# Patient Record
Sex: Female | Born: 1985
Health system: Southern US, Community
[De-identification: ages and names within clinical notes are randomized; demographics above are authoritative.]

## PROBLEM LIST (undated history)

## (undated) DIAGNOSIS — E119 Type 2 diabetes mellitus without complications: Secondary | ICD-10-CM

## (undated) DIAGNOSIS — A6000 Herpesviral infection of urogenital system, unspecified: Secondary | ICD-10-CM

## (undated) DIAGNOSIS — F32A Depression, unspecified: Secondary | ICD-10-CM

## (undated) DIAGNOSIS — R87619 Unspecified abnormal cytological findings in specimens from cervix uteri: Secondary | ICD-10-CM

## (undated) DIAGNOSIS — N12 Tubulo-interstitial nephritis, not specified as acute or chronic: Secondary | ICD-10-CM

## (undated) DIAGNOSIS — R011 Cardiac murmur, unspecified: Secondary | ICD-10-CM

## (undated) DIAGNOSIS — A5609 Other chlamydial infection of lower genitourinary tract: Secondary | ICD-10-CM

## (undated) DIAGNOSIS — T7840XA Allergy, unspecified, initial encounter: Secondary | ICD-10-CM

## (undated) DIAGNOSIS — I10 Essential (primary) hypertension: Secondary | ICD-10-CM

## (undated) DIAGNOSIS — J45909 Unspecified asthma, uncomplicated: Secondary | ICD-10-CM

## (undated) DIAGNOSIS — F329 Major depressive disorder, single episode, unspecified: Secondary | ICD-10-CM

## (undated) DIAGNOSIS — A599 Trichomoniasis, unspecified: Secondary | ICD-10-CM

## (undated) DIAGNOSIS — K219 Gastro-esophageal reflux disease without esophagitis: Secondary | ICD-10-CM

## (undated) DIAGNOSIS — M1990 Unspecified osteoarthritis, unspecified site: Secondary | ICD-10-CM

## (undated) DIAGNOSIS — K589 Irritable bowel syndrome without diarrhea: Secondary | ICD-10-CM

## (undated) HISTORY — DX: Type 2 diabetes mellitus without complications: E11.9

## (undated) HISTORY — DX: Essential (primary) hypertension: I10

## (undated) HISTORY — PX: COLONOSCOPY WITH PROPOFOL: SHX5780

## (undated) HISTORY — DX: Irritable bowel syndrome, unspecified: K58.9

## (undated) HISTORY — PX: ESOPHAGOGASTRODUODENOSCOPY (EGD) WITH PROPOFOL: SHX5813

## (undated) HISTORY — DX: Depression, unspecified: F32.A

## (undated) HISTORY — PX: POLYPECTOMY: SHX5525

## (undated) HISTORY — DX: Trichomoniasis, unspecified: A59.9

## (undated) HISTORY — DX: Gastro-esophageal reflux disease without esophagitis: K21.9

## (undated) HISTORY — PX: NO PAST SURGERIES: SHX2092

## (undated) HISTORY — PX: WISDOM TOOTH EXTRACTION: SHX21

## (undated) HISTORY — DX: Unspecified abnormal cytological findings in specimens from cervix uteri: R87.619

## (undated) HISTORY — DX: Major depressive disorder, single episode, unspecified: F32.9

## (undated) HISTORY — DX: Herpesviral infection of urogenital system, unspecified: A60.00

## (undated) HISTORY — DX: Other chlamydial infection of lower genitourinary tract: A56.09

---

## 2007-04-15 ENCOUNTER — Emergency Department: Payer: Self-pay | Admitting: Emergency Medicine

## 2007-04-22 ENCOUNTER — Ambulatory Visit: Payer: Self-pay | Admitting: Obstetrics & Gynecology

## 2007-10-24 ENCOUNTER — Inpatient Hospital Stay: Payer: Self-pay

## 2015-12-03 DIAGNOSIS — R87619 Unspecified abnormal cytological findings in specimens from cervix uteri: Secondary | ICD-10-CM

## 2015-12-03 HISTORY — DX: Unspecified abnormal cytological findings in specimens from cervix uteri: R87.619

## 2016-01-10 ENCOUNTER — Encounter: Payer: Self-pay | Admitting: Obstetrics and Gynecology

## 2016-02-08 ENCOUNTER — Ambulatory Visit (INDEPENDENT_AMBULATORY_CARE_PROVIDER_SITE_OTHER): Payer: BLUE CROSS/BLUE SHIELD | Admitting: Obstetrics and Gynecology

## 2016-02-08 ENCOUNTER — Encounter: Payer: Self-pay | Admitting: Obstetrics and Gynecology

## 2016-02-08 VITALS — BP 131/85 | HR 83 | Ht 66.0 in | Wt 221.9 lb

## 2016-02-08 DIAGNOSIS — R87610 Atypical squamous cells of undetermined significance on cytologic smear of cervix (ASC-US): Secondary | ICD-10-CM | POA: Diagnosis not present

## 2016-02-08 DIAGNOSIS — Z8619 Personal history of other infectious and parasitic diseases: Secondary | ICD-10-CM | POA: Insufficient documentation

## 2016-02-08 DIAGNOSIS — N939 Abnormal uterine and vaginal bleeding, unspecified: Secondary | ICD-10-CM | POA: Diagnosis not present

## 2016-02-08 DIAGNOSIS — R87810 Cervical high risk human papillomavirus (HPV) DNA test positive: Secondary | ICD-10-CM

## 2016-02-08 DIAGNOSIS — Z720 Tobacco use: Secondary | ICD-10-CM | POA: Diagnosis not present

## 2016-02-08 NOTE — Patient Instructions (Signed)
1. Smoking cessation encouraged 2. Condom use encouraged 3. Return in 3 weeks for follow-up on colposcopic directed biopsies    Colposcopy, Care After Refer to this sheet in the next few weeks. These instructions provide you with information on caring for yourself after your procedure. Your health care provider may also give you more specific instructions. Your treatment has been planned according to current medical practices, but problems sometimes occur. Call your health care provider if you have any problems or questions after your procedure. WHAT TO EXPECT AFTER THE PROCEDURE  After your procedure, it is typical to have the following:  Cramping. This often goes away in a few minutes.  Soreness. This may last for 2 days.  Lightheadedness. Lie down for a few minutes if this occurs. You may also have some bleeding or dark discharge for a few days. You may need to wear a sanitary pad during this time. HOME CARE INSTRUCTIONS  Avoid sex, douching, and using tampons for 3 days or as directed by your health care provider.  Only take over-the-counter or prescription medicines as directed by your health care provider. Do not take aspirin because it can cause bleeding.  Continue to take birth control pills if you are on them.  Not all test results are available during your visit. If your test results are not back during the visit, make an appointment with your health care provider to find out the results. Do not assume everything is normal if you have not heard from your health care provider or the medical facility. It is important for you to follow up on all of your test results.  Follow your health care provider's advice regarding activity, follow-up visits, and follow-up Pap tests. SEEK MEDICAL CARE IF:  You develop a rash.  You have problems with your medicine. SEEK IMMEDIATE MEDICAL CARE IF:  You are bleeding heavily or are passing blood clots.  You have a fever.  You have  abnormal vaginal discharge.  You are having cramps that do not go away after taking your pain medicine.  You feel lightheaded, dizzy, or faint.  You have stomach pain.   This information is not intended to replace advice given to you by your health care provider. Make sure you discuss any questions you have with your health care provider.   Document Released: 06/10/2013 Document Reviewed: 06/10/2013 Elsevier Interactive Patient Education [employer].

## 2016-02-08 NOTE — Progress Notes (Signed)
GYN ENCOUNTER NOTE  Subjective:       Brandi [name] is a 30 y.o. G17P1001 female is here for gynecologic evaluation of the following issues:  1. [facility] referral ASCUS Pap/positive high risk HPV  30 year old white female para 1001, using Nexplanon for contraception and now also on a low-dose birth control pill for attempt at cycle regulation, presents in referral for evaluation of abnormal Pap smear. Patient has been monogamous with the same partner for over one year. Patient does have past history of genital herpes, chlamydia, and Trichomonas. Patient has had 3 outbreaks of genital herpes since being diagnosed. She does not take acyclovir continuously.  Patient is a smoker for the past 5 years. .     Gynecologic History Patient's last menstrual period was 01/23/2016 (approximate). Contraception: OCP (estrogen/progesterone) and Nexplanon Last Pap: ASCUS/positive Pap Last mammogram: N/A  Obstetric History OB History  Gravida Para Term Preterm AB SAB TAB Ectopic Multiple Living  1 1 1       1     # Outcome Date GA Lbr Len/2nd Weight Sex Delivery Anes PTL Lv  1 Term 2009   8 lb 4.8 oz (3.765 kg) M Vag-Spont   Y      Past Medical History  Diagnosis Date  . Abnormal Pap smear of cervix 12/2015    ascus/pos- achd  . Genital HSV   . Chlamydial cervicitis   . Trichomoniasis     Past Surgical History  Procedure Laterality Date  . No past surgeries      No current outpatient prescriptions on file prior to visit.   No current facility-administered medications on file prior to visit.    Allergies  Allergen Reactions  . Amoxicillin Nausea And Vomiting and Rash    Social History   Social History  . Marital Status: Single    Spouse Name: N/A  . Number of Children: N/A  . Years of Education: N/A   Occupational History  . Not on file.   Social History Main Topics  . Smoking status: Current Every Day Smoker -- 1.00 packs/day    Types:  Cigarettes  . Smokeless tobacco: Not on file  . Alcohol Use: Yes     Comment: rare  . Drug Use: No  . Sexual Activity: Yes     Comment: nexplanon- 06/2013   Other Topics Concern  . Not on file   Social History Narrative  . No narrative on file    Family History  Problem Relation Age of Onset  . Diabetes Mother   . Heart disease Father   . Heart disease Maternal Grandfather   . Heart disease Paternal Grandfather   . Cancer Neg Hx     The following portions of the patient's history were reviewed and updated as appropriate: allergies, current medications, past family history, past medical history, past social history, past surgical history and problem list.  Review of Systems: Positive for abnormal uterine bleeding  Objective:   BP 131/85 mmHg  Pulse 83  Ht 5\' 6"  (1.676 m)  Wt 221 lb 14.4 oz (100.653 kg)  BMI 35.83 kg/m2  LMP 01/23/2016 (Approximate) CONSTITUTIONAL: Well-developed, well-nourished female in no acute distress.  HENT:  Normocephalic, atraumatic.  NECK: Not examined  SKIN: Skin is warm and dry. No rash noted. Not diaphoretic. No erythema. No pallor. [place]: Alert and oriented to person, place, and time. PSYCHIATRIC: Normal mood and affect. Normal behavior. Normal judgment and thought content. CARDIOVASCULAR:Not Examined RESPIRATORY: Not Examined BREASTS: Not  Examined ABDOMEN: Soft, non distended; Non tender.  No Organomegaly. PELVIC:  External Genitalia: Normal  BUS: Normal  Vagina: Normal  Cervix: Normal; no gross lesions  Uterus: Not examined  Adnexa: Not examined  RV: Normal external exam  Bladder: Nontender MUSCULOSKELETAL: Not examined  PROCEDURE: Colposcopy of upper vagina, cervix, and biopsies with ECC SCJ was not fully visualized with endocervical speculum Abnormal findings:  Acetowhite epithelium at 7:00 versus squamous metaplasia Biopsies:  ECC  Cervix biopsy 7:00 Description: Pt is placed in the dorsal lithotomy position. A  Graves' speculum is inserted into the vagina. Upper vagina and cervix is swabbed with acetic acid solution. The colposcope was used to identify abnormal lesions. ECC and cervical biopsy at 7:00 or completed. Monsel solution is applied to biopsy site for hemostasis. Minimal bleeding is encountered. Procedure was well-tolerated.    Assessment:   1. ASCUS/positive Pap smear 2. History of genital herpes 3. Abnormal uterine bleeding 4. Tobacco user   Plan:   1. Colposcopy with biopsies as noted 2. Smoking cessation encouraged 3. Condom use encouraged 4. Return in 3 weeks for follow-up on biopsies and further management planning  A total of 15 minutes were spent face-to-face with the patient during this encounter and over half of that time dealt with counseling and coordination of care.  Brayton Mars, MD  Note: This dictation was prepared with Dragon dictation along with smaller phrase technology. Any transcriptional errors that result from this process are unintentional.

## 2016-02-10 LAB — PATHOLOGY

## 2016-02-20 ENCOUNTER — Telehealth: Payer: Self-pay | Admitting: Obstetrics and Gynecology

## 2016-02-20 NOTE — Telephone Encounter (Signed)
Pt states that 1 week ago she had a thick, yellow/white, foul odor d/c. She has not noticed it recently. She did start her cycle today. NO itching or redness. Advised pt we could not look at secretions while she is on her cycle. Advised to keep appt on 6/29. If sx gets worse to move appt up.

## 2016-02-20 NOTE — Telephone Encounter (Signed)
Pt had a colposcopy 2 wks ago, this past wk has been having ta abnormal discharge, yellow and thick and it started 1 wk after colpo

## 2016-03-01 ENCOUNTER — Ambulatory Visit (INDEPENDENT_AMBULATORY_CARE_PROVIDER_SITE_OTHER): Payer: BLUE CROSS/BLUE SHIELD | Admitting: Obstetrics and Gynecology

## 2016-03-01 ENCOUNTER — Encounter: Payer: Self-pay | Admitting: Obstetrics and Gynecology

## 2016-03-01 VITALS — BP 131/83 | HR 92 | Ht 65.0 in | Wt 218.9 lb

## 2016-03-01 DIAGNOSIS — R87810 Cervical high risk human papillomavirus (HPV) DNA test positive: Secondary | ICD-10-CM | POA: Diagnosis not present

## 2016-03-01 DIAGNOSIS — R87610 Atypical squamous cells of undetermined significance on cytologic smear of cervix (ASC-US): Secondary | ICD-10-CM | POA: Diagnosis not present

## 2016-03-01 DIAGNOSIS — Z720 Tobacco use: Secondary | ICD-10-CM | POA: Diagnosis not present

## 2016-03-01 NOTE — Patient Instructions (Signed)
1. Return in 6 months for Pap smear 2. Quit smoking 3. Condom use encouraged   Human Papillomavirus Human papillomavirus (HPV) is the most common sexually transmitted infection (STI) and is highly contagious. HPV infections cause genital warts and cancers to the outlet of the womb (cervix), birth canal (vagina), opening of the birth canal (vulva), and anus. There are over 100 types of HPV. Unless wartlike lesions are present in the throat or there are genital warts that you can see or feel, HPV usually does not cause symptoms. It is possible to be infected for long periods and pass it on to others without knowing it. CAUSES  HPV is spread from person to person through sexual contact. This includes oral, vaginal, or anal sex. RISK FACTORS  Having unprotected sex. HPV can be spread by oral, vaginal, or anal sex.  Having several sex partners.  Having a sex partner who has other sex partners.  Having or having had another sexually transmitted infection. SIGNS AND SYMPTOMS  Most people carrying HPV do not have any symptoms. If symptoms are present, symptoms may include:  Wartlike lesions in the throat (from having oral sex).  Warts in the infected skin or mucous membranes.  Genital warts that may itch, burn, or bleed.  Genital warts that may be painful or bleed during sexual intercourse. DIAGNOSIS  If wartlike lesions are present in the throat or genital warts are present, your health care provider can usually diagnose HPV by physical examination.   Genital warts are easily seen with the naked eye.  Currently, there is no FDA-approved test to detect HPV in males.  In females, a Pap test can show cells that are infected with HPV.  In females, a scope can be used to view the cervix (colposcopy). A colposcopy can be performed if the pelvic exam or Pap test is abnormal. A sample of tissue may be removed (biopsy) during the colposcopy. TREATMENT  There is no treatment for the virus  itself. However, there are treatments for the health problems and symptoms HPV can cause. Your health care provider will follow you closely after you are treated. This is because the HPV can come back and may need treatment again. Treatment of HPV may include:   Medicines, which may be injected or applied in a cream, lotion, or gel form.  Use of a probe to apply extreme cold (cryotherapy).  Application of an intense beam of light (laser treatment).  Use of a probe to apply extreme heat (electrocautery).  Surgery. HOME CARE INSTRUCTIONS   Take medicines only as directed by your health care provider.  Use over-the-counter creams for itching or irritation as directed by your health care provider.  Keep all follow-up visits as directed by your health care provider. This is important.  Do not touch or scratch the warts.  Do not treat genital warts with medicines used for treating hand warts.  Do not have sex while you are being treated.  Do not douche or use tampons during treatment of HPV.  Tell your sex partner about your infection because he or she may also need treatment.  If you become pregnant, tell your health care provider that you have had HPV. Your health care provider will monitor you closely during pregnancy to be sure your baby is safe.  After treatment, use condoms during sex to prevent future infections.  Have only one sex partner.  Have a sex partner who does not have other sex partners. PREVENTION   Talk to  your health care provider about getting the HPV vaccines. These vaccines prevent some HPV infections and cancers. It is recommended that the vaccine be given to males and females between the ages of 32 and 60 years old. It will not work if you already have HPV, and it is not recommended for pregnant women.  A Pap test is done to screen for cervical cancer in women.  The first Pap test should be done at age 35 years.  Between ages 57 and 39 years, Pap tests  are repeated every 2 years.  Beginning at age 43, you are advised to have a Pap test every 3 years as long as your past 3 Pap tests have been normal.  Some women have medical problems that increase the chance of getting cervical cancer. Talk to your health care provider about these problems. It is especially important to talk to your health care provider if a new problem develops soon after your last Pap test. In these cases, your health care provider may recommend more frequent screening and Pap tests.  The above recommendations are the same for women who have or have not gotten the vaccine for HPV.  If you had a hysterectomy for a problem that was not a cancer or a condition that could lead to cancer, then you no longer need Pap tests. However, even if you no longer need a Pap test, a regular exam is a good idea to make sure no other problems are starting.   If you are between the ages of 79 and 27 years and you have had normal Pap tests going back 10 years, you no longer need Pap tests. However, even if you no longer need a Pap test, a regular exam is a good idea to make sure no other problems are starting.  If you have had past treatment for cervical cancer or a condition that could lead to cancer, you need Pap tests and screening for cancer for at least 20 years after your treatment.  If Pap tests have been discontinued, risk factors (such as a new sexual partner)need to be reassessed to determine if screening should be resumed.  Some women may need screenings more often if they are at high risk for cervical cancer. SEEK MEDICAL CARE IF:   The treated skin becomes red, swollen, or painful.  You have a fever.  You feel generally ill.  You feel lumps or pimple-like projections in and around your genital area.  You develop bleeding of the vagina or the treatment area.  You have painful sexual intercourse. MAKE SURE YOU:   Understand these instructions.  Will watch your  condition.  Will get help if you are not doing well or get worse.   This information is not intended to replace advice given to you by your health care provider. Make sure you discuss any questions you have with your health care provider.   Document Released: 11/10/2003 Document Revised: 09/10/2014 Document Reviewed: 11/25/2013 Elsevier Interactive Patient Education [employer].

## 2016-03-01 NOTE — Progress Notes (Signed)
Chief complaint: 1. Follow-up on colposcopic directed biopsies 2. History of ASCUS Pap/positive high risk HPV 3. Tobacco user  Patient presents for colposcopic directed biopsies.  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 ASCUS Pap/positive high risk HPV 2. Colposcopic biopsies demonstrate koilocytosis only 3. Tobacco user  PLAN: 1. Findings reviewed and explained 2. Encouraged tobacco cessation 3. Encourage condom use 4. Return in 6 months for repeat Pap smear/HPV testing  A total of 15 minutes were spent face-to-face with the patient during this encounter and over half of that time dealt with counseling and coordination of care.   Brayton Mars, MD  Note: This dictation was prepared with Dragon dictation along with smaller phrase technology. Any transcriptional errors that result from this process are unintentional.

## 2016-08-23 ENCOUNTER — Encounter: Payer: Self-pay | Admitting: Obstetrics and Gynecology

## 2016-08-23 ENCOUNTER — Ambulatory Visit (INDEPENDENT_AMBULATORY_CARE_PROVIDER_SITE_OTHER): Payer: Self-pay | Admitting: Obstetrics and Gynecology

## 2016-08-23 VITALS — BP 119/79 | HR 94 | Ht 65.0 in | Wt 233.1 lb

## 2016-08-23 DIAGNOSIS — R87610 Atypical squamous cells of undetermined significance on cytologic smear of cervix (ASC-US): Secondary | ICD-10-CM

## 2016-08-23 DIAGNOSIS — R87810 Cervical high risk human papillomavirus (HPV) DNA test positive: Secondary | ICD-10-CM

## 2016-08-23 NOTE — Patient Instructions (Signed)
1. Pap smear is performed 2. Consider using NuvaRing for contraception. Return to health department for further management

## 2016-08-23 NOTE — Progress Notes (Signed)
Chief complaint: 1. ASCUS Pap smear 2. 6 month interval Pap   Patient is a smoker. History of HSV, chlamydia, Trichomonas Contraception: Nexplanon and OCPs concurrent. Patient reports recent stopping of OCPs because of inability to remember to take them. I recommended she consider using the NuvaRing. She will be going back to the health department for further management. Patient has been experiencing low back pain and central cramping with most recent menses. I have recommended that she monitor symptoms over the next 3-4 cycles and take Advil as needed. If symptoms persist, she may return for follow-up.   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 History of high risk HPV; last Pap smear ASCUS; colposcopy biopsies notable for HPV effect only 2. Tobacco user  PLAN: 1. Pap smear 2. Smoking cessation encouraged 3. Consider NuvaRing for contraception. Patient to return to health department for Nexplanon removal and further management of contraception  Brayton Mars, MD  Note: This dictation was prepared with Dragon dictation along with smaller phrase technology. Any transcriptional errors that result from this process are unintentional.

## 2016-08-30 LAB — PAP IG AND HPV HIGH-RISK
HPV, high-risk: NEGATIVE
PAP Smear Comment: 0

## 2018-09-25 DIAGNOSIS — I10 Essential (primary) hypertension: Secondary | ICD-10-CM | POA: Diagnosis not present

## 2018-10-21 DIAGNOSIS — Z3200 Encounter for pregnancy test, result unknown: Secondary | ICD-10-CM | POA: Diagnosis not present

## 2018-10-21 DIAGNOSIS — Z3161 Procreative counseling and advice using natural family planning: Secondary | ICD-10-CM | POA: Diagnosis not present

## 2018-10-21 DIAGNOSIS — Z3049 Encounter for surveillance of other contraceptives: Secondary | ICD-10-CM | POA: Diagnosis not present

## 2018-10-21 DIAGNOSIS — Z309 Encounter for contraceptive management, unspecified: Secondary | ICD-10-CM | POA: Diagnosis not present

## 2018-10-21 DIAGNOSIS — N760 Acute vaginitis: Secondary | ICD-10-CM | POA: Diagnosis not present

## 2018-10-22 ENCOUNTER — Other Ambulatory Visit: Payer: Self-pay

## 2018-10-22 ENCOUNTER — Ambulatory Visit (INDEPENDENT_AMBULATORY_CARE_PROVIDER_SITE_OTHER): Payer: BLUE CROSS/BLUE SHIELD | Admitting: Nurse Practitioner

## 2018-10-22 ENCOUNTER — Encounter: Payer: Self-pay | Admitting: Nurse Practitioner

## 2018-10-22 ENCOUNTER — Encounter: Payer: Self-pay | Admitting: *Deleted

## 2018-10-22 VITALS — BP 129/79 | HR 87 | Temp 97.9°F | Ht 65.0 in | Wt 233.2 lb

## 2018-10-22 DIAGNOSIS — K580 Irritable bowel syndrome with diarrhea: Secondary | ICD-10-CM

## 2018-10-22 DIAGNOSIS — K219 Gastro-esophageal reflux disease without esophagitis: Secondary | ICD-10-CM | POA: Insufficient documentation

## 2018-10-22 DIAGNOSIS — F3341 Major depressive disorder, recurrent, in partial remission: Secondary | ICD-10-CM

## 2018-10-22 DIAGNOSIS — I10 Essential (primary) hypertension: Secondary | ICD-10-CM | POA: Diagnosis not present

## 2018-10-22 DIAGNOSIS — K649 Unspecified hemorrhoids: Secondary | ICD-10-CM | POA: Insufficient documentation

## 2018-10-22 DIAGNOSIS — Z7689 Persons encountering health services in other specified circumstances: Secondary | ICD-10-CM

## 2018-10-22 MED ORDER — LISINOPRIL-HYDROCHLOROTHIAZIDE 10-12.5 MG PO TABS: 1.0000 | ORAL_TABLET | Freq: Every day | ORAL | 1 refills | Status: DC

## 2018-10-22 MED ORDER — HYDROCORTISONE 2.5 % RE CREA: 1.0000 "application " | TOPICAL_CREAM | Freq: Two times a day (BID) | RECTAL | 0 refills | Status: DC | PRN

## 2018-10-22 MED ORDER — OMEPRAZOLE 20 MG PO CPDR: 20.0000 mg | DELAYED_RELEASE_CAPSULE | Freq: Every day | ORAL | 1 refills | Status: DC

## 2018-10-22 MED ORDER — DULOXETINE HCL 30 MG PO CPEP: 30.0000 mg | ORAL_CAPSULE | Freq: Every day | ORAL | 1 refills | Status: DC

## 2018-10-22 NOTE — Progress Notes (Signed)
Subjective:    Patient ID: Brandi [name], female    DOB: 23-Mar-1986, 33 y.o.   MRN: [id number]  Brandi [name] is a 33 y.o. female presenting on 10/22/2018 for Establish Care (frequent bowel movement x 3-5 bm daily. Pt requesting referral to GI)   HPI Establish Care New Provider Pt last seen by PCP a few months ago at [facility]. Records will be requested.  She had Nexplanon removed for > 5 years since placement.  Now using Depo-Provera starting.    Diarrhea Frequent Bowel movements have been occurring 3-5 times daily for years.  Symptoms started in late teens to early 90s. Patient has known IBS and has had several attempts at symptom control with medications.  Patient has not had full/official diagnostic workup, but has had consistent symptoms of IBS for several years. Prior PCP recommended she consider going to a GI specialist. - Patient has used Bentyl in past with some relief, but not consistently - Patient uses Immodium x 5 tabs per dose with similar response as bentyl.  Used yesterday after 4 BM. This usually gives her about 24 hours relief. - Patient also has hemorrhoid that "will not heal" - external hemorrhoid.  Hemorrhoid bleeds regularly and always feels inflamed/irritated. Bleeding is occasionally very heavy. - Diarrhea associated symptoms include feeling "bubble guts,"  Bloating, abdominal cramping frequently.  Past Medical History:  Diagnosis Date  . Abnormal Pap smear of cervix 12/2015   ascus/pos- achd  . Chlamydial cervicitis   . Depression    pt has past treatment w/ Zoloft - good symptom control, but worsening IBS symptoms  . Genital HSV   . GERD (gastroesophageal reflux disease)   . Hypertension   . IBS (irritable bowel syndrome)   . Trichomoniasis    Past Surgical History:  Procedure Laterality Date  . NO PAST SURGERIES     Social History   Socioeconomic History  . Marital status: Single    Spouse name: Not on file  . Number of  children: 1  . Years of education: Not on file  . Highest education level: Some college, no degree  Occupational History  . Not on file  Social Needs  . Financial resource strain: Not very hard  . Food insecurity:    Worry: Never true    Inability: Never true  . Transportation needs:    Medical: No    Non-medical: No  Tobacco Use  . Smoking status: Current Every Day Smoker    Packs/day: 1.00    Years: 10.00    Pack years: 10.00    Types: Cigarettes  . Smokeless tobacco: Never Used  Substance and Sexual Activity  . Alcohol use: Yes    Alcohol/week: 1.0 standard drinks    Types: 1 Glasses of wine per week    Comment: rare  . Drug use: Yes    Frequency: 7.0 times per week    Types: Marijuana    Comment: once daily at night (pt admits is self-medication for depression/insomnia)  . Sexual activity: Yes    Birth control/protection: Injection  Lifestyle  . Physical activity:    Days per week: Not on file    Minutes per session: Not on file  . Stress: Not on file  Relationships  . Social connections:    Talks on phone: Not on file    Gets together: Not on file    Attends religious service: Not on file    Active member of club or organization:  Not on file    Attends meetings of clubs or organizations: Not on file    Relationship status: Not on file  . Intimate partner violence:    Fear of current or ex partner: No    Emotionally abused: No    Physically abused: No    Forced sexual activity: No  Other Topics Concern  . Not on file  Social History Narrative  . Not on file   Family History  Problem Relation Age of Onset  . Diabetes Mother   . Hypertension Mother   . Heart disease Father   . Heart attack Father   . Heart disease Maternal Grandfather   . Heart disease Paternal Grandfather   . Hypertension Sister   . Lung cancer Maternal Grandmother   . Lung cancer Paternal Grandmother   . ADD / ADHD Son   . Cancer Neg Hx    Current Outpatient Medications on File  Prior to Visit  Medication Sig  . Cholecalciferol (D3-1000) 25 MCG (1000 UT) tablet Take 1,000 Units by mouth daily.  . Multiple Vitamins-Minerals (WOMENS DAILY FORMULA PO) Take by mouth.  . vitamin B-12 (CYANOCOBALAMIN) 500 MCG tablet Take 500 mcg by mouth daily.   No current facility-administered medications on file prior to visit.     Review of Systems  Constitutional: Negative for activity change, appetite change and fatigue.  Respiratory: Negative for cough and shortness of breath.   Cardiovascular: Negative for chest pain, palpitations and leg swelling.  Gastrointestinal: Positive for anal bleeding (hemorrhoid) and diarrhea. Negative for constipation, nausea and vomiting.  Genitourinary: Negative for dysuria, frequency and urgency.  Musculoskeletal: Negative for arthralgias and myalgias.  Skin: Negative for rash.  Neurological: Negative for dizziness and headaches.  Psychiatric/Behavioral: Positive for sleep disturbance. Negative for dysphoric mood. The patient is not nervous/anxious.    Per HPI unless specifically indicated above    Objective:    BP 129/79 (BP Location: Right Arm, Patient Position: Sitting, Cuff Size: Large)   Pulse 87   Temp 97.9 F (36.6 C) (Oral)   Ht 5\' 5"  (1.651 m)   Wt 233 lb 3.2 oz (105.8 kg)   BMI 38.81 kg/m   Wt Readings from Last 3 Encounters:  10/22/18 233 lb 3.2 oz (105.8 kg)  08/23/16 233 lb 2 oz (105.7 kg)  03/01/16 218 lb 14.4 oz (99.3 kg)    Physical Exam Vitals signs reviewed.  Constitutional:      General: She is not in acute distress.    Appearance: She is well-developed.  HENT:     Head: Normocephalic and atraumatic.     Mouth/Throat:     Tonsils: No tonsillar exudate. Swelling: 1+ on the right. 1+ on the left.  Cardiovascular:     Rate and Rhythm: Normal rate and regular rhythm.     Pulses:          Radial pulses are 2+ on the right side and 2+ on the left side.       Posterior tibial pulses are 1+ on the right side and 1+  on the left side.     Heart sounds: Normal heart sounds, S1 normal and S2 normal.  Pulmonary:     Effort: Pulmonary effort is normal. No respiratory distress.     Breath sounds: Normal breath sounds and air entry.  Abdominal:     General: Abdomen is protuberant. Bowel sounds are normal. There is no distension.     Palpations: Abdomen is soft.  Tenderness: There is generalized abdominal tenderness. There is right CVA tenderness. There is no left CVA tenderness, guarding or rebound. Negative signs include Murphy's sign, McBurney's sign and psoas sign.     Hernia: No hernia is present.  Musculoskeletal:     Right lower leg: No edema.     Left lower leg: No edema.  Skin:    General: Skin is warm and dry.     Capillary Refill: Capillary refill takes less than 2 seconds.  Neurological:     Mental Status: She is alert and oriented to person, place, and time.  Psychiatric:        Attention and Perception: Attention normal.        Mood and Affect: Mood and affect normal.        Behavior: Behavior normal. Behavior is cooperative.    Results for orders placed or performed in visit on 08/23/16  Pap IG and HPV (high risk) DNA detection  Result Value Ref Range   DIAGNOSIS: Comment    Specimen adequacy: Comment    Clinician Provided ICD10 Comment    Performed by: Comment    QC reviewed by: Comment    PAP Smear Comment .    Note: Comment    Test Methodology Comment    HPV, high-risk Negative Negative      Assessment & Plan:   Problem List Items Addressed This Visit      Cardiovascular and Mediastinum   Hemorrhoids Chronic, complicated by IBS-D.   - Manage IBS per below - START anusol topically at rectum when hemorrhoid is flared/painful. - Follow-up with GI.   Relevant Medications   hydrocortisone (ANUSOL-HC) 2.5 % rectal cream   lisinopril-hydrochlorothiazide (PRINZIDE,ZESTORETIC) 10-12.5 MG tablet   Essential hypertension Controlled hypertension.  BP goal < 130/80.  Pt is not  currently working on lifestyle modifications.  Taking medications tolerating well without side effects.   Plan: 1. Continue taking lisinopril-HCTZ 10-12.5 mg once daily 2. Obtain labs CMP today  3. Encouraged heart healthy diet and increasing exercise to 30 minutes most days of the week. 4. Check BP 1-2 x per week at home, keep log, and bring to clinic at next appointment. 5. Follow up 3 months.     Relevant Medications   lisinopril-hydrochlorothiazide (PRINZIDE,ZESTORETIC) 10-12.5 MG tablet     Digestive   Irritable bowel syndrome with diarrhea - Primary Uncontrolled symptoms that interfere with daily life.  3-5 BM per day including bothersome symptoms.  Plan: 1. Continue GERD management 2. Reduce use of Immodium, reduce dose taken to recommended 1-2 tabs.  May need to consider resuming Bentyl instead of Immodium. 3. START using daily metamucil or Citrucel. 4. START IBgard daily in am with breakfast.  5. Referral to GI placed.  Patient may need workup to rule out IBD causes of symptoms since this has not been done in past. 6. Follow-up 3 months prn if not seen by GI.   Relevant Medications   omeprazole (PRILOSEC) 20 MG capsule   Other Relevant Orders   Ambulatory referral to Gastroenterology   Gastroesophageal reflux disease  Stable on ppi.  Continue current dose.  Refills provided. Follow-up prn.   Relevant Medications   omeprazole (PRILOSEC) 20 MG capsule     Other   Recurrent major depressive disorder, in partial remission (HCC) Stable, but with persistent insomnia symptoms.  Encouraged patient to consider use of sleep-promoting pharmaceuticals in future and reduce use of marijuana to improve GI symptoms.  Patient has had several meds for  sleep in past that do not help.  Will address again in future.  For now, continue duloxetine 30 mg once daily.  Follow-up 6 months.   Relevant Medications   DULoxetine (CYMBALTA) 30 MG capsule    Other Visit Diagnoses    Encounter to  establish care    Previous PCP was at [facility].  Records will be requested.  Past medical, family, and surgical history reviewed w/ pt.        Meds ordered this encounter  Medications  . hydrocortisone (ANUSOL-HC) 2.5 % rectal cream    Sig: Place 1 application rectally 2 (two) times daily as needed for hemorrhoids or anal itching.    Dispense:  30 g    Refill:  0    Order Specific Question:   Supervising Provider    Answer:   Olin Hauser [[id number]]  . omeprazole (PRILOSEC) 20 MG capsule    Sig: Take 1 capsule (20 mg total) by mouth daily.    Dispense:  90 capsule    Refill:  1    Fill when requested by patient    Order Specific Question:   Supervising Provider    Answer:   Olin Hauser [[id number]]  . DULoxetine (CYMBALTA) 30 MG capsule    Sig: Take 1 capsule (30 mg total) by mouth daily.    Dispense:  90 capsule    Refill:  1    Order Specific Question:   Supervising Provider    Answer:   Olin Hauser [[id number]]  . lisinopril-hydrochlorothiazide (PRINZIDE,ZESTORETIC) 10-12.5 MG tablet    Sig: Take 1 tablet by mouth daily.    Dispense:  90 tablet    Refill:  1    Order Specific Question:   Supervising Provider    Answer:   Olin Hauser [[id number]]    Follow up plan: Return in about 3 months (around 01/20/2019) for IBS if not seen by GI.  Cassell Smiles, DNP, AGPCNP-BC Adult Gerontology Primary Care Nurse Practitioner [facility] 10/22/2018, 10:50 AM

## 2018-10-22 NOTE — Patient Instructions (Addendum)
Brandi [name],   Thank you for coming in to clinic today.  1. START Metamucil or Citrucel for increasing fiber/bulk in your stool.   - take 1/2 dose for 1 week.  Increase to 1 full dose daily as tolerated to create more formed stool.  2. May also start IBgard to help with increased diarrhea symptoms.   3. Keep a log of GI symptoms to track improvements over time.  Please schedule a follow-up appointment with Cassell Smiles, AGNP. Return in about 3 months (around 01/20/2019) for IBS if not seen by GI.  If you have any other questions or concerns, please feel free to call the clinic or send a message through [place]. You may also schedule an earlier appointment if necessary.  You will receive a survey after today's visit either digitally by e-mail or paper by [employer]. Your experiences and feedback matter to [place].  Please respond so we know how we are doing as we provide care for you.   Cassell Smiles, DNP, AGNP-BC Adult Gerontology Nurse Practitioner [place]

## 2018-10-24 DIAGNOSIS — J029 Acute pharyngitis, unspecified: Secondary | ICD-10-CM | POA: Diagnosis not present

## 2018-10-24 DIAGNOSIS — J101 Influenza due to other identified influenza virus with other respiratory manifestations: Secondary | ICD-10-CM | POA: Diagnosis not present

## 2018-10-24 DIAGNOSIS — R6889 Other general symptoms and signs: Secondary | ICD-10-CM | POA: Diagnosis not present

## 2018-11-07 DIAGNOSIS — F331 Major depressive disorder, recurrent, moderate: Secondary | ICD-10-CM | POA: Diagnosis not present

## 2018-11-14 ENCOUNTER — Encounter: Payer: Self-pay | Admitting: Gastroenterology

## 2018-11-14 ENCOUNTER — Ambulatory Visit: Payer: BLUE CROSS/BLUE SHIELD | Admitting: Gastroenterology

## 2018-11-14 ENCOUNTER — Other Ambulatory Visit: Payer: Self-pay

## 2018-11-14 VITALS — BP 118/80 | HR 109 | Ht 65.0 in | Wt 229.0 lb

## 2018-11-14 DIAGNOSIS — K644 Residual hemorrhoidal skin tags: Secondary | ICD-10-CM | POA: Diagnosis not present

## 2018-11-14 DIAGNOSIS — K580 Irritable bowel syndrome with diarrhea: Secondary | ICD-10-CM | POA: Diagnosis not present

## 2018-11-14 MED ORDER — AMITRIPTYLINE HCL 25 MG PO TABS: 25.0000 mg | ORAL_TABLET | Freq: Every day | ORAL | 0 refills | Status: DC

## 2018-11-14 MED ORDER — DICYCLOMINE HCL 10 MG PO CAPS: 10.0000 mg | ORAL_CAPSULE | Freq: Three times a day (TID) | ORAL | 0 refills | Status: DC

## 2018-11-14 NOTE — Progress Notes (Addendum)
Brandi Darby, MD [address]  [place]  [place], [place] [address]  Main: [phone]  Fax: [phone]    Gastroenterology Consultation  Referring Provider:     Mikey College, * Primary Care Physician:  Mikey College, NP Primary Gastroenterologist:  Dr. Cephas [name] Reason for Consultation:     IBS diarrhea        HPI:   Brandi [name] is a 33 y.o. female referred by Dr. Merrilyn Puma, Jerrel Ivory, NP  for consultation & management of several years history of abdominal cramps, nonbloody diarrhea and bloating.  She has been dealing with these symptoms for several years, tried Imodium in the past, diarrhea temporarily improved.  Dicyclomine helps to some extent.  She tried IBgard which does provide symptom relief.  She gained weight in last few years as well as going through significant anxiety and depression.  She is currently treated with Cymbalta.  She consumes sweetened tea, carbonated beverages regularly.  Her symptoms are almost on a daily basis significantly impacting her personal and professional life, losing at least 1 hour/week of her work time.  She is worried about losing her job due to frequent breaks she has to take to use bathroom.  She is also complaining of rectal discomfort, perianal itching and irritation from her hemorrhoids due to increased bowel frequency  He is also requesting to fill out the paperwork to submit to her workplace which can accommodate at least an hour per week  for frequent clock out  She smokes cigarettes She denies drinking alcohol  NSAIDs: None  Antiplts/Anticoagulants/Anti thrombotics: None  GI Procedures: None She denies family history of celiac disease, GI malignancy, inflammatory bowel disease  Past Medical History:  Diagnosis Date  . Abnormal Pap smear of cervix 12/2015   ascus/pos- achd  . Chlamydial cervicitis   . Depression    pt has past treatment w/ Zoloft - good symptom control, but worsening IBS  symptoms  . Genital HSV   . GERD (gastroesophageal reflux disease)   . Hypertension   . IBS (irritable bowel syndrome)   . Trichomoniasis     Past Surgical History:  Procedure Laterality Date  . NO PAST SURGERIES     Current Outpatient Medications:  .  Cholecalciferol (D3-1000) 25 MCG (1000 UT) tablet, Take 1,000 Units by mouth daily., Disp: , Rfl:  .  DULoxetine (CYMBALTA) 30 MG capsule, Take 1 capsule (30 mg total) by mouth daily., Disp: 90 capsule, Rfl: 1 .  hydrocortisone (ANUSOL-HC) 2.5 % rectal cream, Place 1 application rectally 2 (two) times daily as needed for hemorrhoids or anal itching., Disp: 30 g, Rfl: 0 .  lisinopril-hydrochlorothiazide (PRINZIDE,ZESTORETIC) 10-12.5 MG tablet, Take 1 tablet by mouth daily., Disp: 90 tablet, Rfl: 1 .  Multiple Vitamins-Minerals (WOMENS DAILY FORMULA PO), Take by mouth., Disp: , Rfl:  .  omeprazole (PRILOSEC) 20 MG capsule, Take 1 capsule (20 mg total) by mouth daily., Disp: 90 capsule, Rfl: 1 .  vitamin B-12 (CYANOCOBALAMIN) 500 MCG tablet, Take 500 mcg by mouth daily., Disp: , Rfl:  .  amitriptyline (ELAVIL) 25 MG tablet, Take 1 tablet (25 mg total) by mouth at bedtime for 30 days., Disp: 30 tablet, Rfl: 0   Family History  Problem Relation Age of Onset  . Diabetes Mother   . Hypertension Mother   . Heart disease Father   . Heart attack Father   . Heart disease Maternal Grandfather   . Heart disease Paternal Grandfather   .  Hypertension Sister   . Lung cancer Maternal Grandmother   . Lung cancer Paternal Grandmother   . ADD / ADHD Son   . Cancer Neg Hx      Social History   Tobacco Use  . Smoking status: Current Every Day Smoker    Packs/day: 1.00    Years: 10.00    Pack years: 10.00    Types: Cigarettes  . Smokeless tobacco: Never Used  Substance Use Topics  . Alcohol use: Yes    Alcohol/week: 1.0 standard drinks    Types: 1 Glasses of wine per week    Comment: rare  . Drug use: Yes    Frequency: 7.0 times per week     Types: Marijuana    Comment: once daily at night (pt admits is self-medication for depression/insomnia)    Allergies as of 11/14/2018 - Review Complete 11/14/2018  Allergen Reaction Noted  . Amoxicillin Nausea And Vomiting and Rash 02/08/2016    Review of Systems:    All systems reviewed and negative except where noted in HPI.   Physical Exam:  BP 118/80   Pulse (!) 109   Ht 5\' 5"  (1.651 m)   Wt 229 lb (103.9 kg)   BMI 38.11 kg/m  No LMP recorded.  General:   Alert,  Well-developed, well-nourished, pleasant and cooperative in NAD Head:  Normocephalic and atraumatic. Eyes:  Sclera clear, no icterus.   Conjunctiva pink. Ears:  Normal auditory acuity. Nose:  No deformity, discharge, or lesions. Mouth:  No deformity or lesions,oropharynx pink & moist. Neck:  Supple; no masses or thyromegaly. Lungs:  Respirations even and unlabored.  Clear throughout to auscultation.   No wheezes, crackles, or rhonchi. No acute distress. Heart:  Regular rate and rhythm; no murmurs, clicks, rubs, or gallops. Abdomen:  Normal bowel sounds. Soft, obese, non-tender and non-distended without masses, hepatosplenomegaly or hernias noted.  No guarding or rebound tenderness.   Rectal: Not performed Msk:  Symmetrical without gross deformities. Good, equal movement & strength bilaterally. Pulses:  Normal pulses noted. Extremities:  No clubbing or edema.  No cyanosis. Neurologic:  Alert and oriented x3;  grossly normal neurologically. Skin:  Intact without significant lesions or rashes. No jaundice. Psych:  Alert and cooperative. Normal mood and affect.  Imaging Studies: No abdominal imaging  Assessment and Plan:   Brandi [name] is a 33 y.o. female with obesity, anxiety and depression, several years history of abdominal cramps, nonbloody diarrhea, bloating without any constitutional symptoms.  Her symptoms are consistent with diarrhea predominant IBS.  Symptoms are worse secondary to combination of  intake of carbonated beverages, artificial sweeteners as well as anxiety  Strongly advised to avoid carbonated beverages and artificial sweeteners Trial of amitriptyline 25 mg at bedtime along with duloxetine Restart Bentyl 10 mg before each meal and at bedtime Trial of IBgard as needed, patient finds it expensive Check celiac serologies  Symptomatic hemorrhoids, nonbleeding Discussed with her about controlling her IBS symptoms before proceeding with hemorrhoid ligation   Follow up in 4 weeks   Brandi Darby, MD

## 2018-11-20 DIAGNOSIS — F331 Major depressive disorder, recurrent, moderate: Secondary | ICD-10-CM | POA: Diagnosis not present

## 2018-11-28 ENCOUNTER — Other Ambulatory Visit: Payer: Self-pay

## 2018-11-28 ENCOUNTER — Ambulatory Visit (INDEPENDENT_AMBULATORY_CARE_PROVIDER_SITE_OTHER): Payer: BLUE CROSS/BLUE SHIELD | Admitting: Nurse Practitioner

## 2018-11-28 ENCOUNTER — Encounter: Payer: Self-pay | Admitting: Nurse Practitioner

## 2018-11-28 DIAGNOSIS — N3000 Acute cystitis without hematuria: Secondary | ICD-10-CM | POA: Diagnosis not present

## 2018-11-28 MED ORDER — CEPHALEXIN 500 MG PO CAPS: 500.0000 mg | ORAL_CAPSULE | Freq: Three times a day (TID) | ORAL | 0 refills | Status: AC

## 2018-11-28 NOTE — Progress Notes (Signed)
Subjective:    Patient ID: Brandi [name], female    DOB: 11-10-1985, 33 y.o.   MRN: [id number]  Brandi [name] is a 33 y.o. female presenting on 11/28/2018 for Urinary Tract Infection (frequent urination, dysuria at the end of the stream, urinary urgency x 1 day )   HPI UTI symptoms Patient was having increased urinary frequency yesterday (1 day ago).  Overnight started having some pelvic pain, today is worse.  Dysuria through urination.  Now has urgency, but very small voids today. Is having some pelvic discomfort, periodic back pain.  - Is having some flashes hot to cold more in response to pain.  NO chills, sweats, fever. - Notes no vaginal symptoms of discharge, odor. - Has not taken anything so far for her symptoms.   Social History   Tobacco Use  . Smoking status: Current Every Day Smoker    Packs/day: 1.00    Years: 10.00    Pack years: 10.00    Types: Cigarettes  . Smokeless tobacco: Never Used  Substance Use Topics  . Alcohol use: Yes    Alcohol/week: 1.0 standard drinks    Types: 1 Glasses of wine per week    Comment: rare  . Drug use: Yes    Frequency: 7.0 times per week    Types: Marijuana    Comment: once daily at night (pt admits is self-medication for depression/insomnia)    Review of Systems Per HPI unless specifically indicated above     Objective:    There were no vitals taken for this visit.  Wt Readings from Last 3 Encounters:  11/14/18 229 lb (103.9 kg)  10/22/18 233 lb 3.2 oz (105.8 kg)  08/23/16 233 lb 2 oz (105.7 kg)    Physical Exam Patient remotely monitored.  Verbal communication appropriate.  Cognition normal. Mood normal. Results for orders placed or performed in visit on 08/23/16  Pap IG and HPV (high risk) DNA detection  Result Value Ref Range   DIAGNOSIS: Comment    Specimen adequacy: Comment    Clinician Provided ICD10 Comment    Performed by: Comment    QC reviewed by: Comment    PAP Smear Comment .    Note: Comment     Test Methodology Comment    HPV, high-risk Negative Negative      Assessment & Plan:   Problem List Items Addressed This Visit    None    Visit Diagnoses    Acute cystitis without hematuria    -  Primary   Relevant Medications   cephALEXin (KEFLEX) 500 MG capsule    Acute cystitis without hematuria as we cannot assess this without urine sample.  Pt symptomatic currently with increased suprapubic pressure x 2 days. Currently without systemic signs or symptoms of infection.   - No current risk of concurrent STI.  Plan: 1. START Keflex 500mg  3 times daily for next 5 days.   - Requested patient follow-up if needed for persistent symptoms to get urine sample  2. Provided non-pharm measures for UTI prevention for good hygiene. 3. Drink plenty of fluids and improve hydration over next 1 week. 4. Provided precautions for severe symptoms requiring ED visit to include no urine in 24-48 hours. 5. Followup 2-5 days as needed for worsening or persistent symptoms.    Meds ordered this encounter  Medications  . cephALEXin (KEFLEX) 500 MG capsule    Sig: Take 1 capsule (500 mg total) by mouth 3 (three) times daily for 5  days.    Dispense:  15 capsule    Refill:  0    Order Specific Question:   Supervising Provider    Answer:   Olin Hauser [[id number]]   Disclosed to patient at start of encounter that we will bill telephone services and she will receive bill of services provided.  Patient consents to be treated via phone prior to discussion. - Patient is at her home and is accessed via telephone. - Services are provided from [facility], [facility]. - Time spent in direct consultation with patient on phone: 6 minutes  Follow up plan: Return 5-7 days if symptoms worsen or fail to improve.  Cassell Smiles, DNP, AGPCNP-BC Adult Gerontology Primary Care Nurse Practitioner [facility] 11/28/2018, 1:40 PM

## 2018-11-28 NOTE — Patient Instructions (Addendum)
Brandi [name],   Thank you for coming in to clinic today.  1. START Keflex 500 mg capsule.  Take one by mouth three times daily for 5 days.  Please schedule a follow-up appointment with Cassell Smiles, AGNP. Return 5-7 days if symptoms worsen or fail to improve.  If you have any other questions or concerns, please feel free to call the clinic or send a message through [place]. You may also schedule an earlier appointment if necessary.  You will receive a survey after today's visit either digitally by e-mail or paper by [employer]. Your experiences and feedback matter to [place].  Please respond so we know how we are doing as we provide care for you.   Cassell Smiles, DNP, AGNP-BC Adult Gerontology Nurse Practitioner [facility], [facility]   Urinary Tract Infection, Adult  A urinary tract infection (UTI) is an infection of any part of the urinary tract. The urinary tract includes the kidneys, ureters, bladder, and urethra. These organs make, store, and get rid of urine in the body. Your health care provider may use other names to describe the infection. An upper UTI affects the ureters and kidneys (pyelonephritis). A lower UTI affects the bladder (cystitis) and urethra (urethritis). What are the causes? Most urinary tract infections are caused by bacteria in your genital area, around the entrance to your urinary tract (urethra). These bacteria grow and cause inflammation of your urinary tract. What increases the risk? You are more likely to develop this condition if:  You have a urinary catheter that stays in place (indwelling).  You are not able to control when you urinate or have a bowel movement (you have incontinence).  You are female and you: ? Use a spermicide or diaphragm for birth control. ? Have low estrogen levels. ? Are pregnant.  You have certain genes that increase your risk (genetics).  You are sexually active.  You take antibiotic medicines.  You have  a condition that causes your flow of urine to slow down, such as: ? An enlarged prostate, if you are female. ? Blockage in your urethra (stricture). ? A kidney stone. ? A nerve condition that affects your bladder control (neurogenic bladder). ? Not getting enough to drink, or not urinating often.  You have certain medical conditions, such as: ? Diabetes. ? A weak disease-fighting system (immunesystem). ? Sickle cell disease. ? Gout. ? Spinal cord injury. What are the signs or symptoms? Symptoms of this condition include:  Needing to urinate right away (urgently).  Frequent urination or passing small amounts of urine frequently.  Pain or burning with urination.  Blood in the urine.  Urine that smells bad or unusual.  Trouble urinating.  Cloudy urine.  Vaginal discharge, if you are female.  Pain in the abdomen or the lower back. You may also have:  Vomiting or a decreased appetite.  Confusion.  Irritability or tiredness.  A fever.  Diarrhea. The first symptom in older adults may be confusion. In some cases, they may not have any symptoms until the infection has worsened. How is this diagnosed? This condition is diagnosed based on your medical history and a physical exam. You may also have other tests, including:  Urine tests.  Blood tests.  Tests for sexually transmitted infections (STIs). If you have had more than one UTI, a cystoscopy or imaging studies may be done to determine the cause of the infections. How is this treated? Treatment for this condition includes:  Antibiotic medicine.  Over-the-counter  medicines to treat discomfort.  Drinking enough water to stay hydrated. If you have frequent infections or have other conditions such as a kidney stone, you may need to see a health care provider who specializes in the urinary tract (urologist). In rare cases, urinary tract infections can cause sepsis. Sepsis is a life-threatening condition that occurs  when the body responds to an infection. Sepsis is treated in the hospital with IV antibiotics, fluids, and other medicines. Follow these instructions at home:  Medicines  Take over-the-counter and prescription medicines only as told by your health care provider.  If you were prescribed an antibiotic medicine, take it as told by your health care provider. Do not stop using the antibiotic even if you start to feel better. General instructions  Make sure you: ? Empty your bladder often and completely. Do not hold urine for long periods of time. ? Empty your bladder after sex. ? Wipe from front to back after a bowel movement if you are female. Use each tissue one time when you wipe.  Drink enough fluid to keep your urine pale yellow.  Keep all follow-up visits as told by your health care provider. This is important. Contact a health care provider if:  Your symptoms do not get better after 1-2 days.  Your symptoms go away and then return. Get help right away if you have:  Severe pain in your back or your lower abdomen.  A fever.  Nausea or vomiting. Summary  A urinary tract infection (UTI) is an infection of any part of the urinary tract, which includes the kidneys, ureters, bladder, and urethra.  Most urinary tract infections are caused by bacteria in your genital area, around the entrance to your urinary tract (urethra).  Treatment for this condition often includes antibiotic medicines.  If you were prescribed an antibiotic medicine, take it as told by your health care provider. Do not stop using the antibiotic even if you start to feel better.  Keep all follow-up visits as told by your health care provider. This is important. This information is not intended to replace advice given to you by your health care provider. Make sure you discuss any questions you have with your health care provider. Document Released: 05/30/2005 Document Revised: 02/27/2018 Document Reviewed:  02/27/2018 Elsevier Interactive Patient Education  2019 [employer].

## 2018-11-29 ENCOUNTER — Other Ambulatory Visit: Payer: Self-pay | Admitting: Gastroenterology

## 2018-11-29 DIAGNOSIS — K580 Irritable bowel syndrome with diarrhea: Secondary | ICD-10-CM

## 2018-12-06 ENCOUNTER — Other Ambulatory Visit: Payer: Self-pay | Admitting: Gastroenterology

## 2018-12-06 DIAGNOSIS — K580 Irritable bowel syndrome with diarrhea: Secondary | ICD-10-CM

## 2018-12-09 ENCOUNTER — Ambulatory Visit (INDEPENDENT_AMBULATORY_CARE_PROVIDER_SITE_OTHER): Payer: BLUE CROSS/BLUE SHIELD | Admitting: Gastroenterology

## 2018-12-09 DIAGNOSIS — K625 Hemorrhage of anus and rectum: Secondary | ICD-10-CM

## 2018-12-09 DIAGNOSIS — K580 Irritable bowel syndrome with diarrhea: Secondary | ICD-10-CM

## 2018-12-09 NOTE — Progress Notes (Signed)
Sherri Sear, MD [address]  [place]  [place], [place] [address]  Main: [phone]  Fax: [phone]    Gastroenterology follow-up virtual Visit  Referring Provider:     Mikey College, * Primary Care Physician:  Mikey College, NP Primary Gastroenterologist:  Dr. Cephas Darby Reason for Consultation:     IBS-Diarrhea        HPI:   Brandi [name] is a 33 y.o. female referred by Dr. Merrilyn Puma, Jerrel Ivory, NP  for consultation & management of IBS-diarrhea  Virtual Visit via Telephone Note  I connected with [name] on 12/09/18 at 10:00 AM EDT by telephone and verified that I am speaking with the correct person using two identifiers.   I discussed the limitations, risks, security and privacy concerns of performing an evaluation and management service by telephone and the availability of in person appointments. I also discussed with the patient that there may be a patient responsible charge related to this service. The patient expressed understanding and agreed to proceed.  Location of the Patient: Home  Location of the provider: Home office   History of Present Illness:  I initially saw Birgitta about a month ago for IBS diarrhea.  At that time, I advised her to start amitriptyline 25 mg at bedtime as well as eliminate carbonated beverages.  She has been taking amitriptyline and noticed improvement in her symptoms in 1 week.  She also significantly cut back on carbonated beverages.  She is glad that her symptoms are under control.  She had a UTI for which she had to take antibiotic which resulted in severe antibiotic associated diarrhea, subsided after completing the course of antibiotics.  Currently, she is having regular bowel movements, formed and no other GI symptoms.  She also stopped Cymbalta as she is on amitriptyline.  She reports that her rectal bleeding significantly improved since control of her diarrhea.  She is not needing Bentyl   NSAIDs: None  Antiplts/Anticoagulants/Anti thrombotics: None  GI Procedures: None  Past Medical History:  Diagnosis Date  . Abnormal Pap smear of cervix 12/2015   ascus/pos- achd  . Chlamydial cervicitis   . Depression    pt has past treatment w/ Zoloft - good symptom control, but worsening IBS symptoms  . Genital HSV   . GERD (gastroesophageal reflux disease)   . Hypertension   . IBS (irritable bowel syndrome)   . Trichomoniasis     Past Surgical History:  Procedure Laterality Date  . NO PAST SURGERIES      Current Outpatient Medications:  .  amitriptyline (ELAVIL) 25 MG tablet, TAKE 1 TABLET (25 MG TOTAL) BY MOUTH AT BEDTIME FOR 30 DAYS., Disp: 30 tablet, Rfl: 0 .  Cholecalciferol (D3-1000) 25 MCG (1000 UT) tablet, Take 1,000 Units by mouth daily., Disp: , Rfl:  .  dicyclomine (BENTYL) 10 MG capsule, Take 1 capsule (10 mg total) by mouth 4 (four) times daily -  before meals and at bedtime., Disp: 90 capsule, Rfl: 0 .  hydrocortisone (ANUSOL-HC) 2.5 % rectal cream, Place 1 application rectally 2 (two) times daily as needed for hemorrhoids or anal itching., Disp: 30 g, Rfl: 0 .  lisinopril-hydrochlorothiazide (PRINZIDE,ZESTORETIC) 10-12.5 MG tablet, Take 1 tablet by mouth daily., Disp: 90 tablet, Rfl: 1 .  medroxyPROGESTERone (DEPO-PROVERA) 150 MG/ML injection, Inject into the muscle., Disp: , Rfl:  .  Multiple Vitamins-Minerals (WOMENS DAILY FORMULA PO), Take by mouth., Disp: , Rfl:  .  omeprazole (PRILOSEC) 20  MG capsule, Take 1 capsule (20 mg total) by mouth daily., Disp: 90 capsule, Rfl: 1 .  vitamin B-12 (CYANOCOBALAMIN) 500 MCG tablet, Take 500 mcg by mouth daily., Disp: , Rfl:  .  Albuterol Sulfate 108 (90 Base) MCG/ACT AEPB, Inhale into the lungs., Disp: , Rfl:  .  DULoxetine (CYMBALTA) 30 MG capsule, Take 1 capsule (30 mg total) by mouth daily. (Patient not taking: Reported on 12/09/2018), Disp: 90 capsule, Rfl: 1   Family History  Problem Relation Age of Onset  .  Diabetes Mother   . Hypertension Mother   . Heart disease Father   . Heart attack Father   . Heart disease Maternal Grandfather   . Heart disease Paternal Grandfather   . Hypertension Sister   . Lung cancer Maternal Grandmother   . Lung cancer Paternal Grandmother   . ADD / ADHD Son   . Cancer Neg Hx      Social History   Tobacco Use  . Smoking status: Current Every Day Smoker    Packs/day: 1.00    Years: 10.00    Pack years: 10.00    Types: Cigarettes  . Smokeless tobacco: Never Used  Substance Use Topics  . Alcohol use: Yes    Alcohol/week: 1.0 standard drinks    Types: 1 Glasses of wine per week    Comment: rare  . Drug use: Yes    Frequency: 7.0 times per week    Types: Marijuana    Comment: once daily at night (pt admits is self-medication for depression/insomnia)    Allergies as of 12/09/2018 - Review Complete 12/09/2018  Allergen Reaction Noted  . Amoxicillin Nausea And Vomiting and Rash 02/08/2016     Imaging Studies: No abdominal imaging  Assessment and Plan:   Brandi [name] is a 33 y.o. female with IBS diarrhea, currently in remission on amitriptyline 25 mg at bedtime and with elimination of carbonated beverages.  Rectal bleeding has significantly improved.  We will hold off on any further work-up at this time   Follow Up Instructions:   I discussed the assessment and treatment plan with the patient. The patient was provided an opportunity to ask questions and all were answered. The patient agreed with the plan and demonstrated an understanding of the instructions.   The patient was advised to call back or seek an in-person evaluation if the symptoms worsen or if the condition fails to improve as anticipated.  I provided 10 minutes of non-face-to-face time during this encounter.   Follow up in 3 months   Cephas Darby, MD

## 2018-12-10 ENCOUNTER — Telehealth: Payer: Self-pay | Admitting: Gastroenterology

## 2018-12-10 DIAGNOSIS — F331 Major depressive disorder, recurrent, moderate: Secondary | ICD-10-CM | POA: Diagnosis not present

## 2018-12-10 NOTE — Telephone Encounter (Signed)
UNABLE TO LEAVE VM TO SCHEDULE 3 MONTH F/U WITH dR. vANGA

## 2018-12-16 ENCOUNTER — Ambulatory Visit: Payer: BLUE CROSS/BLUE SHIELD | Admitting: Gastroenterology

## 2018-12-24 DIAGNOSIS — F331 Major depressive disorder, recurrent, moderate: Secondary | ICD-10-CM | POA: Diagnosis not present

## 2019-01-01 ENCOUNTER — Other Ambulatory Visit: Payer: Self-pay | Admitting: Gastroenterology

## 2019-01-01 DIAGNOSIS — K580 Irritable bowel syndrome with diarrhea: Secondary | ICD-10-CM

## 2019-01-08 DIAGNOSIS — F331 Major depressive disorder, recurrent, moderate: Secondary | ICD-10-CM | POA: Diagnosis not present

## 2019-01-14 DIAGNOSIS — F331 Major depressive disorder, recurrent, moderate: Secondary | ICD-10-CM | POA: Diagnosis not present

## 2019-01-21 DIAGNOSIS — F331 Major depressive disorder, recurrent, moderate: Secondary | ICD-10-CM | POA: Diagnosis not present

## 2019-02-04 DIAGNOSIS — F331 Major depressive disorder, recurrent, moderate: Secondary | ICD-10-CM | POA: Diagnosis not present

## 2019-02-11 DIAGNOSIS — S299XXA Unspecified injury of thorax, initial encounter: Secondary | ICD-10-CM | POA: Diagnosis not present

## 2019-02-11 DIAGNOSIS — Z3202 Encounter for pregnancy test, result negative: Secondary | ICD-10-CM | POA: Diagnosis not present

## 2019-02-19 DIAGNOSIS — F331 Major depressive disorder, recurrent, moderate: Secondary | ICD-10-CM | POA: Diagnosis not present

## 2019-02-26 DIAGNOSIS — F331 Major depressive disorder, recurrent, moderate: Secondary | ICD-10-CM | POA: Diagnosis not present

## 2019-03-12 ENCOUNTER — Ambulatory Visit: Payer: BLUE CROSS/BLUE SHIELD | Admitting: Gastroenterology

## 2019-03-12 DIAGNOSIS — F331 Major depressive disorder, recurrent, moderate: Secondary | ICD-10-CM | POA: Diagnosis not present

## 2019-03-24 ENCOUNTER — Ambulatory Visit (INDEPENDENT_AMBULATORY_CARE_PROVIDER_SITE_OTHER): Payer: BC Managed Care – PPO | Admitting: Nurse Practitioner

## 2019-03-24 ENCOUNTER — Encounter: Payer: Self-pay | Admitting: Nurse Practitioner

## 2019-03-24 ENCOUNTER — Other Ambulatory Visit: Payer: Self-pay

## 2019-03-24 VITALS — BP 127/64 | HR 86 | Ht 65.0 in | Wt 239.2 lb

## 2019-03-24 DIAGNOSIS — Z3042 Encounter for surveillance of injectable contraceptive: Secondary | ICD-10-CM

## 2019-03-24 LAB — POCT URINE PREGNANCY: Preg Test, Ur: NEGATIVE

## 2019-03-24 MED ORDER — MEDROXYPROGESTERONE ACETATE 150 MG/ML IM SUSP
150.0000 mg | Freq: Once | INTRAMUSCULAR | Status: AC
  Administered 2019-03-24: 150 mg via INTRAMUSCULAR

## 2019-03-24 MED ORDER — MEDROXYPROGESTERONE ACETATE 150 MG/ML IM SUSP: 150.0000 mg | INTRAMUSCULAR | 4 refills | Status: DC

## 2019-03-24 NOTE — Progress Notes (Signed)
Subjective:    Patient ID: Brandi [name], female    DOB: [birth year], 33 y.o.   MRN: [id number]  Brandi [name] is a 33 y.o. female presenting on 03/24/2019 for Contraception (the pt would like to get back on depo provera )  HPI Contraception Last injection February 2020, Has been using condoms in break from injections.  Desires to resume depo provera.   Likes this option due to wanting to have a baby in next 1-2 years.  States she is not good at remembering to take OCP.  Had a baby while taking OCPs due to forgetfulness.  Does not desire to use patch/ring option.  Has never used depo provera in past.    Patient's last menstrual period was 03/20/2019.    Social History   Tobacco Use  . Smoking status: Current Every Day Smoker    Packs/day: 1.00    Years: 10.00    Pack years: 10.00    Types: Cigarettes  . Smokeless tobacco: Never Used  Substance Use Topics  . Alcohol use: Yes    Alcohol/week: 1.0 standard drinks    Types: 1 Glasses of wine per week    Comment: rare  . Drug use: Yes    Frequency: 7.0 times per week    Types: Marijuana    Comment: once daily at night (pt admits is self-medication for depression/insomnia)    Review of Systems Per HPI unless specifically indicated above     Objective:    BP 127/64 (BP Location: Right Arm, Patient Position: Sitting, Cuff Size: Large)   Pulse 86   Ht 5\' 5"  (1.651 m)   Wt 239 lb 3.2 oz (108.5 kg)   LMP 03/20/2019   BMI 39.80 kg/m   Wt Readings from Last 3 Encounters:  03/24/19 239 lb 3.2 oz (108.5 kg)  11/14/18 229 lb (103.9 kg)  10/22/18 233 lb 3.2 oz (105.8 kg)    Physical Exam Vitals signs reviewed.  Constitutional:      General: She is not in acute distress.    Appearance: She is well-developed. She is obese.  HENT:     Head: Normocephalic and atraumatic.  Skin:    General: Skin is warm and dry.  Neurological:     Mental Status: She is alert and oriented to person, place, and time.  Psychiatric:      Mood and Affect: Mood normal.        Behavior: Behavior normal.        Thought Content: Thought content normal.        Judgment: Judgment normal.    Results for orders placed or performed in visit on 03/24/19  POCT urine pregnancy  Result Value Ref Range   Preg Test, Ur Negative Negative      Assessment & Plan:   Problem List Items Addressed This Visit    None    Visit Diagnoses    Encounter for surveillance of injectable contraceptive    -  Primary   Relevant Medications   medroxyPROGESTERone (DEPO-PROVERA) 150 MG/ML injection   medroxyPROGESTERone (DEPO-PROVERA) injection 150 mg (Completed)   Other Relevant Orders   POCT urine pregnancy (Completed)    Pt desires depo provera to prevent pregnancy and wants to discuss options today.  Patient is not currently pregnant.  Patient's last menstrual period was 03/20/2019. Neg pregnancy test today.  Obesity complicates depo provera recommendation, but patient continues to desire this option.  Plan: 1. Discussed OCP, patch, ring, implanon, IUD.  Depo provera  side effects of weight gain and osteoporosis risk with prolonged use were discussed. 2. Pt prefers to start depo provera after discussion 3. Continue depo provera injection every 87-93 days. 4. Followup as needed and in 1 year.   Meds ordered this encounter  Medications  . DISCONTD: medroxyPROGESTERone (DEPO-PROVERA) 150 MG/ML injection    Sig: Inject 1 mL (150 mg total) into the muscle every 3 (three) months.    Dispense:  1 mL    Refill:  4    Order Specific Question:   Supervising Provider    Answer:   Olin Hauser [[id number]]  . medroxyPROGESTERone (DEPO-PROVERA) 150 MG/ML injection    Sig: Inject 1 mL (150 mg total) into the muscle every 3 (three) months.    Dispense:  1 mL    Refill:  4    Order Specific Question:   Supervising Provider    Answer:   Olin Hauser [[id number]]  . medroxyPROGESTERone (DEPO-PROVERA) injection 150 mg    Follow up plan:  Return in about 1 year (around 03/23/2020) for annual physical.  Cassell Smiles, DNP, AGPCNP-BC Adult Gerontology Primary Care Nurse Practitioner [facility] 03/24/2019, 9:28 AM

## 2019-03-24 NOTE — Patient Instructions (Addendum)
Brandi [name],   Thank you for coming in to clinic today.  1. Continue depo shots every 90 days.  Have your injection within 3 days of 90 days before or after.  Please schedule a follow-up appointment with Cassell Smiles, AGNP. Return in about 1 year (around 03/23/2020) for annual physical.  If you have any other questions or concerns, please feel free to call the clinic or send a message through [place]. You may also schedule an earlier appointment if necessary.  You will receive a survey after today's visit either digitally by e-mail or paper by [employer]. Your experiences and feedback matter to [place].  Please respond so we know how we are doing as we provide care for you.   Cassell Smiles, DNP, AGNP-BC Adult Gerontology Nurse Practitioner [place]

## 2019-04-01 ENCOUNTER — Ambulatory Visit: Payer: BC Managed Care – PPO | Admitting: Gastroenterology

## 2019-04-07 ENCOUNTER — Ambulatory Visit: Payer: BC Managed Care – PPO

## 2019-04-22 ENCOUNTER — Other Ambulatory Visit: Payer: Self-pay | Admitting: Nurse Practitioner

## 2019-04-22 DIAGNOSIS — I10 Essential (primary) hypertension: Secondary | ICD-10-CM

## 2019-05-06 DIAGNOSIS — F331 Major depressive disorder, recurrent, moderate: Secondary | ICD-10-CM | POA: Diagnosis not present

## 2019-05-07 ENCOUNTER — Ambulatory Visit (INDEPENDENT_AMBULATORY_CARE_PROVIDER_SITE_OTHER): Payer: BC Managed Care – PPO | Admitting: Nurse Practitioner

## 2019-05-07 ENCOUNTER — Other Ambulatory Visit: Payer: Self-pay

## 2019-05-07 ENCOUNTER — Encounter: Payer: Self-pay | Admitting: Nurse Practitioner

## 2019-05-07 VITALS — BP 127/76 | Temp 98.0°F

## 2019-05-07 DIAGNOSIS — M419 Scoliosis, unspecified: Secondary | ICD-10-CM

## 2019-05-07 DIAGNOSIS — M545 Low back pain, unspecified: Secondary | ICD-10-CM

## 2019-05-07 MED ORDER — BACLOFEN 10 MG PO TABS: 10.0000 mg | ORAL_TABLET | Freq: Three times a day (TID) | ORAL | 0 refills | Status: DC | PRN

## 2019-05-07 NOTE — Progress Notes (Signed)
Telemedicine Encounter: Disclosed to patient at start of encounter that we will provide appropriate telemedicine services.  Patient consents to be treated via phone prior to discussion. - Patient is at her home and is accessed via telephone. - Services are provided by Cassell Smiles from [facility].  Subjective:    Patient ID: Brandi [name], female    DOB: 10-10-1985, 33 y.o.   MRN: [id number]  Brandi [name] is a 33 y.o. female presenting on 05/07/2019 for Back Pain (Lower right side x5 days with spasms sporadically )  HPI Low Back Pain Chronic low back pain, but gets flares occasionally.  Now has pain with movement for last 5 days (10/10).  Has had spasms, but reduced yesterday and some today.  Still has pain, less intensity (4/10). Pain is normally a tightness, stiff 4/10.  Bending over causes spasms with sharp pain. 10/10    - Hydrocodone leftover Rx is helping at bedtime. - Mild scoliosis   Social History   Tobacco Use  . Smoking status: Current Every Day Smoker    Packs/day: 1.00    Years: 10.00    Pack years: 10.00    Types: Cigarettes  . Smokeless tobacco: Never Used  Substance Use Topics  . Alcohol use: Yes    Alcohol/week: 1.0 standard drinks    Types: 1 Glasses of wine per week    Comment: rare  . Drug use: Yes    Frequency: 7.0 times per week    Types: Marijuana    Comment: once daily at night (pt admits is self-medication for depression/insomnia)    Review of Systems Per HPI unless specifically indicated above     Objective:    BP 127/76 (BP Location: Right Arm, Patient Position: Bed low/side rails up, Cuff Size: Normal) Comment (BP Location): Patient took her own bp  Temp 50 F (36.7 C) (Oral) Comment: Patient was able to take her temperature at home.  Wt Readings from Last 3 Encounters:  03/24/19 239 lb 3.2 oz (108.5 kg)  11/14/18 229 lb (103.9 kg)  10/22/18 233 lb 3.2 oz (105.8 kg)    Physical Exam Patient remotely  monitored.  Verbal communication appropriate.  Cognition normal.   Results for orders placed or performed in visit on 03/24/19  POCT urine pregnancy  Result Value Ref Range   Preg Test, Ur Negative Negative      Assessment & Plan:   Problem List Items Addressed This Visit    None    Visit Diagnoses    Scoliosis, unspecified scoliosis type, unspecified spinal region    -  Primary   Relevant Medications   baclofen (LIORESAL) 10 MG tablet   Acute bilateral low back pain without sciatica       Relevant Medications   baclofen (LIORESAL) 10 MG tablet      Meds ordered this encounter  Medications  . baclofen (LIORESAL) 10 MG tablet    Sig: Take 1 tablet (10 mg total) by mouth 3 (three) times daily as needed for muscle spasms.    Dispense:  45 each    Refill:  0    Order Specific Question:   Supervising Provider    Answer:   Olin Hauser [[id number]]   Uncontrolled back pain.  Not improved with use of tizanidine.   - Consider physical therapy - Start baclofen 10 mg tid prn spasm - Treat with OTC pain meds (acetaminophen and ibuprofen).  Discussed alternate dosing and max dosing. - Apply heat  and/or ice to affected area. - May also apply a muscle rub with lidocaine or lidocaine patch after heat or ice. -  Follow up 2-4 weeks prn no improvement.    - Time spent in direct consultation with patient via telemedicine about above concerns: 12 minutes  Follow up plan: 2-4 weeks prn  Cassell Smiles, DNP, AGPCNP-BC Adult Gerontology Primary Care Nurse Practitioner [facility] 05/07/2019, 3:21 PM

## 2019-05-07 NOTE — Patient Instructions (Signed)
Low Back Pain Exercises See other page with pictures of each exercise.  Start with 1 or 2 of these exercises that you are most comfortable with. Do not do any exercises that cause you significant worsening pain. Some of these may cause some "stretching soreness" but it should go away after you stop the exercise, and get better over time. Gradually increase up to 3-4 exercises as tolerated.  Standing hamstring stretch: Place the heel of your leg on a stool about 15 inches high. Keep your knee straight. Lean forward, bending at the hips until you feel a mild stretch in the back of your thigh. Make sure you do not roll your shoulders and bend at the waist when doing this or you will stretch your lower back instead. Hold the stretch for 15 to 30 seconds. Repeat 3 times. Repeat the same stretch on your other leg.  Cat and camel: Get down on your hands and knees. Let your stomach sag, allowing your back to curve downward. Hold this position for 5 seconds. Then arch your back and hold for 5 seconds. Do 3 sets of 10.  Quadriped Arm/Leg Raises: Get down on your hands and knees. Tighten your abdominal muscles to stiffen your spine. While keeping your abdominals tight, raise one arm and the opposite leg away from you. Hold this position for 5 seconds. Lower your arm and leg slowly and alternate sides. Do this 10 times on each side.  Pelvic tilt: Lie on your back with your knees bent and your feet flat on the floor. Tighten your abdominal muscles and push your lower back into the floor. Hold this position for 5 seconds, then relax. Do 3 sets of 10.  Partial curl: Lie on your back with your knees bent and your feet flat on the floor. Tighten your stomach muscles and flatten your back against the floor. Tuck your chin to your chest. With your hands stretched out in front of you, curl your upper body forward until your shoulders clear the floor. Hold this position for 3 seconds. Don't hold your breath. It helps to  breathe out as you lift your shoulders up. Relax. Repeat 10 times. Build to 3 sets of 10. To challenge yourself, clasp your hands behind your head and keep your elbows out to the side.  Lower trunk rotation: Lie on your back with your knees bent and your feet flat on the floor. Tighten your abdominal muscles and push your lower back into the floor. Keeping your shoulders down flat, gently rotate your legs to one side, then the other as far as you can. Repeat 10 to 20 times.  Single knee to chest stretch: Lie on your back with your legs straight out in front of you. Bring one knee up to your chest and grasp the back of your thigh. Pull your knee toward your chest, stretching your buttock muscle. Hold this position for 15 to 30 seconds and return to the starting position. Repeat 3 times on each side.  Double knee to chest: Lie on your back with your knees bent and your feet flat on the floor. Tighten your abdominal muscles and push your lower back into the floor. Pull both knees up to your chest. Hold for 5 seconds and repeat 10 to 20 times.

## 2019-05-14 ENCOUNTER — Other Ambulatory Visit: Payer: Self-pay | Admitting: Nurse Practitioner

## 2019-05-14 ENCOUNTER — Encounter: Payer: Self-pay | Admitting: Nurse Practitioner

## 2019-05-14 DIAGNOSIS — M419 Scoliosis, unspecified: Secondary | ICD-10-CM

## 2019-05-14 DIAGNOSIS — M545 Low back pain, unspecified: Secondary | ICD-10-CM

## 2019-05-20 DIAGNOSIS — F331 Major depressive disorder, recurrent, moderate: Secondary | ICD-10-CM | POA: Diagnosis not present

## 2019-06-03 DIAGNOSIS — F331 Major depressive disorder, recurrent, moderate: Secondary | ICD-10-CM | POA: Diagnosis not present

## 2019-06-04 ENCOUNTER — Other Ambulatory Visit: Payer: Self-pay | Admitting: Nurse Practitioner

## 2019-06-04 DIAGNOSIS — M545 Low back pain, unspecified: Secondary | ICD-10-CM

## 2019-06-04 DIAGNOSIS — M419 Scoliosis, unspecified: Secondary | ICD-10-CM

## 2019-06-17 DIAGNOSIS — F331 Major depressive disorder, recurrent, moderate: Secondary | ICD-10-CM | POA: Diagnosis not present

## 2019-06-18 ENCOUNTER — Other Ambulatory Visit: Payer: Self-pay

## 2019-06-18 ENCOUNTER — Ambulatory Visit: Payer: BC Managed Care – PPO

## 2019-06-18 ENCOUNTER — Ambulatory Visit (INDEPENDENT_AMBULATORY_CARE_PROVIDER_SITE_OTHER): Payer: BC Managed Care – PPO

## 2019-06-18 DIAGNOSIS — Z3042 Encounter for surveillance of injectable contraceptive: Secondary | ICD-10-CM | POA: Diagnosis not present

## 2019-06-18 MED ORDER — MEDROXYPROGESTERONE ACETATE 150 MG/ML IM SUSP
150.0000 mg | Freq: Once | INTRAMUSCULAR | Status: AC
  Administered 2019-06-18: 150 mg via INTRAMUSCULAR

## 2019-06-19 ENCOUNTER — Other Ambulatory Visit: Payer: Self-pay | Admitting: Nurse Practitioner

## 2019-06-19 DIAGNOSIS — M545 Low back pain, unspecified: Secondary | ICD-10-CM

## 2019-06-19 DIAGNOSIS — M419 Scoliosis, unspecified: Secondary | ICD-10-CM

## 2019-07-16 DIAGNOSIS — J0100 Acute maxillary sinusitis, unspecified: Secondary | ICD-10-CM | POA: Diagnosis not present

## 2019-07-16 DIAGNOSIS — B373 Candidiasis of vulva and vagina: Secondary | ICD-10-CM | POA: Diagnosis not present

## 2019-07-16 DIAGNOSIS — J40 Bronchitis, not specified as acute or chronic: Secondary | ICD-10-CM | POA: Diagnosis not present

## 2019-07-16 DIAGNOSIS — Z03818 Encounter for observation for suspected exposure to other biological agents ruled out: Secondary | ICD-10-CM | POA: Diagnosis not present

## 2019-09-10 ENCOUNTER — Other Ambulatory Visit: Payer: Self-pay

## 2019-09-10 ENCOUNTER — Ambulatory Visit (INDEPENDENT_AMBULATORY_CARE_PROVIDER_SITE_OTHER): Payer: BC Managed Care – PPO

## 2019-09-10 VITALS — BP 131/75 | HR 91 | Temp 98.4°F | Resp 16 | Ht 65.0 in | Wt 239.0 lb

## 2019-09-10 DIAGNOSIS — Z3040 Encounter for surveillance of contraceptives, unspecified: Secondary | ICD-10-CM

## 2019-09-10 LAB — POCT URINE PREGNANCY: Preg Test, Ur: NEGATIVE

## 2019-09-10 MED ORDER — MEDROXYPROGESTERONE ACETATE 150 MG/ML IM SUSP
150.0000 mg | Freq: Once | INTRAMUSCULAR | Status: AC
  Administered 2019-09-10: 150 mg via INTRAMUSCULAR

## 2019-10-01 DIAGNOSIS — F331 Major depressive disorder, recurrent, moderate: Secondary | ICD-10-CM | POA: Diagnosis not present

## 2019-11-02 ENCOUNTER — Other Ambulatory Visit: Payer: Self-pay | Admitting: Nurse Practitioner

## 2019-11-02 DIAGNOSIS — I10 Essential (primary) hypertension: Secondary | ICD-10-CM

## 2019-11-08 ENCOUNTER — Other Ambulatory Visit: Payer: Self-pay | Admitting: Nurse Practitioner

## 2019-11-08 DIAGNOSIS — K219 Gastro-esophageal reflux disease without esophagitis: Secondary | ICD-10-CM

## 2019-12-17 ENCOUNTER — Other Ambulatory Visit: Payer: Self-pay

## 2019-12-17 ENCOUNTER — Ambulatory Visit (INDEPENDENT_AMBULATORY_CARE_PROVIDER_SITE_OTHER): Payer: BC Managed Care – PPO

## 2019-12-17 DIAGNOSIS — J019 Acute sinusitis, unspecified: Secondary | ICD-10-CM | POA: Diagnosis not present

## 2019-12-17 DIAGNOSIS — B9689 Other specified bacterial agents as the cause of diseases classified elsewhere: Secondary | ICD-10-CM | POA: Diagnosis not present

## 2019-12-17 DIAGNOSIS — F331 Major depressive disorder, recurrent, moderate: Secondary | ICD-10-CM | POA: Diagnosis not present

## 2019-12-17 DIAGNOSIS — Z30019 Encounter for initial prescription of contraceptives, unspecified: Secondary | ICD-10-CM

## 2019-12-17 DIAGNOSIS — Z20828 Contact with and (suspected) exposure to other viral communicable diseases: Secondary | ICD-10-CM | POA: Diagnosis not present

## 2019-12-17 LAB — POCT URINE PREGNANCY: Preg Test, Ur: NEGATIVE

## 2019-12-17 MED ORDER — MEDROXYPROGESTERONE ACETATE 150 MG/ML IM SUSP
150.0000 mg | Freq: Once | INTRAMUSCULAR | Status: AC
  Administered 2019-12-17: 12:00:00 150 mg via INTRAMUSCULAR

## 2019-12-22 DIAGNOSIS — B9689 Other specified bacterial agents as the cause of diseases classified elsewhere: Secondary | ICD-10-CM | POA: Diagnosis not present

## 2019-12-22 DIAGNOSIS — J019 Acute sinusitis, unspecified: Secondary | ICD-10-CM | POA: Diagnosis not present

## 2019-12-22 DIAGNOSIS — Z20828 Contact with and (suspected) exposure to other viral communicable diseases: Secondary | ICD-10-CM | POA: Diagnosis not present

## 2020-01-21 DIAGNOSIS — F331 Major depressive disorder, recurrent, moderate: Secondary | ICD-10-CM | POA: Diagnosis not present

## 2020-02-08 DIAGNOSIS — F331 Major depressive disorder, recurrent, moderate: Secondary | ICD-10-CM | POA: Diagnosis not present

## 2020-02-12 DIAGNOSIS — F331 Major depressive disorder, recurrent, moderate: Secondary | ICD-10-CM | POA: Diagnosis not present

## 2020-02-16 ENCOUNTER — Telehealth: Payer: Self-pay

## 2020-02-16 NOTE — Telephone Encounter (Signed)
Patient is calling wanting a note written for her work for her IBS. Called and left a detail message stating that patient needed a appointment to see her and also discuss getting a letter written. Informed patient to give a call back to scheduled this appointment.

## 2020-02-17 ENCOUNTER — Ambulatory Visit: Payer: BC Managed Care – PPO | Admitting: Gastroenterology

## 2020-02-22 ENCOUNTER — Other Ambulatory Visit: Payer: Self-pay

## 2020-02-22 ENCOUNTER — Encounter: Payer: Self-pay | Admitting: Gastroenterology

## 2020-02-22 ENCOUNTER — Ambulatory Visit (INDEPENDENT_AMBULATORY_CARE_PROVIDER_SITE_OTHER): Payer: BC Managed Care – PPO | Admitting: Gastroenterology

## 2020-02-22 VITALS — BP 160/93 | HR 121 | Temp 98.2°F | Ht 65.0 in | Wt 255.4 lb

## 2020-02-22 DIAGNOSIS — R1013 Epigastric pain: Secondary | ICD-10-CM

## 2020-02-22 DIAGNOSIS — K580 Irritable bowel syndrome with diarrhea: Secondary | ICD-10-CM | POA: Diagnosis not present

## 2020-02-22 MED ORDER — RIFAXIMIN 550 MG PO TABS
550.0000 mg | ORAL_TABLET | Freq: Three times a day (TID) | ORAL | 0 refills | Status: AC
Start: 2020-02-22 — End: 2020-03-07

## 2020-02-22 NOTE — Progress Notes (Signed)
Cephas Darby, MD [address]  [employer]  [place], [place] [address]  Main: [phone]  Fax: [phone]    Gastroenterology Consultation  Referring Provider:     No ref. provider found Primary Care Physician:  Patient, No Pcp Per Primary Gastroenterologist:  Dr. Cephas Darby Reason for Consultation:     IBS diarrhea        HPI:   Brandi [name] is a 34 y.o. female referred by Dr. Patient, No Pcp Per  for consultation & management of several years history of abdominal cramps, nonbloody diarrhea and bloating.  She has been dealing with these symptoms for several years, tried Imodium in the past, diarrhea temporarily improved.  Dicyclomine helps to some extent.  She tried IBgard which does provide symptom relief.  She gained weight in last few years as well as going through significant anxiety and depression.  She is currently treated with Cymbalta.  She consumes sweetened tea, carbonated beverages regularly.  Her symptoms are almost on a daily basis significantly impacting her personal and professional life, losing at least 1 hour/week of her work time.  She is worried about losing her job due to frequent breaks she has to take to use bathroom.  She is also complaining of rectal discomfort, perianal itching and irritation from her hemorrhoids due to increased bowel frequency  Follow-up phone visit 12/09/2018 I initially saw Brandi [name] about a month ago for IBS diarrhea.  At that time, I advised her to start amitriptyline 25 mg at bedtime as well as eliminate carbonated beverages.  She has been taking amitriptyline and noticed improvement in her symptoms in 1 week.  She also significantly cut back on carbonated beverages.  She is glad that her symptoms are under control.  She had a UTI for which she had to take antibiotic which resulted in severe antibiotic associated diarrhea, subsided after completing the course of antibiotics.  Currently, she is having regular bowel movements,  formed and no other GI symptoms.  She also stopped Cymbalta as she is on amitriptyline.  She reports that her rectal bleeding significantly improved since control of her diarrhea.  She is not needing Bentyl  Follow-up visit 02/22/2020 Patient is here for follow-up of her IBS symptoms.  She did not see me for 1 year.  She reports that amitriptyline did help but it resulted in significant daytime drowsiness interfering with her work.  She cut down to 12.5 mg which did not help with her IBS symptoms.  She does report epigastric fullness associated with significant bloating, gas and increased bowel frequency.  She is taking Bentyl 10 mg as needed which she thinks she may need higher dose.  She has gained about 15 pounds since last visit.  She thinks her weight gain is secondary to Depo which she is planning to discontinue.  She is also trying to find new PCP.  She said eating cheese is her weakness and unable to cut down on it She thinks she is more depressed than before.  Zoloft helped her depression in the past but made her IBS symptoms worse.  He is also requesting to fill out the paperwork to submit to her workplace which can accommodate at least an hour per week  for frequent clock out She stopped drinking carbonated beverages, cut down on red meat She smokes cigarettes She denies drinking alcohol  NSAIDs: None  Antiplts/Anticoagulants/Anti thrombotics: None  GI Procedures: None She denies family history of celiac disease, GI malignancy, inflammatory  bowel disease  Past Medical History:  Diagnosis Date  . Abnormal Pap smear of cervix 12/2015   ascus/pos- achd  . Chlamydial cervicitis   . Depression    pt has past treatment w/ Zoloft - good symptom control, but worsening IBS symptoms  . Genital HSV   . GERD (gastroesophageal reflux disease)   . Hypertension   . IBS (irritable bowel syndrome)   . Trichomoniasis     Past Surgical History:  Procedure Laterality Date  . NO PAST SURGERIES      Current Outpatient Medications:  .  Albuterol Sulfate 108 (90 Base) MCG/ACT AEPB, Inhale into the lungs., Disp: , Rfl:  .  baclofen (LIORESAL) 10 MG tablet, TAKE 1 TABLET BY MOUTH THREE TIMES A DAY AS NEEDED FOR MUSCLE SPASMS, Disp: 270 tablet, Rfl: 1 .  dicyclomine (BENTYL) 10 MG capsule, Take 1 capsule (10 mg total) by mouth 4 (four) times daily -  before meals and at bedtime., Disp: 90 capsule, Rfl: 0 .  fluticasone (FLONASE) 50 MCG/ACT nasal spray, Place 1 spray into both nostrils 2 (two) times daily., Disp: , Rfl:  .  hydrocortisone (ANUSOL-HC) 2.5 % rectal cream, Place 1 application rectally 2 (two) times daily as needed for hemorrhoids or anal itching., Disp: 30 g, Rfl: 0 .  lisinopril-hydrochlorothiazide (ZESTORETIC) 10-12.5 MG tablet, TAKE 1 TABLET BY MOUTH EVERY DAY, Disp: 30 tablet, Rfl: 5 .  medroxyPROGESTERone (DEPO-PROVERA) 150 MG/ML injection, Inject 1 mL (150 mg total) into the muscle every 3 (three) months., Disp: 1 mL, Rfl: 4 .  Multiple Vitamins-Minerals (WOMENS DAILY FORMULA PO), Take by mouth., Disp: , Rfl:  .  omeprazole (PRILOSEC) 20 MG capsule, TAKE 1 CAPSULE BY MOUTH EVERY DAY, Disp: 30 capsule, Rfl: 3 .  vitamin B-12 (CYANOCOBALAMIN) 500 MCG tablet, Take 500 mcg by mouth daily., Disp: , Rfl:  .  rifaximin (XIFAXAN) 550 MG TABS tablet, Take 1 tablet (550 mg total) by mouth 3 (three) times daily for 14 days., Disp: 42 tablet, Rfl: 0   Family History  Problem Relation Age of Onset  . Diabetes Mother   . Hypertension Mother   . Heart disease Father   . Heart attack Father   . Heart disease Maternal Grandfather   . Heart disease Paternal Grandfather   . Hypertension Sister   . Lung cancer Maternal Grandmother   . Lung cancer Paternal Grandmother   . ADD / ADHD Son   . Cancer Neg Hx      Social History   Tobacco Use  . Smoking status: Current Every Day Smoker    Packs/day: 1.00    Years: 10.00    Pack years: 10.00    Types: Cigarettes  . Smokeless  tobacco: Never Used  Vaping Use  . Vaping Use: Never used  Substance Use Topics  . Alcohol use: Yes    Alcohol/week: 1.0 standard drink    Types: 1 Glasses of wine per week    Comment: rare  . Drug use: Yes    Frequency: 7.0 times per week    Types: Marijuana    Comment: once daily at night (pt admits is self-medication for depression/insomnia)    Allergies as of 02/22/2020 - Review Complete 02/22/2020  Allergen Reaction Noted  . Amoxicillin Nausea And Vomiting and Rash 02/08/2016    Review of Systems:    All systems reviewed and negative except where noted in HPI.   Physical Exam:  BP (!) 160/93 (BP Location: Left Arm, Patient Position: Sitting, Cuff  Size: Large)   Pulse (!) 121   Temp 98.2 F (36.8 C) (Oral)   Ht 5\' 5"  (1.651 m)   Wt 255 lb 6 oz (115.8 kg)   BMI 42.50 kg/m  No LMP recorded.  General:   Alert,  Well-developed, well-nourished, pleasant and cooperative in NAD Head:  Normocephalic and atraumatic. Eyes:  Sclera clear, no icterus.   Conjunctiva pink. Ears:  Normal auditory acuity. Nose:  No deformity, discharge, or lesions. Mouth:  No deformity or lesions,oropharynx pink & moist. Neck:  Supple; no masses or thyromegaly. Lungs:  Respirations even and unlabored.  Clear throughout to auscultation.   No wheezes, crackles, or rhonchi. No acute distress. Heart:  Regular rate and rhythm; no murmurs, clicks, rubs, or gallops. Abdomen:  Normal bowel sounds. Soft, obese, non-tender and non-distended without masses, hepatosplenomegaly or hernias noted.  No guarding or rebound tenderness.   Rectal: Not performed Msk:  Symmetrical without gross deformities. Good, equal movement & strength bilaterally. Pulses:  Normal pulses noted. Extremities:  No clubbing or edema.  No cyanosis. Neurologic:  Alert and oriented x3;  grossly normal neurologically. Skin:  Intact without significant lesions or rashes. No jaundice. Psych:  Alert and cooperative. Normal mood and  affect.  Imaging Studies: No abdominal imaging  Assessment and Plan:   Brandi [name] is a 34 y.o. female with obesity, anxiety and depression, several years history of abdominal cramps, nonbloody diarrhea, bloating without any constitutional symptoms.  Her symptoms are consistent with diarrhea predominant IBS.   IBS-diarrhea Trial of rifaximin 550 mg 3 times daily for 2 weeks IBgard did not help Did not tolerate amitriptyline due to daytime drowsiness Check H. pylori IgG, H. pylori breath test and screen for celiac disease Discussed about increase physical activity, diet and exercise to lose weight Provide her with letter to her work regarding her GI condition   Follow up in 3 months   Cephas Darby, MD

## 2020-02-24 LAB — CELIAC DISEASE PANEL
Endomysial IgA: NEGATIVE
IgA/Immunoglobulin A, Serum: 496 mg/dL — ABNORMAL HIGH (ref 87–352)
Transglutaminase IgA: <2 U/mL (ref 0–3)

## 2020-02-24 LAB — H. PYLORI ANTIBODY, IGG: H. pylori, IgG AbS: 0.25 Index Value (ref 0.00–0.79)

## 2020-02-24 LAB — H. PYLORI BREATH TEST: H pylori Breath Test: NEGATIVE

## 2020-02-25 DIAGNOSIS — F331 Major depressive disorder, recurrent, moderate: Secondary | ICD-10-CM | POA: Diagnosis not present

## 2020-03-16 ENCOUNTER — Telehealth: Payer: Self-pay

## 2020-03-16 DIAGNOSIS — F331 Major depressive disorder, recurrent, moderate: Secondary | ICD-10-CM | POA: Diagnosis not present

## 2020-03-16 NOTE — Telephone Encounter (Signed)
We can only allow for bathroom breaks of 3-4 times daily for not more than 42min each  RV

## 2020-03-16 NOTE — Telephone Encounter (Signed)
Patient is calling to find out if we can fill out ADA paper work for her IBS and having to have frequent trips to the bathroom. Patient states she is going to drop them off today

## 2020-03-23 ENCOUNTER — Telehealth: Payer: Self-pay

## 2020-03-23 NOTE — Telephone Encounter (Signed)
Patient dropped off ADA forms to be filled out by Dr. Marius Ditch. She filled out some of the section that the provider had to filled out. Called patient and informed patient we would need new paper work because we had to fill out that section that she filled out. She verbalized understanding and said to shred that copy of the paper work

## 2020-03-24 ENCOUNTER — Telehealth: Payer: Self-pay

## 2020-03-24 NOTE — Telephone Encounter (Signed)
Patient brought ADA forms to be filled out on the form it asks how long will the impairment last. Unknown, permanent or temporary. If temporary what is the recovery date. What specific accomodation if any do you recommend that may enable the patient to overcome the limitations referenced above and enable the patient to perform his or her job function and or access benefits and other privileges or employment? Please explain how the suggested accomodation is likely to be effective in addressing the limitations. This would be a reduced work scheduled wouldn't it. If it is what is the reduced work scheduled, what time period- Start date and end date

## 2020-03-30 DIAGNOSIS — F331 Major depressive disorder, recurrent, moderate: Secondary | ICD-10-CM | POA: Diagnosis not present

## 2020-04-02 ENCOUNTER — Other Ambulatory Visit: Payer: Self-pay | Admitting: Family Medicine

## 2020-04-02 DIAGNOSIS — K219 Gastro-esophageal reflux disease without esophagitis: Secondary | ICD-10-CM

## 2020-04-02 NOTE — Telephone Encounter (Signed)
Requested  medications are  due for refill today yes  Requested medications are on the active medication list yes  Last refill 7/1  Future visit scheduled NO  Notes to clinic No PCP listed, I don't see a visit that mentions this med or dx. Also sees Office manager.

## 2020-04-07 DIAGNOSIS — F331 Major depressive disorder, recurrent, moderate: Secondary | ICD-10-CM | POA: Diagnosis not present

## 2020-04-21 DIAGNOSIS — F331 Major depressive disorder, recurrent, moderate: Secondary | ICD-10-CM | POA: Diagnosis not present

## 2020-05-01 ENCOUNTER — Other Ambulatory Visit: Payer: Self-pay | Admitting: Family Medicine

## 2020-05-01 DIAGNOSIS — I10 Essential (primary) hypertension: Secondary | ICD-10-CM

## 2020-05-01 NOTE — Telephone Encounter (Signed)
Requested  medications are  due for refill today yes  Requested medications are on the active medication list yes  Last refill 8/1  Last visit more than a year ago  Future visit scheduled no  Notes to clinic Failed protocol of visit within 6 months and no upcoming appt scheduled.

## 2020-05-19 ENCOUNTER — Other Ambulatory Visit: Payer: Self-pay | Admitting: Family Medicine

## 2020-05-19 DIAGNOSIS — I10 Essential (primary) hypertension: Secondary | ICD-10-CM

## 2020-05-19 NOTE — Telephone Encounter (Signed)
Medication Refill - Medication: lisinopril-hydrochlorothiazide (ZESTORETIC) 10-12.5 MG tablet    Has the patient contacted their pharmacy? Yes.   (Agent: If no, request that the patient contact the pharmacy for the refill.) (Agent: If yes, when and what did the pharmacy advise?)  Preferred Pharmacy (with phone number or street name):  [id number] [place], [place] - [address]  [address] [place] [address]  Phone: [phone] Fax: [phone]     Agent: Please be advised that RX refills may take up to 3 business days. We ask that you follow-up with your pharmacy.

## 2020-05-24 ENCOUNTER — Telehealth: Payer: BC Managed Care – PPO | Admitting: Gastroenterology

## 2020-05-24 ENCOUNTER — Encounter: Payer: Self-pay | Admitting: General Practice

## 2020-05-26 ENCOUNTER — Other Ambulatory Visit: Payer: Self-pay

## 2020-05-26 DIAGNOSIS — I10 Essential (primary) hypertension: Secondary | ICD-10-CM

## 2020-05-26 MED ORDER — LISINOPRIL-HYDROCHLOROTHIAZIDE 10-12.5 MG PO TABS: 1.0000 | ORAL_TABLET | Freq: Every day | ORAL | 0 refills | Status: DC

## 2020-05-26 NOTE — Telephone Encounter (Signed)
Copied from [facility] [phone]. Topic: General - Other >> May 26, 2020 11:25 AM Leward Quan A wrote: Reason for CRM: Patient called requesting a refill from [facility] on her lisinopril-hydrochlorothiazide (ZESTORETIC) 10-12.5 MG tablet. States that she have been without this medication for 3 weeks and just need enough until her appointmet on 07/19/20 Patient last say Brandi [name] on 05/07/2019. Please advise she can be reached at Ph# [phone] also states that if she does not pick up please leave a detailed message she is working till 5 PM

## 2020-05-30 ENCOUNTER — Ambulatory Visit (INDEPENDENT_AMBULATORY_CARE_PROVIDER_SITE_OTHER): Payer: BC Managed Care – PPO | Admitting: Family Medicine

## 2020-05-30 ENCOUNTER — Encounter: Payer: Self-pay | Admitting: Family Medicine

## 2020-05-30 ENCOUNTER — Other Ambulatory Visit: Payer: Self-pay

## 2020-05-30 VITALS — BP 130/91 | HR 108 | Temp 98.4°F | Resp 20 | Ht 65.0 in | Wt 251.4 lb

## 2020-05-30 DIAGNOSIS — Z79899 Other long term (current) drug therapy: Secondary | ICD-10-CM

## 2020-05-30 DIAGNOSIS — M25562 Pain in left knee: Secondary | ICD-10-CM

## 2020-05-30 DIAGNOSIS — Z8669 Personal history of other diseases of the nervous system and sense organs: Secondary | ICD-10-CM | POA: Insufficient documentation

## 2020-05-30 DIAGNOSIS — M545 Low back pain, unspecified: Secondary | ICD-10-CM

## 2020-05-30 DIAGNOSIS — R635 Abnormal weight gain: Secondary | ICD-10-CM

## 2020-05-30 DIAGNOSIS — I10 Essential (primary) hypertension: Secondary | ICD-10-CM

## 2020-05-30 DIAGNOSIS — G43909 Migraine, unspecified, not intractable, without status migrainosus: Secondary | ICD-10-CM | POA: Insufficient documentation

## 2020-05-30 DIAGNOSIS — G43809 Other migraine, not intractable, without status migrainosus: Secondary | ICD-10-CM | POA: Diagnosis not present

## 2020-05-30 DIAGNOSIS — G8929 Other chronic pain: Secondary | ICD-10-CM

## 2020-05-30 DIAGNOSIS — M25561 Pain in right knee: Secondary | ICD-10-CM

## 2020-05-30 DIAGNOSIS — F3341 Major depressive disorder, recurrent, in partial remission: Secondary | ICD-10-CM

## 2020-05-30 LAB — COMPLETE METABOLIC PANEL WITH GFR
AG Ratio: 1.4 (calc) (ref 1.0–2.5)
ALT: 39 U/L — ABNORMAL HIGH (ref 6–29)
AST: 22 U/L (ref 10–30)
Albumin: 4.4 g/dL (ref 3.6–5.1)
Alkaline phosphatase (APISO): 93 U/L (ref 31–125)
BUN: 7 mg/dL (ref 7–25)
CO2: 21 mmol/L (ref 20–32)
Calcium: 10 mg/dL (ref 8.6–10.2)
Chloride: 105 mmol/L (ref 98–110)
Creat: 0.71 mg/dL (ref 0.50–1.10)
GFR, Est African American: 130 mL/min/{1.73_m2} (ref >=60)
GFR, Est Non African American: 112 mL/min/{1.73_m2} (ref >=60)
Globulin: 3.1 g/dL (calc) (ref 1.9–3.7)
Glucose, Bld: 97 mg/dL (ref 65–99)
Potassium: 4.2 mmol/L (ref 3.5–5.3)
Sodium: 139 mmol/L (ref 135–146)
Total Bilirubin: 0.4 mg/dL (ref 0.2–1.2)
Total Protein: 7.5 g/dL (ref 6.1–8.1)

## 2020-05-30 LAB — CBC WITH DIFFERENTIAL/PLATELET
Absolute Monocytes: 540 cells/uL (ref 200–950)
Basophils Absolute: 40 cells/uL (ref 0–200)
Basophils Relative: 0.4 %
Eosinophils Absolute: 210 cells/uL (ref 15–500)
Eosinophils Relative: 2.1 %
HCT: 49.1 % — ABNORMAL HIGH (ref 35.0–45.0)
Hemoglobin: 16.3 g/dL — ABNORMAL HIGH (ref 11.7–15.5)
Lymphs Abs: 2970 cells/uL (ref 850–3900)
MCH: 30.5 pg (ref 27.0–33.0)
MCHC: 33.2 g/dL (ref 32.0–36.0)
MCV: 91.9 fL (ref 80.0–100.0)
MPV: 11.6 fL (ref 7.5–12.5)
Monocytes Relative: 5.4 %
Neutro Abs: 6240 cells/uL (ref 1500–7800)
Neutrophils Relative %: 62.4 %
Platelets: 409 10*3/uL — ABNORMAL HIGH (ref 140–400)
RBC: 5.34 10*6/uL — ABNORMAL HIGH (ref 3.80–5.10)
RDW: 12.3 % (ref 11.0–15.0)
Total Lymphocyte: 29.7 %
WBC: 10 10*3/uL (ref 3.8–10.8)

## 2020-05-30 LAB — LIPID PANEL
Cholesterol: 197 mg/dL (ref <200)
HDL: 41 mg/dL — ABNORMAL LOW (ref >=50)
LDL Cholesterol (Calc): 123 mg/dL (calc) — ABNORMAL HIGH
Non-HDL Cholesterol (Calc): 156 mg/dL (calc) — ABNORMAL HIGH (ref <130)
Total CHOL/HDL Ratio: 4.8 (calc) (ref <5.0)
Triglycerides: 221 mg/dL — ABNORMAL HIGH (ref <150)

## 2020-05-30 LAB — TSH+FREE T4: TSH W/REFLEX TO FT4: 1.53 mIU/L

## 2020-05-30 MED ORDER — CYCLOBENZAPRINE HCL 10 MG PO TABS: 10.0000 mg | ORAL_TABLET | Freq: Three times a day (TID) | ORAL | 1 refills | Status: DC | PRN

## 2020-05-30 MED ORDER — RIZATRIPTAN BENZOATE 10 MG PO TABS: 10.0000 mg | ORAL_TABLET | ORAL | 0 refills | Status: DC | PRN

## 2020-05-30 NOTE — Assessment & Plan Note (Signed)
Has not met with orthopedics in the past, reports pain > 1 year.  Has been taking Baclofen without improvement in symptoms over this past year.  Reports childhood history of mild scoliosis and is unsure if this is contributing.  Likely morbid obesity contributing to back pain as well.  May benefit from Orthopedic evaluation and possible physical therapy.  Plan: 1. STOP baclofen and begin taking cyclobenzaprine 19m TID PRN 2. Referral to orthopedics placed

## 2020-05-30 NOTE — Assessment & Plan Note (Signed)
Reports pain > 1 year, likely secondary to sports when younger (played Softball) in combination with morbid obesity.  Has not met with orthopedics in the past, will place referral and they will likely refer for PT  Plan: 1. Referral to orthopedics placed 

## 2020-05-30 NOTE — Assessment & Plan Note (Signed)
Migraines at worst are approx 6x per month, discussed abortive medications such as sumatriptan and rizatriptan.  Interested in trial of rizatriptan.  Plan: 1. Begin to increase your water intake 2. Can take rizatriptan 10mg  at onset of migraine, repeating in 2 hours if no relief.  No more than 2 tablets in a 24 hour period and 10 tablets in a month. 3. RTC in 3 months, sooner, should the need arise

## 2020-05-30 NOTE — Patient Instructions (Signed)
STOP the baclofen and can start on cyclobenzaprine 10mg  up to 3x per day as needed for muscle cramp/spasms/back pain.  Be sure to take your first dose at home until you know how this will effect you.  It can cause drowsiness/sedation so avoid driving or operating heavy machinery while taking this.  A referral to Orthopedics has been placed today.  If you have not heard from the specialty office or our referral coordinator within 1 week, please let us know and we will follow up with the referral coordinator for an update.  I have sent in a prescription for rizatriptan, to take 1 tablet at the onset of a migraine and can repeat in 2 hours, no more than 2 tablets in a 24 hour period and no more than 10 tablets in 1 month  We will plan to see you back in 4 weeks for your physical and PAP testing  You will receive a survey after today's visit either digitally by e-mail or paper by [place] mail. Your experiences and feedback matter to [place].  Please respond so we know how we are doing as we provide care for you.  Call us with any questions/concerns/needs.  It is my goal to be available to you for your health concerns.  Thanks for choosing me to be a partner in your healthcare needs!  Harlin Rain, FNP-C Family Nurse Practitioner [facility] Phone: [phone]

## 2020-05-30 NOTE — Assessment & Plan Note (Signed)
Slightly elevated BP in clinic today. Pt is not working on lifestyle modifications.  Taking medications tolerating well without side effects.  Complications:  Morbid obesity, GERD   Plan: 1. Continue taking lisinopril-HCTZ 10-12.5mg  2. Obtain labs today  3. Encouraged heart healthy diet and increasing exercise to 30 minutes most days of the week, going no more than 2 days in a row without exercise. 4. Check BP 1-2 x per week at home, keep log, and bring to clinic at next appointment. 5. Follow up 3 months.

## 2020-05-30 NOTE — Progress Notes (Signed)
Subjective:    Patient ID: Brandi [name], female    DOB: 11-23-1985, 34 y.o.   MRN: [id number]  Brandi [name] is a 34 y.o. female presenting on 05/30/2020 for Knee Pain (Chronic Intermittent Rt knee x 1 yr. Pt state she experience a intermittent sharp pain that make her knee give out .), Back Pain (chronic back pain, she state it lower Lt side back spasm that mke it difficult to stand or get out of bed on days.), and Migraine (intermittent migraines that only happen over her Right eye. Sensitivity to light, sounds. Also experiencing cluster headaches only on the right side )   HPI  Brandi [name] presents to clinic for a refill on her muscle relaxer for her right knee and lower back pain.  Reports that she has had this pain for > 1 year, has not had much relief with the baclofen in the past.  Has not met with an orthopedic for this in the past.  Has been having migraines that range anywhere from 1-2x per month to maybe 5-6x per month.  Takes acetaminophen and ibuprofen when she has a migraine that comes on and they are typically responsive and resolve.  Has not been on preventative or abortive migraine medication in the past.  Depression screen [facility] 2/9 05/07/2019 03/24/2019 10/22/2018  Decreased Interest 1 0 1  Down, Depressed, Hopeless 1 0 0  PHQ - 2 Score 2 0 1  Altered sleeping _0 Tired, decreased energy _1 Change in appetite _2 Feeling bad or failure about yourself  3 3 0  Trouble concentrating 1 0 0  Moving slowly or fidgety/restless 3 0 0  Suicidal thoughts 3 0 0  PHQ-9 Score _3 Difficult doing work/chores Somewhat difficult Not difficult at all Not difficult at all    Social History   Tobacco Use  . Smoking status: Current Every Day Smoker    Packs/day: 1.00    Years: 10.00    Pack years: 10.00    Types: Cigarettes  . Smokeless tobacco: Never Used  Vaping Use  . Vaping Use: Never used  Substance Use Topics  . Alcohol use: Yes    Alcohol/week: 1.0  standard drink    Types: 1 Glasses of wine per week    Comment: rare  . Drug use: Yes    Frequency: 7.0 times per week    Types: Marijuana    Comment: once daily at night (pt admits is self-medication for depression/insomnia)    Review of Systems  Constitutional: Negative.   HENT: Negative.   Eyes: Negative.   Respiratory: Negative.   Cardiovascular: Negative.   Gastrointestinal: Negative.   Endocrine: Negative.   Genitourinary: Negative.   Musculoskeletal: Positive for arthralgias and back pain. Negative for gait problem, joint swelling, myalgias, neck pain and neck stiffness.  Skin: Negative.   Allergic/Immunologic: Negative.   Neurological: Positive for headaches. Negative for dizziness, tremors, seizures, syncope, facial asymmetry, speech difficulty, weakness, light-headedness and numbness.  Hematological: Negative.   Psychiatric/Behavioral: Negative.    Per HPI unless specifically indicated above     Objective:    BP (!) 130/91 (BP Location: Right Arm, Patient Position: Sitting, Cuff Size: Large)   Pulse (!) 108   Temp 98.4 F (36.9 C) (Oral)   Resp 20   Ht _4  (1.651 m)   Wt 251 lb 6.4 oz (114 kg)   SpO2 98%   BMI 41.84 kg/m  Wt Readings from Last 3 Encounters:  05/30/20 251 lb 6.4 oz (114 kg)  02/22/20 255 lb 6 oz (115.8 kg)  09/10/19 239 lb (108.4 kg)    Physical Exam Vitals reviewed.  Constitutional:      General: She is not in acute distress.    Appearance: Normal appearance. She is well-developed and well-groomed. She is morbidly obese. She is not ill-appearing or toxic-appearing.  HENT:     Head: Normocephalic and atraumatic.     Nose:     Comments: Brandi [name] is in place, covering mouth and nose. Eyes:     General: Lids are normal. Vision grossly intact.        Right eye: No discharge.        Left eye: No discharge.     Extraocular Movements: Extraocular movements intact.     Conjunctiva/sclera: Conjunctivae normal.     Pupils: Pupils are  equal, round, and reactive to light.  Cardiovascular:     Rate and Rhythm: Normal rate and regular rhythm.     Pulses: Normal pulses.          Dorsalis pedis pulses are 2+ on the right side and 2+ on the left side.     Heart sounds: Normal heart sounds. No murmur heard.  No friction rub. No gallop.   Pulmonary:     Effort: Pulmonary effort is normal. No respiratory distress.     Breath sounds: Normal breath sounds.  Musculoskeletal:        General: Tenderness present.     Lumbar back: Spasms and tenderness present. Negative right straight leg raise test and negative left straight leg raise test.     Right lower leg: Tenderness present. No edema.     Left lower leg: Normal. No edema.  Skin:    General: Skin is warm and dry.     Capillary Refill: Capillary refill takes less than 2 seconds.  Neurological:     General: No focal deficit present.     Mental Status: She is alert and oriented to person, place, and time.  Psychiatric:        Attention and Perception: Attention and perception normal.        Mood and Affect: Mood and affect normal.        Speech: Speech normal.        Behavior: Behavior normal. Behavior is cooperative.        Thought Content: Thought content normal.        Cognition and Memory: Cognition and memory normal.        Judgment: Judgment normal.    Results for orders placed or performed in visit on 02/22/20  H. pylori antibody, IgG  Result Value Ref Range   H. pylori, IgG AbS 0.25 0.00 - 0.79 Index Value  Celiac Disease Panel  Result Value Ref Range   Endomysial IgA Negative Negative   Transglutaminase IgA <2 0 - 3 U/mL   IgA/Immunoglobulin A, Serum 496 (H) 87 - 352 mg/dL  H. pylori breath test  Result Value Ref Range   H pylori Breath Test Negative Negative      Assessment & Plan:   Problem List Items Addressed This Visit      Cardiovascular and Mediastinum   Essential hypertension    Slightly elevated BP in clinic today. Pt is not working on  lifestyle modifications.  Taking medications tolerating well without side effects.  Complications:  Morbid obesity, GERD   Plan: 1. Continue taking lisinopril-HCTZ 10-12.5mg   2. Obtain labs today  3. Encouraged heart healthy diet and increasing exercise to 30 minutes most days of the week, going no more than 2 days in a row without exercise. 4. Check BP 1-2 x per week at home, keep log, and bring to clinic at next appointment. 5. Follow up 3 months.       Relevant Orders   CBC with Differential   COMPLETE METABOLIC PANEL WITH GFR   Migraine    Migraines at worst are approx 6x per month, discussed abortive medications such as sumatriptan and rizatriptan.  Interested in trial of rizatriptan.  Plan: 1. Begin to increase your water intake 2. Can take rizatriptan 29m at onset of migraine, repeating in 2 hours if no relief.  No more than 2 tablets in a 24 hour period and 10 tablets in a month. 3. RTC in 3 months, sooner, should the need arise      Relevant Medications   cyclobenzaprine (FLEXERIL) 10 MG tablet   rizatriptan (MAXALT) 10 MG tablet     Other   Recurrent major depressive disorder, in partial remission (HCC)   Chronic lower back pain - Primary    Has not met with orthopedics in the past, reports pain > 1 year.  Has been taking Baclofen without improvement in symptoms over this past year.  Reports childhood history of mild scoliosis and is unsure if this is contributing.  Likely morbid obesity contributing to back pain as well.  May benefit from Orthopedic evaluation and possible physical therapy.  Plan: 1. STOP baclofen and begin taking cyclobenzaprine 13mTID PRN 2. Referral to orthopedics placed      Relevant Medications   cyclobenzaprine (FLEXERIL) 10 MG tablet   Other Relevant Orders   AMB referral to orthopedics   Chronic pain of both knees    Reports pain > 1 year, likely secondary to [place] younger [place] with morbid [place].  Has  not met with orthopedics in the past, will place referral and they will likely refer for PT  Plan: 1. Referral to orthopedics placed      Relevant Medications   cyclobenzaprine (FLEXERIL) 10 MG tablet   Other Relevant Orders   AMB referral to orthopedics    Other Visit Diagnoses    Weight gain       Relevant Orders   TSH + free T4   Long-term use of high-risk medication       Relevant Orders   Lipid Profile      Meds ordered this encounter  Medications  . cyclobenzaprine (FLEXERIL) 10 MG tablet    Sig: Take 1 tablet (10 mg total) by mouth 3 (three) times daily as needed for muscle spasms.    Dispense:  90 tablet    Refill:  1  . rizatriptan (MAXALT) 10 MG tablet    Sig: Take 1 tablet (10 mg total) by mouth as needed for migraine. May repeat in 2 hours if needed    Dispense:  10 tablet    Refill:  0     Follow up plan: Return in about 1 month (around 06/29/2020) for CPE and PAP testing.   NiHarlin RainFNBurlingameamily Nurse Practitioner [facility] 05/30/2020, 12:22 PM

## 2020-05-31 ENCOUNTER — Encounter: Payer: Self-pay | Admitting: Family Medicine

## 2020-06-02 DIAGNOSIS — F331 Major depressive disorder, recurrent, moderate: Secondary | ICD-10-CM | POA: Diagnosis not present

## 2020-06-13 DIAGNOSIS — M5136 Other intervertebral disc degeneration, lumbar region: Secondary | ICD-10-CM | POA: Diagnosis not present

## 2020-06-13 DIAGNOSIS — M25561 Pain in right knee: Secondary | ICD-10-CM | POA: Diagnosis not present

## 2020-06-16 ENCOUNTER — Telehealth: Payer: Self-pay

## 2020-06-16 NOTE — Telephone Encounter (Signed)
Patient is requesting a extension on for ADA forms. She states she wants this for 3 more months so she is able to go to the bathroom when she needs to go. Please advised

## 2020-06-16 NOTE — Telephone Encounter (Signed)
Per Dr. Marius Ditch patient needs a follow up appointment. Patient no show her last appointment. Called and left a message for call back

## 2020-06-17 DIAGNOSIS — F331 Major depressive disorder, recurrent, moderate: Secondary | ICD-10-CM | POA: Diagnosis not present

## 2020-06-17 NOTE — Telephone Encounter (Signed)
Called and left a detail message for call back.. Sent patient a Therapist, music

## 2020-06-18 ENCOUNTER — Other Ambulatory Visit: Payer: Self-pay | Admitting: Family Medicine

## 2020-06-18 DIAGNOSIS — I10 Essential (primary) hypertension: Secondary | ICD-10-CM

## 2020-06-22 ENCOUNTER — Other Ambulatory Visit: Payer: Self-pay | Admitting: Family Medicine

## 2020-06-22 DIAGNOSIS — G43809 Other migraine, not intractable, without status migrainosus: Secondary | ICD-10-CM

## 2020-06-22 NOTE — Telephone Encounter (Signed)
Requested Prescriptions  Pending Prescriptions Disp Refills  . rizatriptan (MAXALT) 10 MG tablet [Pharmacy Med Name: RIZATRIPTAN 10 MG TABLET] 10 tablet 0    Sig: TAKE 1 TABLET BY MOUTH AS NEEDED FOR MIGRAINE. MAY REPEAT IN 2 HOURS IF NEEDED     Neurology:  Migraine Therapy - Triptan Failed - 06/22/2020  1:07 AM      Failed - Last BP in normal range    BP Readings from Last 1 Encounters:  05/30/20 (!) 130/91         Passed - Valid encounter within last 12 months    Recent Outpatient Visits          3 weeks ago Chronic bilateral low back pain without sciatica   [facility], Lupita Raider, FNP   1 year ago Scoliosis, unspecified scoliosis type, unspecified spinal region   [facility] Puma, Jerrel Ivory, NP   1 year ago Encounter for surveillance of injectable contraceptive   [facility] [employer]key [employer], NP   1 year ago Acute cystitis without hematuria   [facility] [employer], NP   1 year ago Irritable bowel syndrome with diarrhea   [facility] Merrilyn Puma, Jerrel Ivory, NP      Future Appointments            In 1 week Vanga, Tally Due, MD [place]

## 2020-06-30 DIAGNOSIS — F331 Major depressive disorder, recurrent, moderate: Secondary | ICD-10-CM | POA: Diagnosis not present

## 2020-07-04 ENCOUNTER — Ambulatory Visit (INDEPENDENT_AMBULATORY_CARE_PROVIDER_SITE_OTHER): Payer: BC Managed Care – PPO | Admitting: Gastroenterology

## 2020-07-04 ENCOUNTER — Other Ambulatory Visit: Payer: Self-pay

## 2020-07-04 ENCOUNTER — Encounter: Payer: Self-pay | Admitting: Gastroenterology

## 2020-07-04 VITALS — BP 138/85 | HR 111 | Temp 98.0°F | Ht 65.0 in | Wt 252.4 lb

## 2020-07-04 DIAGNOSIS — K580 Irritable bowel syndrome with diarrhea: Secondary | ICD-10-CM | POA: Diagnosis not present

## 2020-07-04 MED ORDER — DESIPRAMINE HCL 25 MG PO TABS: 25.0000 mg | ORAL_TABLET | Freq: Every day | ORAL | 2 refills | Status: DC

## 2020-07-04 NOTE — Progress Notes (Signed)
Cephas Darby, MD [address]  [place]  [place], [place] [address]  Main: [phone]  Fax: [phone]    Gastroenterology Consultation  Referring Provider:     Verl Bangs, FNP Primary Care Physician:  Verl Bangs, FNP Primary Gastroenterologist:  Dr. Cephas Darby Reason for Consultation:     IBS diarrhea        HPI:   Brandi [name] is a 34 y.o. female referred by Dr. Verl Bangs, FNP  for consultation & management of several years history of abdominal cramps, nonbloody diarrhea and bloating.  She has been dealing with these symptoms for several years, tried Imodium in the past, diarrhea temporarily improved.  Dicyclomine helps to some extent.  She tried IBgard which does provide symptom relief.  She gained weight in last few years as well as going through significant anxiety and depression.  She is currently treated with Cymbalta.  She consumes sweetened tea, carbonated beverages regularly.  Her symptoms are almost on a daily basis significantly impacting her personal and professional life, losing at least 1 hour/week of her work time.  She is worried about losing her job due to frequent breaks she has to take to use bathroom.  She is also complaining of rectal discomfort, perianal itching and irritation from her hemorrhoids due to increased bowel frequency  Follow-up phone visit 12/09/2018 I initially saw Aminta about a month ago for IBS diarrhea.  At that time, I advised her to start amitriptyline 25 mg at bedtime as well as eliminate carbonated beverages.  She has been taking amitriptyline and noticed improvement in her symptoms in 1 week.  She also significantly cut back on carbonated beverages.  She is glad that her symptoms are under control.  She had a UTI for which she had to take antibiotic which resulted in severe antibiotic associated diarrhea, subsided after completing the course of antibiotics.  Currently, she is having regular bowel movements,  formed and no other GI symptoms.  She also stopped Cymbalta as she is on amitriptyline.  She reports that her rectal bleeding significantly improved since control of her diarrhea.  She is not needing Bentyl  Follow-up visit 02/22/2020 Patient is here for follow-up of her IBS symptoms.  She did not see me for 1 year.  She reports that amitriptyline did help but it resulted in significant daytime drowsiness interfering with her work.  She cut down to 12.5 mg which did not help with her IBS symptoms.  She does report epigastric fullness associated with significant bloating, gas and increased bowel frequency.  She is taking Bentyl 10 mg as needed which she thinks she may need higher dose.  She has gained about 15 pounds since last visit.  She thinks her weight gain is secondary to Depo which she is planning to discontinue.  She is also trying to find new PCP.  She said eating cheese is her weakness and unable to cut down on it She thinks she is more depressed than before.  Zoloft helped her depression in the past but made her IBS symptoms worse.  He is also requesting to fill out the paperwork to submit to her workplace which can accommodate at least an hour per week  for frequent clock out She stopped drinking carbonated beverages, cut down on red meat She smokes cigarettes She denies drinking alcohol  Follow-up visit 07/04/2020 Patient continues to experience IBS-diarrhea symptoms.  She reports having bowel movements anywhere from 1-5 daily, she  reports ongoing significant stress at work which is the main trigger for her IBS symptoms.  She is thinking to find a new job.  She does report significant financial stress in her life.  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  She has tried IBgard which does provide some relief but causes heartburn, taking Tums as needed.  She is also requesting to extend the disability for bathroom needs given her ongoing IBS symptoms.  She does acknowledge having  anxiety and depression which are not well under control.  She is seeing a therapist, unable to start any medication.  She tried amitriptyline low-dose which led to daytime sleepiness. She is drinking 1 soda per day.  Trying to cut back on red meat, she has joined a support group for IBS symptoms, trying to follow a low FODMAPs diet which she feels is quite challenging.  Her weight has been stable  NSAIDs: None  Antiplts/Anticoagulants/Anti thrombotics: None  GI Procedures: None She denies family history of celiac disease, GI malignancy, inflammatory bowel disease  Past Medical History:  Diagnosis Date  . Abnormal Pap smear of cervix 12/2015   ascus/pos- achd  . Chlamydial cervicitis   . Depression    pt has past treatment w/ Zoloft - good symptom control, but worsening IBS symptoms  . Genital HSV   . GERD (gastroesophageal reflux disease)   . Hypertension   . IBS (irritable bowel syndrome)   . Trichomoniasis     Past Surgical History:  Procedure Laterality Date  . NO PAST SURGERIES     Current Outpatient Medications:  .  Albuterol Sulfate 108 (90 Base) MCG/ACT AEPB, Inhale into the lungs., Disp: , Rfl:  .  cyclobenzaprine (FLEXERIL) 10 MG tablet, Take 1 tablet (10 mg total) by mouth 3 (three) times daily as needed for muscle spasms., Disp: 90 tablet, Rfl: 1 .  dicyclomine (BENTYL) 10 MG capsule, Take 1 capsule (10 mg total) by mouth 4 (four) times daily -  before meals and at bedtime., Disp: 90 capsule, Rfl: 0 .  fluticasone (FLONASE) 50 MCG/ACT nasal spray, Place 1 spray into both nostrils 2 (two) times daily., Disp: , Rfl:  .  hydrocortisone (ANUSOL-HC) 2.5 % rectal cream, Place 1 application rectally 2 (two) times daily as needed for hemorrhoids or anal itching., Disp: 30 g, Rfl: 0 .  lisinopril-hydrochlorothiazide (ZESTORETIC) 10-12.5 MG tablet, TAKE 1 TABLET BY MOUTH EVERY DAY, Disp: 90 tablet, Rfl: 0 .  Multiple Vitamins-Minerals (WOMENS DAILY FORMULA PO), Take by mouth.,  Disp: , Rfl:  .  omeprazole (PRILOSEC) 20 MG capsule, TAKE 1 CAPSULE BY MOUTH EVERY DAY (Patient taking differently: Take 20 mg by mouth every other day. ), Disp: 30 capsule, Rfl: 3 .  rizatriptan (MAXALT) 10 MG tablet, TAKE 1 TABLET BY MOUTH AS NEEDED FOR MIGRAINE. MAY REPEAT IN 2 HOURS IF NEEDED, Disp: 10 tablet, Rfl: 0 .  vitamin B-12 (CYANOCOBALAMIN) 500 MCG tablet, Take 500 mcg by mouth daily., Disp: , Rfl:  .  desipramine (NORPRAMIN) 25 MG tablet, Take 1 tablet (25 mg total) by mouth daily., Disp: 30 tablet, Rfl: 2   Family History  Problem Relation Age of Onset  . Diabetes Mother   . Hypertension Mother   . Heart disease Father   . Heart attack Father   . Heart disease Maternal Grandfather   . Heart disease Paternal Grandfather   . Hypertension Sister   . Lung cancer Maternal Grandmother   . Lung cancer Paternal Grandmother   .  ADD / ADHD Son   . Cancer Neg Hx      Social History   Tobacco Use  . Smoking status: Current Every Day Smoker    Packs/day: 1.00    Years: 10.00    Pack years: 10.00    Types: Cigarettes  . Smokeless tobacco: Never Used  Vaping Use  . Vaping Use: Never used  Substance Use Topics  . Alcohol use: Yes    Alcohol/week: 1.0 standard drink    Types: 1 Glasses of wine per week    Comment: rare  . Drug use: Yes    Frequency: 7.0 times per week    Types: Marijuana    Comment: once daily at night (pt admits is self-medication for depression/insomnia)    Allergies as of 07/04/2020 - Review Complete 07/04/2020  Allergen Reaction Noted  . Amoxicillin Nausea And Vomiting and Rash 02/08/2016    Review of Systems:    All systems reviewed and negative except where noted in HPI.   Physical Exam:  BP 138/85 (BP Location: Left Arm, Patient Position: Sitting, Cuff Size: Normal)   Pulse (!) 111   Temp 98 F (36.7 C) (Oral)   Ht 5\' 5"  (1.651 m)   Wt 252 lb 6 oz (114.5 kg)   BMI 42.00 kg/m  No LMP recorded.  General:   Alert,  Well-developed,  well-nourished, pleasant and cooperative in NAD Head:  Normocephalic and atraumatic. Eyes:  Sclera clear, no icterus.   Conjunctiva pink. Ears:  Normal auditory acuity. Nose:  No deformity, discharge, or lesions. Mouth:  No deformity or lesions,oropharynx pink & moist. Neck:  Supple; no masses or thyromegaly. Lungs:  Respirations even and unlabored.  Clear throughout to auscultation.   No wheezes, crackles, or rhonchi. No acute distress. Heart:  Regular rate and rhythm; no murmurs, clicks, rubs, or gallops. Abdomen:  Normal bowel sounds. Soft, obese, non-tender and moderately distended, tympanic to percussion without masses, hepatosplenomegaly or hernias noted.  No guarding or rebound tenderness.   Rectal: Not performed Msk:  Symmetrical without gross deformities. Good, equal movement & strength bilaterally. Pulses:  Normal pulses noted. Extremities:  No clubbing or edema.  No cyanosis. Neurologic:  Alert and oriented x3;  grossly normal neurologically. Skin:  Intact without significant lesions or rashes. No jaundice. Psych:  Alert and cooperative. Normal mood and affect.  Imaging Studies: No abdominal imaging  Assessment and Plan:   TITA TERHAAR is a 34 y.o. female with obesity, anxiety and depression, several years history of abdominal cramps, nonbloody diarrhea, bloating without any constitutional symptoms.  Her symptoms are consistent with diarrhea predominant IBS.   IBS-diarrhea: Not well controlled, stress is an important trigger, uncontrolled anxiety and depression Trial of rifaximin 550 mg 3 times daily for 2 weeks, led to severe postprandial diarrhea, did not help, took only for 1 week Trial of IBgard, modest relief, samples provided Did not tolerate amitriptyline due to daytime drowsiness Trial of low-dose desipramine H. pylori IgG, H. pylori breath test and screen for celiac disease -unremarkable Discussed about increase physical activity, diet and exercise to lose  weight Renew her ADA forms Advised her to seek [facility] with [facility] which [facility] an [facility] center for management of anxiety and depression Consider further evaluation such as fecal calprotectin levels, food allergy profile, alpha gal panel during next visit   Follow up in 3 months   Cephas Darby, MD

## 2020-07-04 NOTE — Patient Instructions (Addendum)
[facility] Address: [address], [place],  [address]. Phone: [phone]

## 2020-07-12 DIAGNOSIS — R293 Abnormal posture: Secondary | ICD-10-CM | POA: Diagnosis not present

## 2020-07-12 DIAGNOSIS — M222X1 Patellofemoral disorders, right knee: Secondary | ICD-10-CM | POA: Diagnosis not present

## 2020-07-12 DIAGNOSIS — M5459 Other low back pain: Secondary | ICD-10-CM | POA: Diagnosis not present

## 2020-07-14 DIAGNOSIS — F331 Major depressive disorder, recurrent, moderate: Secondary | ICD-10-CM | POA: Diagnosis not present

## 2020-07-19 ENCOUNTER — Ambulatory Visit: Payer: BC Managed Care – PPO | Admitting: Family Medicine

## 2020-07-19 DIAGNOSIS — R293 Abnormal posture: Secondary | ICD-10-CM | POA: Diagnosis not present

## 2020-07-19 DIAGNOSIS — M222X1 Patellofemoral disorders, right knee: Secondary | ICD-10-CM | POA: Diagnosis not present

## 2020-07-19 DIAGNOSIS — M5459 Other low back pain: Secondary | ICD-10-CM | POA: Diagnosis not present

## 2020-07-24 DIAGNOSIS — J0110 Acute frontal sinusitis, unspecified: Secondary | ICD-10-CM | POA: Diagnosis not present

## 2020-07-24 DIAGNOSIS — Z03818 Encounter for observation for suspected exposure to other biological agents ruled out: Secondary | ICD-10-CM | POA: Diagnosis not present

## 2020-07-25 DIAGNOSIS — F331 Major depressive disorder, recurrent, moderate: Secondary | ICD-10-CM | POA: Diagnosis not present

## 2020-07-26 DIAGNOSIS — R293 Abnormal posture: Secondary | ICD-10-CM | POA: Diagnosis not present

## 2020-07-26 DIAGNOSIS — M222X1 Patellofemoral disorders, right knee: Secondary | ICD-10-CM | POA: Diagnosis not present

## 2020-07-26 DIAGNOSIS — M5459 Other low back pain: Secondary | ICD-10-CM | POA: Diagnosis not present

## 2020-08-02 DIAGNOSIS — M5459 Other low back pain: Secondary | ICD-10-CM | POA: Diagnosis not present

## 2020-08-02 DIAGNOSIS — M222X1 Patellofemoral disorders, right knee: Secondary | ICD-10-CM | POA: Diagnosis not present

## 2020-08-02 DIAGNOSIS — R293 Abnormal posture: Secondary | ICD-10-CM | POA: Diagnosis not present

## 2020-08-04 ENCOUNTER — Other Ambulatory Visit: Payer: Self-pay | Admitting: Family Medicine

## 2020-08-04 DIAGNOSIS — M545 Low back pain, unspecified: Secondary | ICD-10-CM

## 2020-08-04 DIAGNOSIS — G8929 Other chronic pain: Secondary | ICD-10-CM

## 2020-08-04 NOTE — Telephone Encounter (Signed)
Requested medications are due for refill today yes  Requested medications are on the active medication list yes  Last refill 10/24  Last visit 9/27  Notes to clinic Not Delegated

## 2020-08-09 DIAGNOSIS — M5459 Other low back pain: Secondary | ICD-10-CM | POA: Diagnosis not present

## 2020-08-09 DIAGNOSIS — R293 Abnormal posture: Secondary | ICD-10-CM | POA: Diagnosis not present

## 2020-08-09 DIAGNOSIS — M222X1 Patellofemoral disorders, right knee: Secondary | ICD-10-CM | POA: Diagnosis not present

## 2020-08-12 DIAGNOSIS — F331 Major depressive disorder, recurrent, moderate: Secondary | ICD-10-CM | POA: Diagnosis not present

## 2020-08-16 ENCOUNTER — Encounter: Payer: Self-pay | Admitting: Family Medicine

## 2020-08-16 DIAGNOSIS — K219 Gastro-esophageal reflux disease without esophagitis: Secondary | ICD-10-CM

## 2020-08-18 MED ORDER — OMEPRAZOLE 20 MG PO CPDR: 20.0000 mg | DELAYED_RELEASE_CAPSULE | Freq: Every day | ORAL | 1 refills | Status: DC

## 2020-09-15 DIAGNOSIS — F331 Major depressive disorder, recurrent, moderate: Secondary | ICD-10-CM | POA: Diagnosis not present

## 2020-09-25 ENCOUNTER — Other Ambulatory Visit: Payer: Self-pay | Admitting: Family Medicine

## 2020-09-25 DIAGNOSIS — I10 Essential (primary) hypertension: Secondary | ICD-10-CM

## 2020-10-04 ENCOUNTER — Encounter: Payer: Self-pay | Admitting: Gastroenterology

## 2020-10-04 ENCOUNTER — Other Ambulatory Visit: Payer: Self-pay

## 2020-10-04 ENCOUNTER — Ambulatory Visit (INDEPENDENT_AMBULATORY_CARE_PROVIDER_SITE_OTHER): Payer: BC Managed Care – PPO | Admitting: Gastroenterology

## 2020-10-04 VITALS — BP 146/96 | HR 98 | Temp 98.2°F | Ht 65.0 in | Wt 253.0 lb

## 2020-10-04 DIAGNOSIS — K580 Irritable bowel syndrome with diarrhea: Secondary | ICD-10-CM

## 2020-10-04 NOTE — Progress Notes (Signed)
Cephas Darby, MD [address]  [place]  [place], [place] [address]  Main: [phone]  Fax: [phone]    Gastroenterology Consultation  Referring Provider:     Verl Bangs, FNP Primary Care Physician:  Verl Bangs, FNP Primary Gastroenterologist:  Dr. Cephas Darby Reason for Consultation:     IBS diarrhea        HPI:   Brandi [name] is a 35 y.o. female referred by Dr. Verl Bangs, FNP  for consultation & management of several years history of abdominal cramps, nonbloody diarrhea and bloating.  She has been dealing with these symptoms for several years, tried Imodium in the past, diarrhea temporarily improved.  Dicyclomine helps to some extent.  She tried IBgard which does provide symptom relief.  She gained weight in last few years as well as going through significant anxiety and depression.  She is currently treated with Cymbalta.  She consumes sweetened tea, carbonated beverages regularly.  Her symptoms are almost on a daily basis significantly impacting her personal and professional life, losing at least 1 hour/week of her work time.  She is worried about losing her job due to frequent breaks she has to take to use bathroom.  She is also complaining of rectal discomfort, perianal itching and irritation from her hemorrhoids due to increased bowel frequency  Follow-up phone visit 12/09/2018 I initially saw Brandi [name] about a month ago for IBS diarrhea.  At that time, I advised her to start amitriptyline 25 mg at bedtime as well as eliminate carbonated beverages.  She has been taking amitriptyline and noticed improvement in her symptoms in 1 week.  She also significantly cut back on carbonated beverages.  She is glad that her symptoms are under control.  She had a UTI for which she had to take antibiotic which resulted in severe antibiotic associated diarrhea, subsided after completing the course of antibiotics.  Currently, she is having regular bowel movements,  formed and no other GI symptoms.  She also stopped Cymbalta as she is on amitriptyline.  She reports that her rectal bleeding significantly improved since control of her diarrhea.  She is not needing Bentyl  Follow-up visit 02/22/2020 Patient is here for follow-up of her IBS symptoms.  She did not see me for 1 year.  She reports that amitriptyline did help but it resulted in significant daytime drowsiness interfering with her work.  She cut down to 12.5 mg which did not help with her IBS symptoms.  She does report epigastric fullness associated with significant bloating, gas and increased bowel frequency.  She is taking Bentyl 10 mg as needed which she thinks she may need higher dose.  She has gained about 15 pounds since last visit.  She thinks her weight gain is secondary to Depo which she is planning to discontinue.  She is also trying to find new PCP.  She said eating cheese is her weakness and unable to cut down on it She thinks she is more depressed than before.  Zoloft helped her depression in the past but made her IBS symptoms worse.  He is also requesting to fill out the paperwork to submit to her workplace which can accommodate at least an hour per week  for frequent clock out She stopped drinking carbonated beverages, cut down on red meat She smokes cigarettes She denies drinking alcohol  Follow-up visit 07/04/2020 Patient continues to experience IBS-diarrhea symptoms.  She reports having bowel movements anywhere from 1-5 daily, she  reports ongoing significant stress at work which is the main trigger for her IBS symptoms.  She is thinking to find a new job.  She does report significant financial stress in her life.  She is a single mom, taking care of 44 year old boy.  She has tried IBgard which does provide some relief but causes heartburn, taking Tums as needed.  She is also requesting to extend the disability for bathroom needs given her ongoing IBS symptoms.  She does acknowledge having  anxiety and depression which are not well under control.  She is seeing a therapist, unable to start any medication.  She tried amitriptyline low-dose which led to daytime sleepiness. She is drinking 1 soda per day.  Trying to cut back on red meat, she has joined a support group for IBS symptoms, trying to follow a low FODMAPs diet which she feels is quite challenging.  Her weight has been stable  Follow-up visit 10/04/2020 Patient reports that her IBS symptoms are on and off, has normal days and then a week later episodes of nonbloody diarrhea.  Her weight is stable.  She cannot figure out any particular food that she is not able to tolerate.  She reports that she cannot tolerate most of the meat.  She is happy that she just started doing exercise.  She has the forms to fill out, requesting to extend her ADA for 6 months instead of 3 months.  NSAIDs: None  Antiplts/Anticoagulants/Anti thrombotics: None  GI Procedures: None She denies family history of celiac disease, GI malignancy, inflammatory bowel disease  Past Medical History:  Diagnosis Date  . Abnormal Pap smear of cervix 12/2015   ascus/pos- achd  . Chlamydial cervicitis   . Depression    pt has past treatment w/ Zoloft - good symptom control, but worsening IBS symptoms  . Genital HSV   . GERD (gastroesophageal reflux disease)   . Hypertension   . IBS (irritable bowel syndrome)   . Trichomoniasis     Past Surgical History:  Procedure Laterality Date  . NO PAST SURGERIES     Current Outpatient Medications:  .  Albuterol Sulfate 108 (90 Base) MCG/ACT AEPB, Inhale into the lungs., Disp: , Rfl:  .  cyclobenzaprine (FLEXERIL) 10 MG tablet, TAKE 1 TABLET BY MOUTH THREE TIMES A DAY AS NEEDED FOR MUSCLE SPASMS, Disp: 90 tablet, Rfl: 1 .  dicyclomine (BENTYL) 10 MG capsule, Take 1 capsule (10 mg total) by mouth 4 (four) times daily -  before meals and at bedtime., Disp: 90 capsule, Rfl: 0 .  fluticasone (FLONASE) 50 MCG/ACT nasal  spray, Place 1 spray into both nostrils 2 (two) times daily., Disp: , Rfl:  .  hydrocortisone (ANUSOL-HC) 2.5 % rectal cream, Place 1 application rectally 2 (two) times daily as needed for hemorrhoids or anal itching., Disp: 30 g, Rfl: 0 .  lisinopril-hydrochlorothiazide (ZESTORETIC) 10-12.5 MG tablet, TAKE 1 TABLET BY MOUTH EVERY DAY, Disp: 90 tablet, Rfl: 0 .  Multiple Vitamins-Minerals (WOMENS DAILY FORMULA PO), Take by mouth., Disp: , Rfl:  .  omeprazole (PRILOSEC) 20 MG capsule, Take 1 capsule (20 mg total) by mouth daily., Disp: 90 capsule, Rfl: 1 .  rizatriptan (MAXALT) 10 MG tablet, TAKE 1 TABLET BY MOUTH AS NEEDED FOR MIGRAINE. MAY REPEAT IN 2 HOURS IF NEEDED, Disp: 10 tablet, Rfl: 0 .  vitamin B-12 (CYANOCOBALAMIN) 500 MCG tablet, Take 500 mcg by mouth daily., Disp: , Rfl:  .  desipramine (NORPRAMIN) 25 MG tablet, Take 1 tablet (25 mg  total) by mouth daily., Disp: 30 tablet, Rfl: 2   Family History  Problem Relation Age of Onset  . Diabetes Mother   . Hypertension Mother   . Heart disease Father   . Heart attack Father   . Heart disease Maternal Grandfather   . Heart disease Paternal Grandfather   . Hypertension Sister   . Lung cancer Maternal Grandmother   . Lung cancer Paternal Grandmother   . ADD / ADHD Son   . Cancer Neg Hx      Social History   Tobacco Use  . Smoking status: Current Every Day Smoker    Packs/day: 1.00    Years: 10.00    Pack years: 10.00    Types: Cigarettes  . Smokeless tobacco: Never Used  Vaping Use  . Vaping Use: Never used  Substance Use Topics  . Alcohol use: Yes    Alcohol/week: 1.0 standard drink    Types: 1 Glasses of wine per week    Comment: rare  . Drug use: Yes    Frequency: 7.0 times per week    Types: Marijuana    Comment: once daily at night (pt admits is self-medication for depression/insomnia)    Allergies as of 10/04/2020 - Review Complete 10/04/2020  Allergen Reaction Noted  . Amoxicillin Nausea And Vomiting and Rash  02/08/2016    Review of Systems:    All systems reviewed and negative except where noted in HPI.   Physical Exam:  BP (!) 146/96 (BP Location: Left Arm, Patient Position: Sitting, Cuff Size: Normal)   Pulse 98   Temp 98.2 F (36.8 C) (Oral)   Ht 5\' 5"  (1.651 m)   Wt 253 lb (114.8 kg)   BMI 42.10 kg/m  No LMP recorded.  General:   Alert,  Well-developed, well-nourished, pleasant and cooperative in NAD Head:  Normocephalic and atraumatic. Eyes:  Sclera clear, no icterus.   Conjunctiva pink. Ears:  Normal auditory acuity. Nose:  No deformity, discharge, or lesions. Mouth:  No deformity or lesions,oropharynx pink & moist. Neck:  Supple; no masses or thyromegaly. Lungs:  Respirations even and unlabored.  Clear throughout to auscultation.   No wheezes, crackles, or rhonchi. No acute distress. Heart:  Regular rate and rhythm; no murmurs, clicks, rubs, or gallops. Abdomen:  Normal bowel sounds. Soft, obese, non-tender and nondistended, without masses, hepatosplenomegaly or hernias noted.  No guarding or rebound tenderness.   Rectal: Not performed Msk:  Symmetrical without gross deformities. Good, equal movement & strength bilaterally. Pulses:  Normal pulses noted. Extremities:  No clubbing or edema.  No cyanosis. Neurologic:  Alert and oriented x3;  grossly normal neurologically. Skin:  Intact without significant lesions or rashes. No jaundice. Psych:  Alert and cooperative. Normal mood and affect.  Imaging Studies: No abdominal imaging  Assessment and Plan:   ARYANI DAFFERN is a 35 y.o. female with obesity, anxiety and depression, several years history of abdominal cramps, nonbloody diarrhea, bloating without any constitutional symptoms.  Her symptoms are consistent with diarrhea predominant IBS.   IBS-diarrhea: Not well controlled Trial of rifaximin 550 mg 3 times daily for 2 weeks, led to severe postprandial diarrhea, did not help, took only for 1 week Trial of IBgard,  modest relief, samples provided Did not tolerate amitriptyline due to daytime drowsiness Trial of low-dose desipramine H. pylori IgG, H. pylori breath test and screen for celiac disease -unremarkable Discussed about increase physical activity, diet and exercise to lose weight Affirmed her that since her diagnosis is  irritable bowel syndrome, I can only renew her ADA forms for 3 more months.  Patient brought her old forms with a change of date, I have asked her to bring new forms Trial of Viberzi 75 mg twice daily, samples provided Check fecal calprotectin levels, food allergy profile, alpha gal panel   Follow up in 3 months   Cephas Darby, MD

## 2020-10-04 NOTE — Patient Instructions (Signed)
Gave Viberzi 75mg  samples. Let us know how they work and we can call in a prescription for you

## 2020-10-06 DIAGNOSIS — F331 Major depressive disorder, recurrent, moderate: Secondary | ICD-10-CM | POA: Diagnosis not present

## 2020-10-07 DIAGNOSIS — F331 Major depressive disorder, recurrent, moderate: Secondary | ICD-10-CM | POA: Diagnosis not present

## 2020-10-20 DIAGNOSIS — F331 Major depressive disorder, recurrent, moderate: Secondary | ICD-10-CM | POA: Diagnosis not present

## 2020-10-30 ENCOUNTER — Encounter: Payer: Self-pay | Admitting: Gastroenterology

## 2020-10-31 MED ORDER — VIBERZI 75 MG PO TABS: 75.0000 mg | ORAL_TABLET | Freq: Two times a day (BID) | ORAL | 2 refills | Status: DC

## 2020-10-31 NOTE — Telephone Encounter (Signed)
Faxed to pharmacy

## 2020-10-31 NOTE — Telephone Encounter (Signed)
Trial of Viberzi 75 mg twice daily

## 2020-11-26 DIAGNOSIS — J029 Acute pharyngitis, unspecified: Secondary | ICD-10-CM | POA: Diagnosis not present

## 2020-12-07 ENCOUNTER — Encounter: Payer: Self-pay | Admitting: Gastroenterology

## 2020-12-08 DIAGNOSIS — F331 Major depressive disorder, recurrent, moderate: Secondary | ICD-10-CM | POA: Diagnosis not present

## 2020-12-08 NOTE — Telephone Encounter (Signed)
Called and left a message for call back  

## 2020-12-15 ENCOUNTER — Other Ambulatory Visit: Payer: Self-pay

## 2020-12-15 DIAGNOSIS — K529 Noninfective gastroenteritis and colitis, unspecified: Secondary | ICD-10-CM

## 2020-12-15 MED ORDER — GOLYTELY 236 G PO SOLR: 4000.0000 mL | Freq: Once | ORAL | 0 refills | Status: AC

## 2020-12-15 NOTE — Progress Notes (Signed)
Called to scheduled procedures

## 2020-12-21 ENCOUNTER — Encounter: Payer: Self-pay | Admitting: Gastroenterology

## 2020-12-28 NOTE — Discharge Instructions (Signed)

## 2020-12-29 ENCOUNTER — Ambulatory Visit
Admission: RE | Admit: 2020-12-29 | Discharge: 2020-12-29 | Disposition: A | Discharge status: Home / Self Care | Payer: BC Managed Care – PPO | Source: Ambulatory Visit | Attending: Gastroenterology | Admitting: Gastroenterology

## 2020-12-29 ENCOUNTER — Ambulatory Visit: Payer: BC Managed Care – PPO | Admitting: Anesthesiology

## 2020-12-29 ENCOUNTER — Encounter
Admission: RE | Disposition: A | Discharge status: Home / Self Care | Payer: Self-pay | Source: Ambulatory Visit | Attending: Gastroenterology

## 2020-12-29 ENCOUNTER — Other Ambulatory Visit: Payer: Self-pay

## 2020-12-29 DIAGNOSIS — R197 Diarrhea, unspecified: Secondary | ICD-10-CM

## 2020-12-29 DIAGNOSIS — Z880 Allergy status to penicillin: Secondary | ICD-10-CM | POA: Insufficient documentation

## 2020-12-29 DIAGNOSIS — Z801 Family history of malignant neoplasm of trachea, bronchus and lung: Secondary | ICD-10-CM | POA: Diagnosis not present

## 2020-12-29 DIAGNOSIS — F17210 Nicotine dependence, cigarettes, uncomplicated: Secondary | ICD-10-CM | POA: Insufficient documentation

## 2020-12-29 DIAGNOSIS — Z7951 Long term (current) use of inhaled steroids: Secondary | ICD-10-CM | POA: Diagnosis not present

## 2020-12-29 DIAGNOSIS — Z79899 Other long term (current) drug therapy: Secondary | ICD-10-CM | POA: Insufficient documentation

## 2020-12-29 DIAGNOSIS — Z8249 Family history of ischemic heart disease and other diseases of the circulatory system: Secondary | ICD-10-CM | POA: Diagnosis not present

## 2020-12-29 DIAGNOSIS — D122 Benign neoplasm of ascending colon: Secondary | ICD-10-CM | POA: Diagnosis not present

## 2020-12-29 DIAGNOSIS — F331 Major depressive disorder, recurrent, moderate: Secondary | ICD-10-CM | POA: Diagnosis not present

## 2020-12-29 DIAGNOSIS — K219 Gastro-esophageal reflux disease without esophagitis: Secondary | ICD-10-CM | POA: Diagnosis not present

## 2020-12-29 DIAGNOSIS — Z833 Family history of diabetes mellitus: Secondary | ICD-10-CM | POA: Insufficient documentation

## 2020-12-29 DIAGNOSIS — K635 Polyp of colon: Secondary | ICD-10-CM

## 2020-12-29 DIAGNOSIS — I10 Essential (primary) hypertension: Secondary | ICD-10-CM | POA: Diagnosis not present

## 2020-12-29 DIAGNOSIS — K529 Noninfective gastroenteritis and colitis, unspecified: Secondary | ICD-10-CM | POA: Insufficient documentation

## 2020-12-29 DIAGNOSIS — K580 Irritable bowel syndrome with diarrhea: Secondary | ICD-10-CM | POA: Diagnosis not present

## 2020-12-29 HISTORY — DX: Cardiac murmur, unspecified: R01.1

## 2020-12-29 HISTORY — DX: Unspecified osteoarthritis, unspecified site: M19.90

## 2020-12-29 HISTORY — DX: Unspecified asthma, uncomplicated: J45.909

## 2020-12-29 HISTORY — PX: ESOPHAGOGASTRODUODENOSCOPY (EGD) WITH PROPOFOL: SHX5813

## 2020-12-29 HISTORY — DX: Allergy, unspecified, initial encounter: T78.40XA

## 2020-12-29 HISTORY — PX: COLONOSCOPY WITH PROPOFOL: SHX5780

## 2020-12-29 HISTORY — PX: POLYPECTOMY: SHX5525

## 2020-12-29 LAB — POCT PREGNANCY, URINE: Preg Test, Ur: NEGATIVE

## 2020-12-29 SURGERY — COLONOSCOPY WITH PROPOFOL
Anesthesia: General | Site: Rectum

## 2020-12-29 MED ORDER — GLYCOPYRROLATE 0.2 MG/ML IJ SOLN
INTRAMUSCULAR | Status: DC | PRN
  Administered 2020-12-29: .1 mg via INTRAVENOUS

## 2020-12-29 MED ORDER — LIDOCAINE HCL (CARDIAC) PF 100 MG/5ML IV SOSY
PREFILLED_SYRINGE | INTRAVENOUS | Status: DC | PRN
  Administered 2020-12-29: 40 mg via INTRAVENOUS

## 2020-12-29 MED ORDER — PROPOFOL 500 MG/50ML IV EMUL
INTRAVENOUS | Status: DC | PRN
  Administered 2020-12-29: 160 ug/kg/min via INTRAVENOUS

## 2020-12-29 MED ORDER — SODIUM CHLORIDE 0.9 % IV SOLN
INTRAVENOUS | Status: DC
Start: 2020-12-29 — End: 2020-12-29

## 2020-12-29 MED ORDER — STERILE WATER FOR IRRIGATION IR SOLN: Status: DC | PRN

## 2020-12-29 MED ORDER — PROPOFOL 10 MG/ML IV BOLUS
INTRAVENOUS | Status: DC | PRN
  Administered 2020-12-29: 70 mg via INTRAVENOUS
  Administered 2020-12-29: 20 mg via INTRAVENOUS

## 2020-12-29 MED ORDER — LACTATED RINGERS IV SOLN: INTRAVENOUS | Status: DC

## 2020-12-29 SURGICAL SUPPLY — 13 items
BLOCK BITE 60FR ADLT L/F GRN (MISCELLANEOUS) ×3 IMPLANT
ELECT REM PT RETURN 9FT ADLT (ELECTROSURGICAL) ×3
ELECTRODE REM PT RTRN 9FT ADLT (ELECTROSURGICAL) ×2 IMPLANT
FORCEPS BIOP RAD 4 LRG CAP 4 (CUTTING FORCEPS) ×3 IMPLANT
GOWN CVR UNV OPN BCK APRN NK (MISCELLANEOUS) ×4 IMPLANT
GOWN ISOL THUMB LOOP REG UNIV (MISCELLANEOUS) ×2
KIT PRC NS LF DISP ENDO (KITS) ×2 IMPLANT
KIT PROCEDURE OLYMPUS (KITS) ×1
MANIFOLD NEPTUNE II (INSTRUMENTS) ×3 IMPLANT
SNARE COLD EXACTO (MISCELLANEOUS) ×3 IMPLANT
SNARE LASSO HEX 3 IN 1 (INSTRUMENTS) ×3 IMPLANT
TRAP ETRAP POLY (MISCELLANEOUS) ×3 IMPLANT
WATER STERILE IRR 250ML POUR (IV SOLUTION) ×3 IMPLANT

## 2020-12-29 NOTE — Transfer of Care (Signed)
Immediate Anesthesia Transfer of Care Note  Patient: Brandi [name]  Procedure(s) Performed: COLONOSCOPY WITH BIOPSY (N/A Rectum) ESOPHAGOGASTRODUODENOSCOPY (EGD) WITH BIOPSY (N/A Mouth) POLYPECTOMY (N/A Rectum)  Patient Location: PACU  Anesthesia Type: General  Level of Consciousness: awake, alert  and patient cooperative  Airway and Oxygen Therapy: Patient Spontanous Breathing and Patient connected to supplemental oxygen  Post-op Assessment: Post-op Vital signs reviewed, Patient's Cardiovascular Status Stable, Respiratory Function Stable, Patent Airway and No signs of Nausea or vomiting  Post-op Vital Signs: Reviewed and stable  Complications: No complications documented.

## 2020-12-29 NOTE — Anesthesia Preprocedure Evaluation (Signed)
Anesthesia Evaluation  Patient identified by MRN, date of birth, ID band Patient awake    Reviewed: Allergy & Precautions, NPO status , Patient's Chart, lab work & pertinent test results  Airway Mallampati: II  TM Distance: >3 FB Neck ROM: Full    Dental   Pulmonary asthma , Current Smoker,    Pulmonary exam normal        Cardiovascular hypertension, Normal cardiovascular exam     Neuro/Psych  Headaches, PSYCHIATRIC DISORDERS Depression    GI/Hepatic GERD  ,  Endo/Other    Renal/GU      Musculoskeletal  (+) Arthritis ,   Abdominal   Peds  Hematology   Anesthesia Other Findings   Reproductive/Obstetrics                             Anesthesia Physical Anesthesia Plan  ASA: II  Anesthesia Plan: General   Post-op Pain Management:    Induction:   PONV Risk Score and Plan: 2 and Ondansetron  Airway Management Planned: Natural Airway  Additional Equipment:   Intra-op Plan:   Post-operative Plan:   Informed Consent: I have reviewed the patients History and Physical, chart, labs and discussed the procedure including the risks, benefits and alternatives for the proposed anesthesia with the patient or authorized representative who has indicated his/her understanding and acceptance.       Plan Discussed with: CRNA, Anesthesiologist and Surgeon  Anesthesia Plan Comments:         Anesthesia Quick Evaluation

## 2020-12-29 NOTE — Op Note (Signed)
[facility] Patient Name: Brandi [name] Procedure Date: 12/29/2020 8:13 AM MRN: [id number] Account #: [id number] Date of Birth: 1986-03-28 Admit Type: Outpatient Age: 35 Room: [facility] OR ROOM 01 Gender: Female Note Status: Finalized Procedure:             Colonoscopy Indications:           This is the patient's first colonoscopy, Chronic                         diarrhea Providers:             Lin Landsman MD, MD Medicines:             General Anesthesia Complications:         No immediate complications. Estimated blood loss: None. Procedure:             Pre-Anesthesia Assessment:                        - Prior to the procedure, a History and Physical was                         performed, and patient medications and allergies were                         reviewed. The patient is competent. The risks and                         benefits of the procedure and the sedation options and                         risks were discussed with the patient. All questions                         were answered and informed consent was obtained.                         Patient identification and proposed procedure were                         verified by the physician, the nurse, the                         anesthesiologist, the anesthetist and the technician                         in the pre-procedure area in the procedure room in the                         endoscopy suite. Mental Status Examination: alert and                         oriented. Airway Examination: normal oropharyngeal                         airway and neck mobility. Respiratory Examination:                         clear to auscultation. CV Examination: normal.  Prophylactic Antibiotics: The patient does not require                         prophylactic antibiotics. Prior Anticoagulants: The                         patient has taken no previous anticoagulant or                          antiplatelet agents. ASA Grade Assessment: II - A                         patient with mild systemic disease. After reviewing                         the risks and benefits, the patient was deemed in                         satisfactory condition to undergo the procedure. The                         anesthesia plan was to use general anesthesia.                         Immediately prior to administration of medications,                         the patient was re-assessed for adequacy to receive                         sedatives. The heart rate, respiratory rate, oxygen                         saturations, blood pressure, adequacy of pulmonary                         ventilation, and response to care were monitored                         throughout the procedure. The physical status of the                         patient was re-assessed after the procedure.                        After obtaining informed consent, the colonoscope was                         passed under direct vision. Throughout the procedure,                         the patient's blood pressure, pulse, and oxygen                         saturations were monitored continuously. The was                         introduced through the anus and advanced to the the  terminal ileum, with identification of the appendiceal                         orifice and IC valve. The colonoscopy was performed                         without difficulty. The patient tolerated the                         procedure well. The quality of the bowel preparation                         was evaluated using the BBPS [facility] Bowel Preparation                         Scale) with scores of: Right Colon = 3, Transverse                         Colon = 3 and Left Colon = 3 (entire mucosa seen well                         with no residual staining, small fragments of stool or                         opaque liquid). The total  BBPS score equals 9. Findings:      The perianal and digital rectal examinations were normal. Pertinent       negatives include normal sphincter tone and no palpable rectal lesions.      The terminal ileum appeared normal. Biopsies were taken with a cold       forceps for histology.      A 9 mm polyp was found in the ascending colon. The polyp was sessile.       The polyp was removed with a hot snare. Resection and retrieval were       complete.      A 5 mm polyp was found in the ascending colon. The polyp was sessile.       The polyp was removed with a cold snare. Resection and retrieval were       complete.      Normal mucosa was found in the entire colon. Biopsies were taken with a       cold forceps for histology.      The retroflexed view of the distal rectum and anal verge was normal and       showed no anal or rectal abnormalities. Impression:            - The examined portion of the ileum was normal.                         Biopsied.                        - One 9 mm polyp in the ascending colon, removed with                         a hot snare. Resected and retrieved.                        -  One 5 mm polyp in the ascending colon, removed with                         a cold snare. Resected and retrieved.                        - Normal mucosa in the entire examined colon. Biopsied.                        - The distal rectum and anal verge are normal on                         retroflexion view. Recommendation:        - Discharge patient to home (with escort).                        - Resume previous diet today.                        - Continue present medications.                        - Await pathology results.                        - Repeat colonoscopy in 5 years for surveillance.                        - Return to my office as previously scheduled. Procedure Code(s):     --- Professional ---                        [id number], Colonoscopy, flexible; with removal of                          tumor(s), polyp(s), or other lesion(s) by snare                         technique                        45380, 67, Colonoscopy, flexible; with biopsy, single                         or multiple Diagnosis Code(s):     --- Professional ---                        K63.5, Polyp of colon                        K52.9, Noninfective gastroenteritis and colitis,                         unspecified CPT copyright 2019 American Medical Association. All rights reserved. The codes documented in this report are preliminary and upon coder review may  be revised to meet current compliance requirements. Dr. Ulyess Mort Lin Landsman MD, MD 12/29/2020 9:04:30 AM This report has been signed electronically. Number of Addenda: 0 Note Initiated On: 12/29/2020 8:13 AM Scope Withdrawal Time: 0 hours 21 minutes 14 seconds  Total Procedure Duration:  0 hours 23 minutes 40 seconds  Estimated Blood Loss:  Estimated blood loss: none.      [facility]

## 2020-12-29 NOTE — Anesthesia Procedure Notes (Signed)
Date/Time: 12/29/2020 8:22 AM Performed by: Dionne Bucy, CRNA Pre-anesthesia Checklist: Patient identified, Emergency Drugs available, Suction available, Patient being monitored and Timeout performed Patient Re-evaluated:Patient Re-evaluated prior to induction Oxygen Delivery Method: Nasal cannula Induction Type: IV induction Placement Confirmation: positive ETCO2

## 2020-12-29 NOTE — Anesthesia Postprocedure Evaluation (Signed)
Anesthesia Post Note  Patient: Brandi [name]  Procedure(s) Performed: COLONOSCOPY WITH BIOPSY (N/A Rectum) ESOPHAGOGASTRODUODENOSCOPY (EGD) WITH BIOPSY (N/A Mouth) POLYPECTOMY (N/A Rectum)     Patient location during evaluation: PACU Anesthesia Type: General Level of consciousness: awake and alert Pain management: pain level controlled Vital Signs Assessment: post-procedure vital signs reviewed and stable Respiratory status: spontaneous breathing, nonlabored ventilation and respiratory function stable Cardiovascular status: blood pressure returned to baseline and stable Postop Assessment: no apparent nausea or vomiting Anesthetic complications: no   No complications documented.  Wanda Plump Jakin Pavao

## 2020-12-29 NOTE — Op Note (Signed)
[facility] Patient Name: Brandi [name] Procedure Date: 12/29/2020 8:14 AM MRN: [id number] Account #: [id number] Date of Birth: 21-Jul-1986 Admit Type: Outpatient Age: 35 Room: [facility] OR ROOM 01 Gender: Female Note Status: Finalized Procedure:             Upper GI endoscopy Indications:           Diarrhea Providers:             Lin Landsman MD, MD Referring MD:          Lupita Raider. Malfi (Referring MD) Medicines:             General Anesthesia Complications:         No immediate complications. Estimated blood loss: None. Procedure:             Pre-Anesthesia Assessment:                        - Prior to the procedure, a History and Physical was                         performed, and patient medications and allergies were                         reviewed. The patient is competent. The risks and                         benefits of the procedure and the sedation options and                         risks were discussed with the patient. All questions                         were answered and informed consent was obtained.                         Patient identification and proposed procedure were                         verified by the physician, the nurse, the                         anesthesiologist, the anesthetist and the technician                         in the pre-procedure area in the procedure room in the                         endoscopy suite. Mental Status Examination: alert and                         oriented. Airway Examination: normal oropharyngeal                         airway and neck mobility. Respiratory Examination:                         clear to auscultation. CV Examination: normal.  Prophylactic Antibiotics: The patient does not require                         prophylactic antibiotics. Prior Anticoagulants: The                         patient has taken no previous anticoagulant or                          antiplatelet agents. ASA Grade Assessment: II - A                         patient with mild systemic disease. After reviewing                         the risks and benefits, the patient was deemed in                         satisfactory condition to undergo the procedure. The                         anesthesia plan was to use general anesthesia.                         Immediately prior to administration of medications,                         the patient was re-assessed for adequacy to receive                         sedatives. The heart rate, respiratory rate, oxygen                         saturations, blood pressure, adequacy of pulmonary                         ventilation, and response to care were monitored                         throughout the procedure. The physical status of the                         patient was re-assessed after the procedure.                        After obtaining informed consent, the endoscope was                         passed under direct vision. Throughout the procedure,                         the patient's blood pressure, pulse, and oxygen                         saturations were monitored continuously. The was                         introduced through the mouth, and advanced to the  second part of duodenum. The upper GI endoscopy was                         accomplished without difficulty. The patient tolerated                         the procedure well. Findings:      The examined duodenum was normal. Biopsies were taken with a cold       forceps for histology.      The entire examined stomach was normal. Biopsies were taken with a cold       forceps for Helicobacter pylori testing.      The gastroesophageal junction and examined esophagus were normal.      The cardia and gastric fundus were normal on retroflexion. Impression:            - Normal examined duodenum. Biopsied.                        - Normal stomach.  Biopsied.                        - Normal gastroesophageal junction and esophagus. Recommendation:        - Await pathology results.                        - Proceed with colonoscopy as scheduled                        See colonoscopy report Procedure Code(s):     --- Professional ---                        [phone], Esophagogastroduodenoscopy, flexible,                         transoral; with biopsy, single or multiple Diagnosis Code(s):     --- Professional ---                        R19.7, Diarrhea, unspecified CPT copyright 2019 American Medical Association. All rights reserved. The codes documented in this report are preliminary and upon coder review may  be revised to meet current compliance requirements. Dr. Ulyess Mort Lin Landsman MD, MD 12/29/2020 8:37:38 AM This report has been signed electronically. Number of Addenda: 0 Note Initiated On: 12/29/2020 8:14 AM Total Procedure Duration: 0 hours 6 minutes 6 seconds  Estimated Blood Loss:  Estimated blood loss: none. Estimated blood loss: none.      [facility]

## 2020-12-29 NOTE — H&P (Signed)
Brandi Darby, MD [address]  [place]  [place], [place] [address]  Main: [phone]  Fax: [phone] Pager: [phone]  Primary Care Physician:  Verl Bangs, FNP Primary Gastroenterologist:  Dr. Cephas [name]  Pre-Procedure History & Physical: HPI:  Brandi [name] is a 35 y.o. female is here for an endoscopy and colonoscopy.   Past Medical History:  Diagnosis Date  . Abnormal Pap smear of cervix 12/2015   ascus/pos- achd  . Allergies   . Arthritis   . Asthma   . Chlamydial cervicitis   . Depression    pt has past treatment w/ Zoloft - good symptom control, but worsening IBS symptoms  . Genital HSV   . GERD (gastroesophageal reflux disease)   . Heart murmur   . Hypertension   . IBS (irritable bowel syndrome)   . Trichomoniasis     Past Surgical History:  Procedure Laterality Date  . NO PAST SURGERIES      Prior to Admission medications   Medication Sig Start Date End Date Taking? Authorizing Provider  Albuterol Sulfate 108 (90 Base) MCG/ACT AEPB Inhale into the lungs. 10/24/18 12/29/20 Yes [provider]  cyclobenzaprine (FLEXERIL) 10 MG tablet TAKE 1 TABLET BY MOUTH THREE TIMES A DAY AS NEEDED FOR MUSCLE SPASMS 08/04/20  Yes Malfi, Lupita Raider, FNP  desipramine (NORPRAMIN) 25 MG tablet Take 1 tablet (25 mg total) by mouth daily. 07/04/20  Yes Jae Skeet, Tally Due, MD  dicyclomine (BENTYL) 10 MG capsule Take 1 capsule (10 mg total) by mouth 4 (four) times daily -  before meals and at bedtime. Patient taking differently: Take 10 mg by mouth as needed. 11/14/18  Yes Janesia Joswick, Tally Due, MD  fluticasone (FLONASE) 50 MCG/ACT nasal spray Place 1 spray into both nostrils 2 (two) times daily. 12/22/19  Yes [provider]  lisinopril-hydrochlorothiazide (ZESTORETIC) 10-12.5 MG tablet TAKE 1 TABLET BY MOUTH EVERY DAY 09/25/20  Yes Malfi, Lupita Raider, FNP  loratadine (CLARITIN) 10 MG tablet Take 10 mg by mouth daily as needed for allergies.   Yes  [provider]  Multiple Vitamins-Minerals (WOMENS DAILY FORMULA PO) Take by mouth.   Yes [provider]  omeprazole (PRILOSEC) 20 MG capsule Take 1 capsule (20 mg total) by mouth daily. 08/18/20  Yes Malfi, Lupita Raider, FNP  rizatriptan (MAXALT) 10 MG tablet TAKE 1 TABLET BY MOUTH AS NEEDED FOR MIGRAINE. MAY REPEAT IN 2 HOURS IF NEEDED 06/22/20  Yes Malfi, Lupita Raider, FNP  Eluxadoline (VIBERZI) 75 MG TABS Take 75 mg by mouth in the morning and at bedtime. Patient not taking: Reported on 12/21/2020 10/31/20   Lin Landsman, MD  hydrocortisone (ANUSOL-HC) 2.5 % rectal cream Place 1 application rectally 2 (two) times daily as needed for hemorrhoids or anal itching. 10/22/18   Mikey College, NP  vitamin B-12 (CYANOCOBALAMIN) 500 MCG tablet Take 500 mcg by mouth daily. Patient not taking: Reported on 12/21/2020    [provider]    Allergies as of 12/15/2020 - Review Complete 10/04/2020  Allergen Reaction Noted  . Amoxicillin Nausea And Vomiting and Rash 02/08/2016    Family History  Problem Relation Age of Onset  . Diabetes Mother   . Hypertension Mother   . Heart disease Father   . Heart attack Father   . Heart disease Maternal Grandfather   . Heart disease Paternal Grandfather   . Hypertension Sister   . Lung cancer Maternal Grandmother   . Lung cancer Paternal Grandmother   .  ADD / ADHD Son   . Cancer Neg Hx     Social History   Socioeconomic History  . Marital status: Single    Spouse name: Not on file  . Number of children: 1  . Years of education: Not on file  . Highest education level: Some college, no degree  Occupational History  . Not on file  Tobacco Use  . Smoking status: Current Every Day Smoker    Packs/day: 1.00    Years: 12.00    Pack years: 12.00    Types: Cigarettes  . Smokeless tobacco: Never Used  Vaping Use  . Vaping Use: Never used  Substance and Sexual Activity  . Alcohol use: Yes    Alcohol/week: 1.0 standard  drink    Types: 1 Glasses of wine per week    Comment: rare  . Drug use: Yes    Frequency: 7.0 times per week    Types: Marijuana    Comment: once daily at night (pt admits is self-medication for depression/insomnia)  . Sexual activity: Yes    Birth control/protection: Injection  Other Topics Concern  . Not on file  Social History Narrative  . Not on file   Social Determinants of Health   Financial Resource Strain: Not on file  Food Insecurity: Not on file  Transportation Needs: Not on file  Physical Activity: Not on file  Stress: Not on file  Social Connections: Not on file  Intimate Partner Violence: Not on file    Review of Systems: See HPI, otherwise negative ROS  Physical Exam: BP (!) 141/99   Pulse 100   Temp 97.7 F (36.5 C)   Ht 5\' 6"  (1.676 m)   Wt 110.7 kg   LMP 12/15/2020 (Approximate)   SpO2 99%   BMI 39.38 kg/m  General:   Alert,  pleasant and cooperative in NAD Head:  Normocephalic and atraumatic. Neck:  Supple; no masses or thyromegaly. Lungs:  Clear throughout to auscultation.    Heart:  Regular rate and rhythm. Abdomen:  Soft, nontender and nondistended. Normal bowel sounds, without guarding, and without rebound.   Neurologic:  Alert and  oriented x4;  grossly normal neurologically.  Impression/Plan: Brandi [name] is here for an endoscopy and colonoscopy to be performed for chronic diarrhea  Risks, benefits, limitations, and alternatives regarding  endoscopy and colonoscopy have been reviewed with the patient.  Questions have been answered.  All parties agreeable.   Sherri Sear, MD  12/29/2020, 8:10 AM

## 2020-12-30 ENCOUNTER — Encounter: Payer: Self-pay | Admitting: Gastroenterology

## 2021-01-02 ENCOUNTER — Encounter: Payer: Self-pay | Admitting: Gastroenterology

## 2021-01-02 LAB — SURGICAL PATHOLOGY

## 2021-01-03 ENCOUNTER — Encounter: Payer: Self-pay | Admitting: Gastroenterology

## 2021-01-03 ENCOUNTER — Ambulatory Visit: Payer: BC Managed Care – PPO | Admitting: Gastroenterology

## 2021-01-06 ENCOUNTER — Encounter: Payer: Self-pay | Admitting: Gastroenterology

## 2021-01-09 NOTE — Telephone Encounter (Signed)
Called in to follow-up on your my chart message.Please call to advise patient. Needs forms filled out for ADA and missed last week appt with the doctor.

## 2021-01-10 ENCOUNTER — Other Ambulatory Visit: Payer: Self-pay | Admitting: Internal Medicine

## 2021-01-10 DIAGNOSIS — I10 Essential (primary) hypertension: Secondary | ICD-10-CM

## 2021-01-10 MED ORDER — LISINOPRIL-HYDROCHLOROTHIAZIDE 10-12.5 MG PO TABS: 1.0000 | ORAL_TABLET | Freq: Every day | ORAL | 0 refills | Status: DC

## 2021-01-10 NOTE — Telephone Encounter (Signed)
Copied from [place] [id number]. Topic: Quick Communication - Rx Refill/Question >> Jan 10, 2021  9:09 AM Leward Quan A wrote: Medication: lisinopril-hydrochlorothiazide (ZESTORETIC) 10-12.5 MG tablet  Has been without for over a week   Has the patient contacted their pharmacy? Yes.   (Agent: If no, request that the patient contact the pharmacy for the refill.) (Agent: If yes, when and what did the pharmacy advise?)  Preferred Pharmacy (with phone number or street name): [id number] - [place], [place]  Phone:  [phone] Fax:  [phone]     Agent: Please be advised that RX refills may take up to 3 business days. We ask that you follow-up with your pharmacy.

## 2021-01-10 NOTE — Telephone Encounter (Signed)
  Notes to clinic:Patient has appt on 01/13/2021 Would like to have refilled today  Patient has been out of medication for over a week   Requested Prescriptions  Pending Prescriptions Disp Refills   lisinopril-hydrochlorothiazide (ZESTORETIC) 10-12.5 MG tablet 90 tablet 0    Sig: Take 1 tablet by mouth daily.      Cardiovascular:  ACEI + Diuretic Combos Failed - 01/10/2021  9:13 AM      Failed - Na in normal range and within 180 days    Sodium  Date Value Ref Range Status  05/30/2020 139 135 - 146 mmol/L Final          Failed - K in normal range and within 180 days    Potassium  Date Value Ref Range Status  05/30/2020 4.2 3.5 - 5.3 mmol/L Final          Failed - Cr in normal range and within 180 days    Creat  Date Value Ref Range Status  05/30/2020 0.71 0.50 - 1.10 mg/dL Final          Failed - Ca in normal range and within 180 days    Calcium  Date Value Ref Range Status  05/30/2020 10.0 8.6 - 10.2 mg/dL Final          Failed - Valid encounter within last 6 months    Recent Outpatient Visits           7 months ago Chronic bilateral low back pain without sciatica   [facility], Lupita Raider, FNP   1 year ago Scoliosis, unspecified scoliosis type, unspecified spinal region   [facility] Merrilyn Puma, Jerrel Ivory, NP   1 year ago Encounter for surveillance of injectable contraceptive   [facility] Mikey College, NP   2 years ago Acute cystitis without hematuria   [facility] Mikey College, NP   2 years ago Irritable bowel syndrome with diarrhea   [facility] Merrilyn Puma, Jerrel Ivory, NP       Future Appointments             In 3 days Garnette Gunner, Coralie Keens, NP [facility] [place] - Patient is not pregnant      Passed - Last BP in normal range    BP Readings from Last 1 Encounters:  12/29/20 108/73

## 2021-01-11 DIAGNOSIS — F331 Major depressive disorder, recurrent, moderate: Secondary | ICD-10-CM | POA: Diagnosis not present

## 2021-01-13 ENCOUNTER — Encounter: Payer: Self-pay | Admitting: Internal Medicine

## 2021-01-13 ENCOUNTER — Ambulatory Visit: Payer: BC Managed Care – PPO | Admitting: Internal Medicine

## 2021-01-13 ENCOUNTER — Other Ambulatory Visit: Payer: Self-pay

## 2021-01-13 VITALS — BP 100/78 | HR 65 | Resp 16 | Ht 66.0 in | Wt 248.4 lb

## 2021-01-13 DIAGNOSIS — Z0289 Encounter for other administrative examinations: Secondary | ICD-10-CM

## 2021-01-13 DIAGNOSIS — M25562 Pain in left knee: Secondary | ICD-10-CM

## 2021-01-13 DIAGNOSIS — M25561 Pain in right knee: Secondary | ICD-10-CM

## 2021-01-13 DIAGNOSIS — M545 Low back pain, unspecified: Secondary | ICD-10-CM

## 2021-01-13 DIAGNOSIS — G43809 Other migraine, not intractable, without status migrainosus: Secondary | ICD-10-CM | POA: Diagnosis not present

## 2021-01-13 DIAGNOSIS — I10 Essential (primary) hypertension: Secondary | ICD-10-CM | POA: Diagnosis not present

## 2021-01-13 DIAGNOSIS — F3341 Major depressive disorder, recurrent, in partial remission: Secondary | ICD-10-CM

## 2021-01-13 DIAGNOSIS — K219 Gastro-esophageal reflux disease without esophagitis: Secondary | ICD-10-CM

## 2021-01-13 DIAGNOSIS — K580 Irritable bowel syndrome with diarrhea: Secondary | ICD-10-CM | POA: Diagnosis not present

## 2021-01-13 DIAGNOSIS — G8929 Other chronic pain: Secondary | ICD-10-CM

## 2021-01-13 NOTE — Assessment & Plan Note (Signed)
Encouraged stress relieving measures  Continue Rizatriptan as needed

## 2021-01-13 NOTE — Patient Instructions (Signed)

## 2021-01-13 NOTE — Assessment & Plan Note (Signed)
Encourage weight loss Continue Flexeril as needed

## 2021-01-13 NOTE — Assessment & Plan Note (Signed)
Encouraged weight loss Continue Flexeril as needed

## 2021-01-13 NOTE — Assessment & Plan Note (Signed)
On the low side but she is not symptomatic Continue Lisinopril HCT Reinforced DASH diet and exercise for weight loss We will check c-Met at annual exam Will monitor

## 2021-01-13 NOTE — Assessment & Plan Note (Signed)
Continue Imodium OTC as needed Will fill out accommodation forms She will continue to follow with GI

## 2021-01-13 NOTE — Assessment & Plan Note (Signed)
Stable off meds She will continue seeing her therapist Support offered

## 2021-01-13 NOTE — Assessment & Plan Note (Signed)
Encouraged weight loss as this can help reduce reflux symptoms Continue Omeprazole OTC She will continue to follow with GI

## 2021-01-13 NOTE — Progress Notes (Signed)
Subjective:    Patient ID: Brandi [name], female    DOB: 06-12-1986, 35 y.o.   MRN: [id number]  HPI  Patient presents to clinic today for follow-up of chronic conditions.  She is establishing care with me today, transferring care from Brandi Peak, NP.  HTN: Her BP today is 110/78.  She is taking Lisinopril HCT as prescribed.  There is no ECG on file.  Migraines: These occur once every few months.  Triggered by work stress, lack of sleep.  She takes Rizatriptan as needed with good relief of symptoms. She does not follow with neurology.  IBS: Mainly diarrhea.  She is not taking Dicyclomine a prescribed but is taking Imodium.  Colonoscopy from 12/2020 reviewed.  She follows with GI. She would like an accomodation for work for bathroom breaks.  GERD: She is not sure what triggers this.  She denies breakthrough on Omeprazole every other day.  Upper GI from 12/2020 reviewed.  Depression: She is not taking Norpramine as prescribed.  She is currently following with a therapist.  She denies anxiety, SI/HI.  Chronic Pain Syndrome: Mainly in her back and knees.  She takes Flexeril as needed with good relief of symptoms.  She has seen an orthopedist in the past for the same.  Review of Systems      Past Medical History:  Diagnosis Date  . Abnormal Pap smear of cervix 12/2015   ascus/pos- achd  . Allergies   . Arthritis   . Asthma   . Chlamydial cervicitis   . Depression    pt has past treatment w/ Zoloft - good symptom control, but worsening IBS symptoms  . Genital HSV   . GERD (gastroesophageal reflux disease)   . Heart murmur   . Hypertension   . IBS (irritable bowel syndrome)   . Trichomoniasis     Current Outpatient Medications  Medication Sig Dispense Refill  . Albuterol Sulfate 108 (90 Base) MCG/ACT AEPB Inhale into the lungs.    . cyclobenzaprine (FLEXERIL) 10 MG tablet TAKE 1 TABLET BY MOUTH THREE TIMES A DAY AS NEEDED FOR MUSCLE SPASMS 90 tablet 1  . desipramine  (NORPRAMIN) 25 MG tablet Take 1 tablet (25 mg total) by mouth daily. 30 tablet 2  . dicyclomine (BENTYL) 10 MG capsule Take 1 capsule (10 mg total) by mouth 4 (four) times daily -  before meals and at bedtime. (Patient taking differently: Take 10 mg by mouth as needed.) 90 capsule 0  . fluticasone (FLONASE) 50 MCG/ACT nasal spray Place 1 spray into both nostrils 2 (two) times daily.    . hydrocortisone (ANUSOL-HC) 2.5 % rectal cream Place 1 application rectally 2 (two) times daily as needed for hemorrhoids or anal itching. 30 g 0  . lisinopril-hydrochlorothiazide (ZESTORETIC) 10-12.5 MG tablet Take 1 tablet by mouth daily. 90 tablet 0  . loratadine (CLARITIN) 10 MG tablet Take 10 mg by mouth daily as needed for allergies.    . Multiple Vitamins-Minerals (WOMENS DAILY FORMULA PO) Take by mouth.    Marland Kitchen omeprazole (PRILOSEC) 20 MG capsule Take 1 capsule (20 mg total) by mouth daily. 90 capsule 1  . rizatriptan (MAXALT) 10 MG tablet TAKE 1 TABLET BY MOUTH AS NEEDED FOR MIGRAINE. MAY REPEAT IN 2 HOURS IF NEEDED 10 tablet 0  . vitamin B-12 (CYANOCOBALAMIN) 500 MCG tablet Take 500 mcg by mouth daily. (Patient not taking: Reported on 12/21/2020)     No current facility-administered medications for this visit.    Allergies  Allergen  Reactions  . Viberzi [Eluxadoline] Itching  . Amoxicillin Nausea And Vomiting and Rash    Family History  Problem Relation Age of Onset  . Diabetes Mother   . Hypertension Mother   . Heart disease Father   . Heart attack Father   . Heart disease Maternal Grandfather   . Heart disease Paternal Grandfather   . Hypertension Sister   . Lung cancer Maternal Grandmother   . Lung cancer Paternal Grandmother   . ADD / ADHD Son   . Cancer Neg Hx     Social History   Socioeconomic History  . Marital status: Single    Spouse name: Not on file  . Number of children: 1  . Years of education: Not on file  . Highest education level: Some college, no degree  Occupational  History  . Not on file  Tobacco Use  . Smoking status: Current Every Day Smoker    Packs/day: 1.00    Years: 12.00    Pack years: 12.00    Types: Cigarettes  . Smokeless tobacco: Never Used  Vaping Use  . Vaping Use: Never used  Substance and Sexual Activity  . Alcohol use: Yes    Alcohol/week: 1.0 standard drink    Types: 1 Glasses of wine per week    Comment: rare  . Drug use: Yes    Frequency: 7.0 times per week    Types: Marijuana    Comment: once daily at night (pt admits is self-medication for depression/insomnia)  . Sexual activity: Yes    Birth control/protection: Injection  Other Topics Concern  . Not on file  Social History Narrative  . Not on file   Social Determinants of Health   Financial Resource Strain: Not on file  Food Insecurity: Not on file  Transportation Needs: Not on file  Physical Activity: Not on file  Stress: Not on file  Social Connections: Not on file  Intimate Partner Violence: Not on file     Constitutional: Patient reports intermittent headaches.  Denies fever, malaise, fatigue, or abrupt weight changes.  HEENT: Denies eye pain, eye redness, ear pain, ringing in the ears, wax buildup, runny nose, nasal congestion, bloody nose, or sore throat. Respiratory: Denies difficulty breathing, shortness of breath, cough or sputum production.   Cardiovascular: Denies chest pain, chest tightness, palpitations or swelling in the hands or feet.  Gastrointestinal: Patient reports intermittent abdominal cramping in diarrhea.  Denies abdominal pain, bloating, constipation, or blood in the stool.  GU: Denies urgency, frequency, pain with urination, burning sensation, blood in urine, odor or discharge. Musculoskeletal: Patient reports chronic back and knee pain.  Denies decrease in range of motion, difficulty with gait, or joint  swelling.  Skin: Denies redness, rashes, lesions or ulcercations.  Neurological: Denies dizziness, difficulty with memory,  difficulty with speech or problems with balance and coordination.  Psych: Patient has a history of depression.  Denies anxiety, SI/HI.  No other specific complaints in a complete review of systems (except as listed in HPI above).  Objective:   Physical Exam   BP 100/78 (BP Location: Left Arm, Patient Position: Sitting, Cuff Size: Normal)   Pulse 65   Resp 16   Ht 5\' 6"  (1.676 m)   Wt 248 lb 6.4 oz (112.7 kg)   LMP 12/15/2020 (Approximate)   BMI 40.09 kg/m   Wt Readings from Last 3 Encounters:  12/29/20 244 lb (110.7 kg)  10/04/20 253 lb (114.8 kg)  07/04/20 252 lb 6 oz (114.5 kg)  General: Appears her stated age, obese, in NAD. Skin: Warm, dry and intact. No rashes noted. HEENT: Head: normal shape and size; Eyes: sclera white and EOMs intact;  Cardiovascular: Normal rate and rhythm. S1,S2 noted.  No murmur, rubs or gallops noted.  Pulmonary/Chest: Normal effort and positive vesicular breath sounds. No respiratory distress. No wheezes, rales or ronchi noted.  Abdomen: Soft and nontender. Normal bowel sounds. No distention or masses noted. Liver, spleen and kidneys non palpable. Musculoskeletal: No difficulty with gait.  Neurological: Alert and oriented. Psychiatric: Mood and affect normal. Behavior is normal. Judgment and thought content normal.    BMET    Component Value Date/Time   NA 139 05/30/2020 1051   K 4.2 05/30/2020 1051   CL 105 05/30/2020 1051   CO2 21 05/30/2020 1051   GLUCOSE 97 05/30/2020 1051   BUN 7 05/30/2020 1051   CREATININE 0.71 05/30/2020 1051   CALCIUM 10.0 05/30/2020 1051   GFRNONAA 112 05/30/2020 1051   GFRAA 130 05/30/2020 1051    Lipid Panel     Component Value Date/Time   CHOL 197 05/30/2020 1051   TRIG 221 (H) 05/30/2020 1051   HDL 41 (L) 05/30/2020 1051   CHOLHDL 4.8 05/30/2020 1051   LDLCALC 123 (H) 05/30/2020 1051    CBC    Component Value Date/Time   WBC 10.0 05/30/2020 1051   RBC 5.34 (H) 05/30/2020 1051   HGB 16.3 (H)  05/30/2020 1051   HCT 49.1 (H) 05/30/2020 1051   PLT 409 (H) 05/30/2020 1051   MCV 91.9 05/30/2020 1051   MCH 30.5 05/30/2020 1051   MCHC 33.2 05/30/2020 1051   RDW 12.3 05/30/2020 1051   LYMPHSABS 2,970 05/30/2020 1051   EOSABS 210 05/30/2020 1051   BASOSABS 40 05/30/2020 1051    Hgb A1C No results found for: HGBA1C         Assessment & Plan:    Webb Silversmith, NP This visit occurred during the SARS-CoV-2 public health emergency.  Safety protocols were in place, including screening questions prior to the visit, additional usage of staff PPE, and extensive cleaning of exam room while observing appropriate contact time as indicated for disinfecting solutions.

## 2021-01-31 ENCOUNTER — Encounter: Payer: Self-pay | Admitting: Internal Medicine

## 2021-01-31 DIAGNOSIS — A6004 Herpesviral vulvovaginitis: Secondary | ICD-10-CM

## 2021-02-01 DIAGNOSIS — F331 Major depressive disorder, recurrent, moderate: Secondary | ICD-10-CM | POA: Diagnosis not present

## 2021-02-03 DIAGNOSIS — A6000 Herpesviral infection of urogenital system, unspecified: Secondary | ICD-10-CM | POA: Insufficient documentation

## 2021-02-03 HISTORY — DX: Herpesviral infection of urogenital system, unspecified: A60.00

## 2021-02-03 MED ORDER — VALACYCLOVIR HCL 500 MG PO TABS: ORAL_TABLET | ORAL | 0 refills | Status: DC

## 2021-02-11 ENCOUNTER — Other Ambulatory Visit: Payer: Self-pay | Admitting: Internal Medicine

## 2021-02-11 NOTE — Telephone Encounter (Signed)
Requested Prescriptions  Pending Prescriptions Disp Refills  . valACYclovir (VALTREX) 500 MG tablet [Pharmacy Med Name: VALACYCLOVIR HCL 500 MG TABLET] 30 tablet 0    Sig: 1 TABLET TWICE A DAY X 3 DAYS AS NEEDED FOR OUTBREAKS     Antimicrobials:  Antiviral Agents - Anti-Herpetic Passed - 02/11/2021  2:36 PM      Passed - Valid encounter within last 12 months    Recent Outpatient Visits          4 weeks ago Essential hypertension   [facility] [place] W, NP   8 months ago Chronic bilateral low back pain without sciatica   [facility], Lupita Raider, FNP   1 year ago Scoliosis, unspecified scoliosis type, unspecified spinal region   [facility] [place] Merrilyn Puma, Jerrel Ivory, NP   1 year ago Encounter for surveillance of injectable contraceptive   [facility] Mikey College, NP   2 years ago Acute cystitis without hematuria   [facility] Mikey College, NP      Future Appointments            In 5 months Baity, Coralie Keens, NP [facility], [facility]

## 2021-02-27 ENCOUNTER — Encounter: Payer: Self-pay | Admitting: Internal Medicine

## 2021-03-09 DIAGNOSIS — F331 Major depressive disorder, recurrent, moderate: Secondary | ICD-10-CM | POA: Diagnosis not present

## 2021-03-14 ENCOUNTER — Encounter: Payer: Self-pay | Admitting: Internal Medicine

## 2021-03-14 DIAGNOSIS — K219 Gastro-esophageal reflux disease without esophagitis: Secondary | ICD-10-CM

## 2021-03-14 MED ORDER — OMEPRAZOLE 20 MG PO CPDR: 20.0000 mg | DELAYED_RELEASE_CAPSULE | Freq: Every day | ORAL | 1 refills | Status: DC

## 2021-03-23 DIAGNOSIS — F331 Major depressive disorder, recurrent, moderate: Secondary | ICD-10-CM | POA: Diagnosis not present

## 2021-03-28 DIAGNOSIS — R0781 Pleurodynia: Secondary | ICD-10-CM | POA: Diagnosis not present

## 2021-03-28 DIAGNOSIS — I517 Cardiomegaly: Secondary | ICD-10-CM | POA: Diagnosis not present

## 2021-04-14 ENCOUNTER — Encounter: Payer: Self-pay | Admitting: Internal Medicine

## 2021-04-14 DIAGNOSIS — I10 Essential (primary) hypertension: Secondary | ICD-10-CM

## 2021-04-17 MED ORDER — LISINOPRIL-HYDROCHLOROTHIAZIDE 10-12.5 MG PO TABS: 1.0000 | ORAL_TABLET | Freq: Every day | ORAL | 1 refills | Status: DC

## 2021-05-10 DIAGNOSIS — F331 Major depressive disorder, recurrent, moderate: Secondary | ICD-10-CM | POA: Diagnosis not present

## 2021-06-02 DIAGNOSIS — F331 Major depressive disorder, recurrent, moderate: Secondary | ICD-10-CM | POA: Diagnosis not present

## 2021-06-15 DIAGNOSIS — F331 Major depressive disorder, recurrent, moderate: Secondary | ICD-10-CM | POA: Diagnosis not present

## 2021-07-08 ENCOUNTER — Other Ambulatory Visit: Payer: Self-pay | Admitting: Internal Medicine

## 2021-07-10 MED ORDER — VALACYCLOVIR HCL 500 MG PO TABS: ORAL_TABLET | ORAL | 0 refills | Status: DC

## 2021-07-18 ENCOUNTER — Encounter: Payer: Self-pay | Admitting: Internal Medicine

## 2021-07-18 DIAGNOSIS — I10 Essential (primary) hypertension: Secondary | ICD-10-CM

## 2021-07-20 ENCOUNTER — Encounter: Payer: Self-pay | Admitting: Internal Medicine

## 2021-07-20 ENCOUNTER — Other Ambulatory Visit: Payer: Self-pay

## 2021-07-20 ENCOUNTER — Ambulatory Visit (INDEPENDENT_AMBULATORY_CARE_PROVIDER_SITE_OTHER): Payer: BC Managed Care – PPO | Admitting: Internal Medicine

## 2021-07-20 VITALS — BP 118/81 | HR 95 | Temp 98.2°F | Resp 18 | Ht 66.0 in | Wt 240.0 lb

## 2021-07-20 DIAGNOSIS — E782 Mixed hyperlipidemia: Secondary | ICD-10-CM

## 2021-07-20 DIAGNOSIS — Z0001 Encounter for general adult medical examination with abnormal findings: Secondary | ICD-10-CM

## 2021-07-20 DIAGNOSIS — N761 Subacute and chronic vaginitis: Secondary | ICD-10-CM

## 2021-07-20 DIAGNOSIS — D75839 Thrombocytosis, unspecified: Secondary | ICD-10-CM

## 2021-07-20 DIAGNOSIS — Z6838 Body mass index (BMI) 38.0-38.9, adult: Secondary | ICD-10-CM

## 2021-07-20 DIAGNOSIS — K580 Irritable bowel syndrome with diarrhea: Secondary | ICD-10-CM

## 2021-07-20 DIAGNOSIS — D751 Secondary polycythemia: Secondary | ICD-10-CM | POA: Diagnosis not present

## 2021-07-20 DIAGNOSIS — R7309 Other abnormal glucose: Secondary | ICD-10-CM | POA: Diagnosis not present

## 2021-07-20 DIAGNOSIS — O99210 Obesity complicating pregnancy, unspecified trimester: Secondary | ICD-10-CM | POA: Insufficient documentation

## 2021-07-20 DIAGNOSIS — Z114 Encounter for screening for human immunodeficiency virus [HIV]: Secondary | ICD-10-CM

## 2021-07-20 DIAGNOSIS — Z1159 Encounter for screening for other viral diseases: Secondary | ICD-10-CM

## 2021-07-20 HISTORY — DX: Thrombocytosis, unspecified: D75.839

## 2021-07-20 NOTE — Assessment & Plan Note (Signed)
CBC today.  

## 2021-07-20 NOTE — Progress Notes (Signed)
Subjective:    Patient ID: Brandi [name], female    DOB: 01-Aug-1986, 35 y.o.   MRN: [id number]  HPI  Patient presents the clinic today for her annual exam. She also has FMLA forms she would like completed for her IBS.  Flu: never Tetanus: > 10 years ago COVID: [place] x 2 Pap smear: 08/2016 Dentist: as needed  Diet: She does eat meat. She consumes fruits and veggies. She does eat some fried foods. She drinks mostly water and Dr. Malachi Bonds. Exercise: Gym for 30 min- 1 hour 3 x week  Review of Systems     Past Medical History:  Diagnosis Date   Abnormal Pap smear of cervix 12/2015   ascus/pos- achd   Allergies    Arthritis    Asthma    Chlamydial cervicitis    Depression    pt has past treatment w/ Zoloft - good symptom control, but worsening IBS symptoms   Genital HSV    GERD (gastroesophageal reflux disease)    Heart murmur    Hypertension    IBS (irritable bowel syndrome)    Trichomoniasis     Current Outpatient Medications  Medication Sig Dispense Refill   Albuterol Sulfate 108 (90 Base) MCG/ACT AEPB Inhale into the lungs.     cyclobenzaprine (FLEXERIL) 10 MG tablet TAKE 1 TABLET BY MOUTH THREE TIMES A DAY AS NEEDED FOR MUSCLE SPASMS 90 tablet 1   fluticasone (FLONASE) 50 MCG/ACT nasal spray Place 1 spray into both nostrils 2 (two) times daily.     hydrocortisone (ANUSOL-HC) 2.5 % rectal cream Place 1 application rectally 2 (two) times daily as needed for hemorrhoids or anal itching. 30 g 0   lisinopril-hydrochlorothiazide (ZESTORETIC) 10-12.5 MG tablet Take 1 tablet by mouth daily. 90 tablet 1   loratadine (CLARITIN) 10 MG tablet Take 10 mg by mouth daily as needed for allergies.     Multiple Vitamins-Minerals (WOMENS DAILY FORMULA PO) Take by mouth.     omeprazole (PRILOSEC) 20 MG capsule Take 1 capsule (20 mg total) by mouth daily. 90 capsule 1   rizatriptan (MAXALT) 10 MG tablet TAKE 1 TABLET BY MOUTH AS NEEDED FOR MIGRAINE. MAY REPEAT IN 2 HOURS IF NEEDED 10  tablet 0   valACYclovir (VALTREX) 500 MG tablet 1 TABLET TWICE A DAY X 3 DAYS AS NEEDED FOR OUTBREAKS 30 tablet 0   vitamin B-12 (CYANOCOBALAMIN) 500 MCG tablet Take 500 mcg by mouth daily.     No current facility-administered medications for this visit.    Allergies  Allergen Reactions   Viberzi [Eluxadoline] Itching   Amoxicillin Nausea And Vomiting and Rash    Family History  Problem Relation Age of Onset   Diabetes Mother    Hypertension Mother    Heart disease Father    Heart attack Father    Heart disease Maternal Grandfather    Heart disease Paternal Grandfather    Hypertension Sister    Lung cancer Maternal Grandmother    Lung cancer Paternal Grandmother    ADD / ADHD Son    Cancer Neg Hx     Social History   Socioeconomic History   Marital status: Single    Spouse name: Not on file   Number of children: 1   Years of education: Not on file   Highest education level: Some college, no degree  Occupational History   Not on file  Tobacco Use   Smoking status: Every Day    Packs/day: 1.00    Years:  12.00    Pack years: 12.00    Types: Cigarettes   Smokeless tobacco: Never  Vaping Use   Vaping Use: Never used  Substance and Sexual Activity   Alcohol use: Yes    Alcohol/week: 1.0 standard drink    Types: 1 Glasses of wine per week    Comment: rare   Drug use: Yes    Frequency: 7.0 times per week    Types: Marijuana    Comment: once daily at night (pt admits is self-medication for depression/insomnia)   Sexual activity: Yes    Birth control/protection: Injection  Other Topics Concern   Not on file  Social History Narrative   Not on file   Social Determinants of Health   Financial Resource Strain: Not on file  Food Insecurity: Not on file  Transportation Needs: Not on file  Physical Activity: Not on file  Stress: Not on file  Social Connections: Not on file  Intimate Partner Violence: Not on file     Constitutional: Patient reports  intermittent headaches.  Denies fever, malaise, fatigue, or abrupt weight changes.  HEENT: Denies eye pain, eye redness, ear pain, ringing in the ears, wax buildup, runny nose, nasal congestion, bloody nose, or sore throat. Respiratory: Denies difficulty breathing, shortness of breath, cough or sputum production.   Cardiovascular: Denies chest pain, chest tightness, palpitations or swelling in the hands or feet.  Gastrointestinal: Patient reports intermittent reflux, diarrhea.  Denies abdominal pain, bloating, constipation, or blood in the stool.  GU: Pt reports vaginal itching and discharge after sex. Denies urgency, frequency, pain with urination, burning sensation, blood in urine, odor or discharge. Musculoskeletal: Patient reports joint pain.  Denies decrease in range of motion, difficulty with gait, muscle pain or joint swelling.  Skin: Denies redness, rashes, lesions or ulcercations.  Neurological: Denies dizziness, difficulty with memory, difficulty with speech or problems with balance and coordination.  Psych: Patient has a history of depression.  Denies anxiety, SI/HI.  No other specific complaints in a complete review of systems (except as listed in HPI above).  Objective:   Physical Exam BP 118/81 (BP Location: Left Arm, Patient Position: Sitting, Cuff Size: Large)   Pulse 95   Temp 98.2 F (36.8 C) (Temporal)   Resp 18   Ht 5\' 6"  (1.676 m)   Wt 240 lb (108.9 kg)   SpO2 100%   BMI 38.74 kg/m   Wt Readings from Last 3 Encounters:  01/13/21 248 lb 6.4 oz (112.7 kg)  12/29/20 244 lb (110.7 kg)  10/04/20 253 lb (114.8 kg)    General: Appears her stated age, obese, in NAD. Skin: Warm, dry and intact.  HEENT: Head: normal shape and size; Eyes: sclera white and EOMs intact;  Neck:  Neck supple, trachea midline. No masses, lumps present. Thyromegaly noted. Cardiovascular: Normal rate and rhythm. S1,S2 noted.  No murmur, rubs or gallops noted. No JVD or BLE edema.   Pulmonary/Chest: Normal effort and positive vesicular breath sounds. No respiratory distress. No wheezes, rales or ronchi noted.  Abdomen: Soft and nontender. Normal bowel sounds. Musculoskeletal: Strength 5/5 BUE/BLE.  No difficulty with gait.  Neurological: Alert and oriented. Cranial nerves II-XII grossly intact. Coordination normal.  Psychiatric: Mood and affect normal. Behavior is normal. Judgment and thought content normal.     BMET    Component Value Date/Time   NA 139 05/30/2020 1051   K 4.2 05/30/2020 1051   CL 105 05/30/2020 1051   CO2 21 05/30/2020 1051   GLUCOSE  97 05/30/2020 1051   BUN 7 05/30/2020 1051   CREATININE 0.71 05/30/2020 1051   CALCIUM 10.0 05/30/2020 1051   GFRNONAA 112 05/30/2020 1051   GFRAA 130 05/30/2020 1051    Lipid Panel     Component Value Date/Time   CHOL 197 05/30/2020 1051   TRIG 221 (H) 05/30/2020 1051   HDL 41 (L) 05/30/2020 1051   CHOLHDL 4.8 05/30/2020 1051   LDLCALC 123 (H) 05/30/2020 1051    CBC    Component Value Date/Time   WBC 10.0 05/30/2020 1051   RBC 5.34 (H) 05/30/2020 1051   HGB 16.3 (H) 05/30/2020 1051   HCT 49.1 (H) 05/30/2020 1051   PLT 409 (H) 05/30/2020 1051   MCV 91.9 05/30/2020 1051   MCH 30.5 05/30/2020 1051   MCHC 33.2 05/30/2020 1051   RDW 12.3 05/30/2020 1051   LYMPHSABS 2,970 05/30/2020 1051   EOSABS 210 05/30/2020 1051   BASOSABS 40 05/30/2020 1051    Hgb A1C No results found for: HGBA1C         Assessment & Plan:   Preventative Health Maintenance:  She declines flu shot  today She declines tetanus vaccine today Pap smear due- she will call back to reschedule Encouraged her to consume a balanced diet and exercise regimen Advised her to see a dentist annually Will check CBC, CMET, Lipid, A1C, HIV and Hep C today  Vaginal Discharge and Itching after Intercourse:  Will check A1C today Symptoms improve with AZO and Monistat OTC Could consider Diflucan vs Terconazole pending  labs  RTC in 6 months, follow-up chronic conditions Webb Silversmith, NP This visit occurred during the SARS-CoV-2 public health emergency.  Safety protocols were in place, including screening questions prior to the visit, additional usage of staff PPE, and extensive cleaning of exam room while observing appropriate contact time as indicated for disinfecting solutions.

## 2021-07-20 NOTE — Assessment & Plan Note (Signed)
CMET and lipid profile today Encouraged her to consume a low fat diet 

## 2021-07-20 NOTE — Patient Instructions (Signed)

## 2021-07-20 NOTE — Assessment & Plan Note (Signed)
FMLA forms will be completed and faxed back to [place]

## 2021-07-20 NOTE — Assessment & Plan Note (Signed)
Encouraged diet and exercise for weight loss ?

## 2021-07-20 NOTE — Assessment & Plan Note (Signed)
Likely due to nicotene use CBC today Encouraged smoking cessation

## 2021-07-21 ENCOUNTER — Encounter: Payer: Self-pay | Admitting: Internal Medicine

## 2021-07-21 LAB — HEMOGLOBIN A1C
Hgb A1c MFr Bld: 5.9 % of total Hgb — ABNORMAL HIGH (ref <5.7)
Mean Plasma Glucose: 123 mg/dL
eAG (mmol/L): 6.8 mmol/L

## 2021-07-21 LAB — COMPLETE METABOLIC PANEL WITH GFR
AG Ratio: 1.4 (calc) (ref 1.0–2.5)
ALT: 33 U/L — ABNORMAL HIGH (ref 6–29)
AST: 22 U/L (ref 10–30)
Albumin: 4.1 g/dL (ref 3.6–5.1)
Alkaline phosphatase (APISO): 74 U/L (ref 31–125)
BUN/Creatinine Ratio: 7 (calc) (ref 6–22)
BUN: 5 mg/dL — ABNORMAL LOW (ref 7–25)
CO2: 24 mmol/L (ref 20–32)
Calcium: 9.6 mg/dL (ref 8.6–10.2)
Chloride: 105 mmol/L (ref 98–110)
Creat: 0.72 mg/dL (ref 0.50–0.97)
Globulin: 3 g/dL (calc) (ref 1.9–3.7)
Glucose, Bld: 100 mg/dL — ABNORMAL HIGH (ref 65–99)
Potassium: 4.1 mmol/L (ref 3.5–5.3)
Sodium: 140 mmol/L (ref 135–146)
Total Bilirubin: 0.4 mg/dL (ref 0.2–1.2)
Total Protein: 7.1 g/dL (ref 6.1–8.1)
eGFR: 112 mL/min/{1.73_m2} (ref >=60)

## 2021-07-21 LAB — CBC
HCT: 44.6 % (ref 35.0–45.0)
Hemoglobin: 14.8 g/dL (ref 11.7–15.5)
MCH: 31.3 pg (ref 27.0–33.0)
MCHC: 33.2 g/dL (ref 32.0–36.0)
MCV: 94.3 fL (ref 80.0–100.0)
MPV: 11.7 fL (ref 7.5–12.5)
Platelets: 418 10*3/uL — ABNORMAL HIGH (ref 140–400)
RBC: 4.73 10*6/uL (ref 3.80–5.10)
RDW: 11.6 % (ref 11.0–15.0)
WBC: 9.1 10*3/uL (ref 3.8–10.8)

## 2021-07-21 LAB — HEPATITIS C ANTIBODY
Hepatitis C Ab: NONREACTIVE
SIGNAL TO CUT-OFF: 0.03 (ref <1.00)

## 2021-07-21 LAB — LIPID PANEL
Cholesterol: 206 mg/dL — ABNORMAL HIGH (ref <200)
HDL: 41 mg/dL — ABNORMAL LOW (ref >=50)
LDL Cholesterol (Calc): 134 mg/dL (calc) — ABNORMAL HIGH
Non-HDL Cholesterol (Calc): 165 mg/dL (calc) — ABNORMAL HIGH (ref <130)
Total CHOL/HDL Ratio: 5 (calc) — ABNORMAL HIGH (ref <5.0)
Triglycerides: 168 mg/dL — ABNORMAL HIGH (ref <150)

## 2021-07-21 LAB — HIV ANTIBODY (ROUTINE TESTING W REFLEX): HIV 1&2 Ab, 4th Generation: NONREACTIVE

## 2021-07-24 MED ORDER — ATORVASTATIN CALCIUM 20 MG PO TABS: 20.0000 mg | ORAL_TABLET | Freq: Every day | ORAL | 2 refills | Status: DC

## 2021-07-31 ENCOUNTER — Encounter: Payer: Self-pay | Admitting: Internal Medicine

## 2021-07-31 DIAGNOSIS — K219 Gastro-esophageal reflux disease without esophagitis: Secondary | ICD-10-CM

## 2021-08-01 MED ORDER — OMEPRAZOLE 20 MG PO CPDR: 20.0000 mg | DELAYED_RELEASE_CAPSULE | Freq: Every day | ORAL | 1 refills | Status: DC

## 2021-08-10 DIAGNOSIS — J208 Acute bronchitis due to other specified organisms: Secondary | ICD-10-CM | POA: Diagnosis not present

## 2021-08-10 DIAGNOSIS — J0100 Acute maxillary sinusitis, unspecified: Secondary | ICD-10-CM | POA: Diagnosis not present

## 2021-08-14 ENCOUNTER — Encounter: Payer: Self-pay | Admitting: Internal Medicine

## 2021-08-15 MED ORDER — FLUTICASONE PROPIONATE 50 MCG/ACT NA SUSP: 1.0000 | Freq: Two times a day (BID) | NASAL | 0 refills | Status: DC

## 2021-08-17 DIAGNOSIS — F331 Major depressive disorder, recurrent, moderate: Secondary | ICD-10-CM | POA: Diagnosis not present

## 2021-09-03 NOTE — L&D Delivery Note (Addendum)
To L&D for NST Standing order. Transported via [facility] by Coral Ceo RN

## 2021-09-07 DIAGNOSIS — F331 Major depressive disorder, recurrent, moderate: Secondary | ICD-10-CM | POA: Diagnosis not present

## 2021-09-20 ENCOUNTER — Encounter: Payer: Self-pay | Admitting: Internal Medicine

## 2021-09-20 ENCOUNTER — Other Ambulatory Visit: Payer: Self-pay

## 2021-09-20 DIAGNOSIS — K219 Gastro-esophageal reflux disease without esophagitis: Secondary | ICD-10-CM

## 2021-09-20 MED ORDER — OMEPRAZOLE 20 MG PO CPDR: 20.0000 mg | DELAYED_RELEASE_CAPSULE | Freq: Every day | ORAL | 1 refills | Status: DC

## 2021-10-05 DIAGNOSIS — F331 Major depressive disorder, recurrent, moderate: Secondary | ICD-10-CM | POA: Diagnosis not present

## 2021-10-10 ENCOUNTER — Other Ambulatory Visit: Payer: Self-pay | Admitting: Internal Medicine

## 2021-10-10 DIAGNOSIS — I10 Essential (primary) hypertension: Secondary | ICD-10-CM

## 2021-10-10 NOTE — Telephone Encounter (Signed)
Requested Prescriptions  Pending Prescriptions Disp Refills   lisinopril-hydrochlorothiazide (ZESTORETIC) 10-12.5 MG tablet [Pharmacy Med Name: LISINOPRIL-HCTZ 10-12.5 MG TAB] 30 tablet 5    Sig: TAKE 1 TABLET BY MOUTH EVERY DAY     Cardiovascular:  ACEI + Diuretic Combos Passed - 10/10/2021  1:31 AM      Passed - Na in normal range and within 180 days    Sodium  Date Value Ref Range Status  07/20/2021 140 135 - 146 mmol/L Final         Passed - K in normal range and within 180 days    Potassium  Date Value Ref Range Status  07/20/2021 4.1 3.5 - 5.3 mmol/L Final         Passed - Cr in normal range and within 180 days    Creat  Date Value Ref Range Status  07/20/2021 0.72 0.50 - 0.97 mg/dL Final         Passed - eGFR is 30 or above and within 180 days    GFR, Est African American  Date Value Ref Range Status  05/30/2020 130 > OR = 60 mL/min/1.26m Final   GFR, Est Non African American  Date Value Ref Range Status  05/30/2020 112 > OR = 60 mL/min/1.785mFinal   eGFR  Date Value Ref Range Status  07/20/2021 112 > OR = 60 mL/min/1.7369minal    Comment:    The eGFR is based on the CKD-EPI 2021 equation. To calculate  the new eGFR from a previous Creatinine or Cystatin C result, go to https://www.kidney.org/professionals/ kdoqi/gfr%5Fcalculator          Passed - Patient is not pregnant      Passed - Last BP in normal range    BP Readings from Last 1 Encounters:  07/20/21 118/81         Passed - Valid encounter within last 6 months    Recent Outpatient Visits          2 months ago Encounter for general adult medical examination with abnormal findings   [facility]   9 months ago Essential hypertension   [facility]   1 year ago Chronic bilateral low back pain without sciatica   [facility]   2 years ago Scoliosis, unspecified scoliosis type, unspecified  spinal region   [facility]   2 years ago Encounter for surveillance of injectable contraceptive   [facility]      Future Appointments            In 3 months Baity, RegCoralie KeensP [facility]

## 2021-10-21 ENCOUNTER — Encounter: Payer: Self-pay | Admitting: Emergency Medicine

## 2021-10-21 ENCOUNTER — Ambulatory Visit (INDEPENDENT_AMBULATORY_CARE_PROVIDER_SITE_OTHER): Payer: BC Managed Care – PPO

## 2021-10-21 ENCOUNTER — Ambulatory Visit
Admission: EM | Admit: 2021-10-21 | Discharge: 2021-10-21 | Disposition: A | Discharge status: Home / Self Care | Payer: BC Managed Care – PPO | Attending: Family Medicine | Admitting: Family Medicine

## 2021-10-21 ENCOUNTER — Other Ambulatory Visit: Payer: Self-pay

## 2021-10-21 DIAGNOSIS — S8992XA Unspecified injury of left lower leg, initial encounter: Secondary | ICD-10-CM

## 2021-10-21 DIAGNOSIS — W19XXXA Unspecified fall, initial encounter: Secondary | ICD-10-CM | POA: Diagnosis not present

## 2021-10-21 DIAGNOSIS — S6990XA Unspecified injury of unspecified wrist, hand and finger(s), initial encounter: Secondary | ICD-10-CM | POA: Diagnosis not present

## 2021-10-21 DIAGNOSIS — M25531 Pain in right wrist: Secondary | ICD-10-CM

## 2021-10-21 DIAGNOSIS — M25562 Pain in left knee: Secondary | ICD-10-CM

## 2021-10-21 MED ORDER — CYCLOBENZAPRINE HCL 10 MG PO TABS: 10.0000 mg | ORAL_TABLET | Freq: Two times a day (BID) | ORAL | 0 refills | Status: DC | PRN

## 2021-10-21 NOTE — ED Provider Notes (Signed)
Roderic Palau    CSN: [id number] Arrival date & time: 10/21/21  2409      History   Chief Complaint Chief Complaint  Patient presents with   Wrist Pain   Knee Pain    HPI MARIAME RYBOLT is a 36 y.o. female.   HPI Patient presents today for evaluation of a left knee injury and right wrist injury following a fall x1 day ago. She currently experiencing left knee pain although is fully ambulatory. She experiences pain with flexion and lateral movement of wrist. She has  taken Naproxen without significant relief. Wrist is swollen and knee has an abrasive wound with localized swelling to patella region of the knee.  Past Medical History:  Diagnosis Date   Abnormal Pap smear of cervix 12/2015   ascus/pos- achd   Allergies    Arthritis    Asthma    Chlamydial cervicitis    Depression    pt has past treatment w/ Zoloft - good symptom control, but worsening IBS symptoms   Genital HSV    GERD (gastroesophageal reflux disease)    Heart murmur    Hypertension    IBS (irritable bowel syndrome)    Trichomoniasis     Patient Active Problem List   Diagnosis Date Noted   Mixed hyperlipidemia 07/20/2021   Thrombocytosis 07/20/2021   Polycythemia 07/20/2021   Class 2 severe obesity due to excess calories with serious comorbidity and body mass index (BMI) of 38.0 to 38.9 in adult ([place]) 07/20/2021   Genital herpes 02/03/2021   Chronic lower back pain 05/30/2020   Migraine 05/30/2020   Chronic pain of both knees 05/30/2020   Irritable bowel syndrome with diarrhea 10/22/2018   Essential hypertension 10/22/2018   Recurrent major depressive disorder, in partial remission ([place]) 10/22/2018   Gastroesophageal reflux disease 10/22/2018    Past Surgical History:  Procedure Laterality Date   COLONOSCOPY WITH PROPOFOL N/A 12/29/2020   Procedure: COLONOSCOPY WITH BIOPSY;  Surgeon: Lin Landsman, MD;  Location: [facility];  Service: Endoscopy;  Laterality: N/A;    ESOPHAGOGASTRODUODENOSCOPY (EGD) WITH PROPOFOL N/A 12/29/2020   Procedure: ESOPHAGOGASTRODUODENOSCOPY (EGD) WITH BIOPSY;  Surgeon: Lin Landsman, MD;  Location: [facility];  Service: Endoscopy;  Laterality: N/A;   NO PAST SURGERIES     POLYPECTOMY N/A 12/29/2020   Procedure: POLYPECTOMY;  Surgeon: Lin Landsman, MD;  Location: [facility];  Service: Endoscopy;  Laterality: N/A;    OB History     Gravida  1   Para  1   Term  1   Preterm      AB      Living  1      SAB      IAB      Ectopic      Multiple      Live Births  1            Home Medications    Prior to Admission medications   Medication Sig Start Date End Date Taking? Authorizing Provider  cyclobenzaprine (FLEXERIL) 10 MG tablet Take 1 tablet (10 mg total) by mouth 2 (two) times daily as needed for muscle spasms. 10/21/21  Yes Scot Jun, FNP  lisinopril-hydrochlorothiazide (ZESTORETIC) 10-12.5 MG tablet TAKE 1 TABLET BY MOUTH EVERY DAY 10/10/21  Yes Baity, Coralie Keens, NP  Multiple Vitamins-Minerals (WOMENS DAILY FORMULA PO) Take by mouth.   Yes [provider]  Albuterol Sulfate 108 (90 Base) MCG/ACT AEPB Inhale into the lungs.  10/24/18 12/29/20  [provider]  atorvastatin (LIPITOR) 20 MG tablet Take 1 tablet (20 mg total) by mouth daily. 07/24/21   Jearld Fenton, NP  fluticasone (FLONASE) 50 MCG/ACT nasal spray Place 1 spray into both nostrils 2 (two) times daily. 08/15/21   Jearld Fenton, NP  hydrocortisone (ANUSOL-HC) 2.5 % rectal cream Place 1 application rectally 2 (two) times daily as needed for hemorrhoids or anal itching. 10/22/18   Mikey College, NP  loratadine (CLARITIN) 10 MG tablet Take 10 mg by mouth daily as needed for allergies.    [provider]  omeprazole (PRILOSEC) 20 MG capsule Take 1 capsule (20 mg total) by mouth daily. 09/20/21   Jearld Fenton, NP  rizatriptan (MAXALT) 10 MG tablet TAKE 1 TABLET BY MOUTH AS  NEEDED FOR MIGRAINE. MAY REPEAT IN 2 HOURS IF NEEDED 06/22/20   Malfi, Lupita Raider, FNP  valACYclovir (VALTREX) 500 MG tablet 1 TABLET TWICE A DAY X 3 DAYS AS NEEDED FOR OUTBREAKS 07/10/21   Jearld Fenton, NP  vitamin B-12 (CYANOCOBALAMIN) 500 MCG tablet Take 500 mcg by mouth daily.    [provider]    Family History Family History  Problem Relation Age of Onset   Diabetes Mother    Hypertension Mother    Heart disease Father    Heart attack Father    Heart disease Maternal Grandfather    Heart disease Paternal Grandfather    Hypertension Sister    Lung cancer Maternal Grandmother    Lung cancer Paternal Grandmother    ADD / ADHD Son    Cancer Neg Hx     Social History Social History   Tobacco Use   Smoking status: Every Day    Packs/day: 1.00    Years: 12.00    Pack years: 12.00    Types: Cigarettes   Smokeless tobacco: Never  Vaping Use   Vaping Use: Every day   Substances: Nicotine, Flavoring  Substance Use Topics   Alcohol use: Yes    Alcohol/week: 1.0 standard drink    Types: 1 Glasses of wine per week    Comment: rare   Drug use: Yes    Frequency: 7.0 times per week    Types: Marijuana    Comment: once daily at night (pt admits is self-medication for depression/insomnia)     Allergies   Viberzi [eluxadoline] and Amoxicillin   Review of Systems Review of Systems Pertinent negatives listed in HPI   Physical Exam Triage Vital Signs ED Triage Vitals  Enc Vitals Group     BP 10/21/21 0928 124/88     Pulse Rate 10/21/21 0928 90     Resp 10/21/21 0928 18     Temp 10/21/21 0928 98.7 F (37.1 C)     Temp Source 10/21/21 0928 Oral     SpO2 10/21/21 0928 95 %     Weight --      Height --      Head Circumference --      Peak Flow --      Pain Score 10/21/21 0923 2     Pain Loc --      Pain Edu? --      Excl. in [place]? --    No data found.  Updated Vital Signs BP 124/88 (BP Location: Left Arm)    Pulse 90    Temp 98.7 F (37.1 C) (Oral)     Resp 18    LMP 10/02/2021    SpO2  95%   Visual Acuity Right Eye Distance:   Left Eye Distance:   Bilateral Distance:    Right Eye Near:   Left Eye Near:    Bilateral Near:     Physical Exam Vitals reviewed.  HENT:     Head: Normocephalic.     Nose: Nose normal.  Cardiovascular:     Rate and Rhythm: Normal rate and regular rhythm.  Pulmonary:     Effort: Pulmonary effort is normal.     Breath sounds: Normal breath sounds.  Musculoskeletal:     Right wrist: Tenderness present. Decreased range of motion.     Cervical back: Normal range of motion and neck supple.     Left knee: Tenderness present.  Skin:    General: Skin is warm and dry.     Capillary Refill: Capillary refill takes less than 2 seconds.  Neurological:     General: No focal deficit present.     Mental Status: She is alert.  Psychiatric:        Attention and Perception: Attention normal.        Speech: Speech normal.    UC Treatments / Results  Labs (all labs ordered are listed, but only abnormal results are displayed) Labs Reviewed - No data to display  EKG   Radiology DG Wrist Complete Right  Result Date: 10/21/2021 CLINICAL DATA:  Right wrist pain EXAM: RIGHT WRIST - COMPLETE 3+ VIEW COMPARISON:  None. FINDINGS: There is no evidence of fracture or dislocation. There is no evidence of arthropathy or other focal bone abnormality. Soft tissues are unremarkable. IMPRESSION: No acute osseous abnormality identified. Electronically Signed   By: Ofilia Neas M.D.   On: 10/21/2021 10:16   DG Knee 2 Views Left  Result Date: 10/21/2021 CLINICAL DATA:  Fall, knee pain EXAM: LEFT KNEE - 1-2 VIEW COMPARISON:  None. FINDINGS: No evidence of fracture, dislocation, or joint effusion. No evidence of arthropathy or other focal bone abnormality. Prepatellar soft tissue prominence/swelling. IMPRESSION: No acute osseous abnormality identified. Prepatellar soft tissue prominence/swelling. Electronically Signed   By:  Ofilia Neas M.D.   On: 10/21/2021 10:15    Procedures Procedures (including critical care time)  Medications Ordered in UC Medications - No data to display  Initial Impression / Assessment and Plan / UC Course  I have reviewed the triage vital signs and the nursing notes.  Pertinent labs & imaging results that were available during my care of the patient were reviewed by me and considered in my medical decision making (see chart for details).    Imaging negative for fracture involving the right wrist and left knee following a fall injury overnight. Recommend conservative management with RICE. Continue Naproxen for inflammation and prescribed Flexeril for acute pain. Ortho follow-up  if symptoms worsen or do not improve.  Final Clinical Impressions(s) / UC Diagnoses   Final diagnoses:  Wrist injuries  Fall  Left knee injury, initial encounter     Discharge Instructions      Continue naproxen as directed take 2 tablets every 12 hours to reduce inflammation and swelling.  Recommend wearing an Ace wrap and unwrapping to ice at least 3 times a day.  When sitting elevate right wrist and left knee to reduce swelling. Prescribed a short course of Flexeril for acute pain     ED Prescriptions     Medication Sig Dispense Auth. Provider   cyclobenzaprine (FLEXERIL) 10 MG tablet Take 1 tablet (10 mg total) by mouth 2 (two) times  daily as needed for muscle spasms. 20 tablet Scot Jun, FNP      PDMP not reviewed this encounter.   Scot Jun, FNP 10/21/21 1035

## 2021-10-21 NOTE — ED Triage Notes (Signed)
Pt fell last night and she has right wrist and left knee pain.

## 2021-10-21 NOTE — Discharge Instructions (Signed)
Continue naproxen as directed take 2 tablets every 12 hours to reduce inflammation and swelling.  Recommend wearing an Ace wrap and unwrapping to ice at least 3 times a day.  When sitting elevate right wrist and left knee to reduce swelling. Prescribed a short course of Flexeril for acute pain

## 2021-11-02 DIAGNOSIS — F331 Major depressive disorder, recurrent, moderate: Secondary | ICD-10-CM | POA: Diagnosis not present

## 2021-11-16 DIAGNOSIS — F331 Major depressive disorder, recurrent, moderate: Secondary | ICD-10-CM | POA: Diagnosis not present

## 2021-11-30 DIAGNOSIS — F331 Major depressive disorder, recurrent, moderate: Secondary | ICD-10-CM | POA: Diagnosis not present

## 2021-12-28 DIAGNOSIS — F331 Major depressive disorder, recurrent, moderate: Secondary | ICD-10-CM | POA: Diagnosis not present

## 2022-01-07 DIAGNOSIS — J020 Streptococcal pharyngitis: Secondary | ICD-10-CM | POA: Diagnosis not present

## 2022-01-07 DIAGNOSIS — J019 Acute sinusitis, unspecified: Secondary | ICD-10-CM | POA: Diagnosis not present

## 2022-01-07 DIAGNOSIS — N760 Acute vaginitis: Secondary | ICD-10-CM | POA: Diagnosis not present

## 2022-01-07 DIAGNOSIS — J029 Acute pharyngitis, unspecified: Secondary | ICD-10-CM | POA: Diagnosis not present

## 2022-01-11 ENCOUNTER — Encounter: Payer: Self-pay | Admitting: Internal Medicine

## 2022-01-11 ENCOUNTER — Other Ambulatory Visit: Payer: Self-pay | Admitting: Internal Medicine

## 2022-01-11 DIAGNOSIS — I10 Essential (primary) hypertension: Secondary | ICD-10-CM

## 2022-01-11 NOTE — Telephone Encounter (Signed)
LRF 10/10/21  #30  3 refills Pt has refill available ?Requested Prescriptions  ?Pending Prescriptions Disp Refills  ?? lisinopril-hydrochlorothiazide (ZESTORETIC) 10-12.5 MG tablet [Pharmacy Med Name: LISINOPRIL-HCTZ 10-12.5 MG TAB] 90 tablet 1  ?  Sig: TAKE 1 TABLET BY MOUTH EVERY DAY  ?  ? Cardiovascular:  ACEI + Diuretic Combos Passed - 01/11/2022  2:26 AM  ?  ?  Passed - Na in normal range and within 180 days  ?  Sodium  ?Date Value Ref Range Status  ?07/20/2021 140 135 - 146 mmol/L Final  ?   ?  ?  Passed - K in normal range and within 180 days  ?  Potassium  ?Date Value Ref Range Status  ?07/20/2021 4.1 3.5 - 5.3 mmol/L Final  ?   ?  ?  Passed - Cr in normal range and within 180 days  ?  Creat  ?Date Value Ref Range Status  ?07/20/2021 0.72 0.50 - 0.97 mg/dL Final  ?   ?  ?  Passed - eGFR is 30 or above and within 180 days  ?  GFR, Est African American  ?Date Value Ref Range Status  ?05/30/2020 130 > OR = 60 mL/min/1.28m Final  ? ?GFR, Est Non African American  ?Date Value Ref Range Status  ?05/30/2020 112 > OR = 60 mL/min/1.732mFinal  ? ?eGFR  ?Date Value Ref Range Status  ?07/20/2021 112 > OR = 60 mL/min/1.7352minal  ?  Comment:  ?  The eGFR is based on the CKD-EPI 2021 equation. To calculate  ?the new eGFR from a previous Creatinine or Cystatin C ?result, go to https://www.kidney.org/professionals/ ?kdoqi/gfr%5Fcalculator ?  ?   ?  ?  Passed - Patient is not pregnant  ?  ?  Passed - Last BP in normal range  ?  BP Readings from Last 1 Encounters:  ?10/21/21 124/88  ?   ?  ?  Passed - Valid encounter within last 6 months  ?  Recent Outpatient Visits   ?      ? 5 months ago [facility]counter for general adult medical examination with abnormal findings  [facility]  ? 12 months ago Essential hypertension  ? [facility]  ? 1 year ago Chronic bilateral low back pain without sciatica  ? [facility]  ? 2 years ago  Scoliosis, unspecified scoliosis type, unspecified spinal region  ? [facility]  ? 2 years ago Encounter for surveillance of injectable contraceptive  ? [facility]  ?  ?  ?Future Appointments   ?        ? In 1 week Baity, [facility]oral[facility]P [facility]  ? ?  ?  ?  ? ? ?

## 2022-01-19 ENCOUNTER — Encounter: Payer: Self-pay | Admitting: Internal Medicine

## 2022-01-19 ENCOUNTER — Ambulatory Visit: Payer: BC Managed Care – PPO | Admitting: Internal Medicine

## 2022-01-19 VITALS — BP 138/86 | HR 99 | Temp 97.3°F | Wt 248.0 lb

## 2022-01-19 DIAGNOSIS — F3341 Major depressive disorder, recurrent, in partial remission: Secondary | ICD-10-CM

## 2022-01-19 DIAGNOSIS — K219 Gastro-esophageal reflux disease without esophagitis: Secondary | ICD-10-CM | POA: Diagnosis not present

## 2022-01-19 DIAGNOSIS — R7303 Prediabetes: Secondary | ICD-10-CM | POA: Diagnosis not present

## 2022-01-19 DIAGNOSIS — D75839 Thrombocytosis, unspecified: Secondary | ICD-10-CM

## 2022-01-19 DIAGNOSIS — A6004 Herpesviral vulvovaginitis: Secondary | ICD-10-CM

## 2022-01-19 DIAGNOSIS — M545 Low back pain, unspecified: Secondary | ICD-10-CM

## 2022-01-19 DIAGNOSIS — M25562 Pain in left knee: Secondary | ICD-10-CM

## 2022-01-19 DIAGNOSIS — M25561 Pain in right knee: Secondary | ICD-10-CM

## 2022-01-19 DIAGNOSIS — G8929 Other chronic pain: Secondary | ICD-10-CM

## 2022-01-19 DIAGNOSIS — I10 Essential (primary) hypertension: Secondary | ICD-10-CM | POA: Diagnosis not present

## 2022-01-19 DIAGNOSIS — K580 Irritable bowel syndrome with diarrhea: Secondary | ICD-10-CM

## 2022-01-19 DIAGNOSIS — G43809 Other migraine, not intractable, without status migrainosus: Secondary | ICD-10-CM | POA: Diagnosis not present

## 2022-01-19 HISTORY — DX: Prediabetes: R73.03

## 2022-01-19 NOTE — Assessment & Plan Note (Signed)
Encourage stretching and core strengthening We will hold off on muscle relaxers while pregnant

## 2022-01-19 NOTE — Assessment & Plan Note (Signed)
Controlled off her Lisinopril HCTZ but she stopped because she was pregnant We will have her continue to hold this until she sees GYN in 2 weeks Reinforced DASH diet and exercise for weight loss C-Met today

## 2022-01-19 NOTE — Assessment & Plan Note (Signed)
Encourage diet and exercise for weight loss 

## 2022-01-19 NOTE — Assessment & Plan Note (Signed)
Continue Valacyclovir as needed

## 2022-01-19 NOTE — Assessment & Plan Note (Signed)
Try to avoid triggers that cause her migraines Continue Rizatriptan as needed

## 2022-01-19 NOTE — Assessment & Plan Note (Signed)
Encouraged her to avoid foods that trigger her reflux Encourage weight loss as this can help reduce reflux symptoms Continue Omeprazole

## 2022-01-19 NOTE — Patient Instructions (Signed)
Hypertension, Adult ?Hypertension is another name for high blood pressure. High blood pressure forces your heart to work harder to pump blood. This can cause problems over time. ?There are two numbers in a blood pressure reading. There is a top number (systolic) over a bottom number (diastolic). It is best to have a blood pressure that is below 120/80. ?What are the causes? ?The cause of this condition is not known. Some other conditions can lead to high blood pressure. ?What increases the risk? ?Some lifestyle factors can make you more likely to develop high blood pressure: ?Smoking. ?Not getting enough exercise or physical activity. ?Being overweight. ?Having too much fat, sugar, calories, or salt (sodium) in your diet. ?Drinking too much alcohol. ?Other risk factors include: ?Having any of these conditions: ?Heart disease. ?Diabetes. ?High cholesterol. ?Kidney disease. ?Obstructive sleep apnea. ?Having a family history of high blood pressure and high cholesterol. ?Age. The risk increases with age. ?Stress. ?What are the signs or symptoms? ?High blood pressure may not cause symptoms. Very high blood pressure (hypertensive crisis) may cause: ?Headache. ?Fast or uneven heartbeats (palpitations). ?Shortness of breath. ?Nosebleed. ?Vomiting or feeling like you may vomit (nauseous). ?Changes in how you see. ?Very bad chest pain. ?Feeling dizzy. ?Seizures. ?How is this treated? ?This condition is treated by making healthy lifestyle changes, such as: ?Eating healthy foods. ?Exercising more. ?Drinking less alcohol. ?Your doctor may prescribe medicine if lifestyle changes do not help enough and if: ?Your top number is above 130. ?Your bottom number is above 80. ?Your personal target blood pressure may vary. ?Follow these instructions at home: ?Eating and drinking ? ?If told, follow the DASH eating plan. To follow this plan: ?Fill one half of your plate at each meal with fruits and vegetables. ?Fill one fourth of your plate  at each meal with whole grains. Whole grains include whole-wheat pasta, brown rice, and whole-grain bread. ?Eat or drink low-fat dairy products, such as skim milk or low-fat yogurt. ?Fill one fourth of your plate at each meal with low-fat (lean) proteins. Low-fat proteins include fish, chicken without skin, eggs, beans, and tofu. ?Avoid fatty meat, cured and processed meat, or chicken with skin. ?Avoid pre-made or processed food. ?Limit the amount of salt in your diet to less than 1,500 mg each day. ?Do not drink alcohol if: ?Your doctor tells you not to drink. ?You are pregnant, may be pregnant, or are planning to become pregnant. ?If you drink alcohol: ?Limit how much you have to: ?0-1 drink a day for women. ?0-2 drinks a day for men. ?Know how much alcohol is in your drink. In the [place] one drink equals one 12 oz bottle of beer (355 mL), one 5 oz glass of wine (148 mL), or one 1? oz glass of hard liquor (44 mL). ?Lifestyle ? ?Work with your doctor to stay at a healthy weight or to lose weight. Ask your doctor what the best weight is for you. ?Get at least 30 minutes of exercise that causes your heart to beat faster (aerobic exercise) most days of the week. This may include walking, swimming, or biking. ?Get at least 30 minutes of exercise that strengthens your muscles (resistance exercise) at least 3 days a week. This may include lifting weights or doing Pilates. ?Do not smoke or use any products that contain nicotine or tobacco. If you need help quitting, ask your doctor. ?Check your blood pressure at home as told by your doctor. ?Keep all follow-up visits. ?Medicines ?Take over-the-counter and prescription medicines   only as told by your doctor. Follow directions carefully. ?Do not skip doses of blood pressure medicine. The medicine does not work as well if you skip doses. Skipping doses also puts you at risk for problems. ?Ask your doctor about side effects or reactions to medicines that you should watch  for. ?Contact a doctor if: ?You think you are having a reaction to the medicine you are taking. ?You have headaches that keep coming back. ?You feel dizzy. ?You have swelling in your ankles. ?You have trouble with your vision. ?Get help right away if: ?You get a very bad headache. ?You start to feel mixed up (confused). ?You feel weak or numb. ?You feel faint. ?You have very bad pain in your: ?Chest. ?Belly (abdomen). ?You vomit more than once. ?You have trouble breathing. ?These symptoms may be an emergency. Get help right away. Call 911. ?Do not wait to see if the symptoms will go away. ?Do not drive yourself to the hospital. ?Summary ?Hypertension is another name for high blood pressure. ?High blood pressure forces your heart to work harder to pump blood. ?For most people, a normal blood pressure is less than 120/80. ?Making healthy choices can help lower blood pressure. If your blood pressure does not get lower with healthy choices, you may need to take medicine. ?This information is not intended to replace advice given to you by your health care provider. Make sure you discuss any questions you have with your health care provider. ?Document Revised: 06/08/2021 Document Reviewed: 06/08/2021 ?Elsevier Patient Education ? 2023 [employer] ? ?

## 2022-01-19 NOTE — Progress Notes (Signed)
Subjective:    Patient ID: Brandi [name], female    DOB: 12-31-1985, 36 y.o.   MRN: [id number]  HPI  Patient presents to clinic today for 30-monthfollow-up of chronic conditions.  HTN: Her BP today is 136/86.  She is not taking Lisinopril HCT as prescribed because she recently found out she is pregnant.  There is no ECG on file.  HLD: Her last LDL was 134, triglycerides 168, 07/2021.  She is not taking Atorvastatin as prescribed because she felt like it was causing headaches.  She tries to consume a low-fat diet.  Prediabetes: Her last A1c was 5.9%, 07/2021.  She is not taking any oral diabetic medication at this time.  She does not check her sugars.  Migraines: These occur once every few months.  She reports they are triggered by work stress and lack of sleep.  She takes Rizatriptan as needed with good relief of symptoms.  She does not follow with neurology.  IBS: Mainly diarrhea.  She takes Imodium as needed.  Colonoscopy from 12/2020 reviewed.  She follows with GI.  GERD: She is not sure what triggers this.  She denies breakthrough on Omeprazole which she takes every other day.  Upper GI from 12/2020 reviewed.  Depression: She is not currently taking any medications for this.  She does not see a therapist.  She denies anxiety, SI/HI.  Chronic Pain Syndrome: Mainly in her back and knees.  She takes Flexeril as needed with good relief of symptoms.  She is not currently seeing orthopedist or pain management for this.  Thrombocytosis: Her last platelet count was 418, 07/2021.  She does not follow with hematology.  Genital Herpes: She denies recent outbreak.  She takes Valacyclovir as needed for outbreaks.  Review of Systems     Past Medical History:  Diagnosis Date   Abnormal Pap smear of cervix 12/2015   ascus/pos- achd   Allergies    Arthritis    Asthma    Chlamydial cervicitis    Depression    pt has past treatment w/ Zoloft - good symptom control, but worsening IBS  symptoms   Genital HSV    GERD (gastroesophageal reflux disease)    Heart murmur    Hypertension    IBS (irritable bowel syndrome)    Trichomoniasis     Current Outpatient Medications  Medication Sig Dispense Refill   Albuterol Sulfate 108 (90 Base) MCG/ACT AEPB Inhale into the lungs.     atorvastatin (LIPITOR) 20 MG tablet Take 1 tablet (20 mg total) by mouth daily. 30 tablet 2   cyclobenzaprine (FLEXERIL) 10 MG tablet Take 1 tablet (10 mg total) by mouth 2 (two) times daily as needed for muscle spasms. 20 tablet 0   fluticasone (FLONASE) 50 MCG/ACT nasal spray Place 1 spray into both nostrils 2 (two) times daily. 16 g 0   hydrocortisone (ANUSOL-HC) 2.5 % rectal cream Place 1 application rectally 2 (two) times daily as needed for hemorrhoids or anal itching. 30 g 0   lisinopril-hydrochlorothiazide (ZESTORETIC) 10-12.5 MG tablet TAKE 1 TABLET BY MOUTH EVERY DAY 30 tablet 3   loratadine (CLARITIN) 10 MG tablet Take 10 mg by mouth daily as needed for allergies.     Multiple Vitamins-Minerals (WOMENS DAILY FORMULA PO) Take by mouth.     omeprazole (PRILOSEC) 20 MG capsule Take 1 capsule (20 mg total) by mouth daily. 90 capsule 1   rizatriptan (MAXALT) 10 MG tablet TAKE 1 TABLET BY MOUTH AS NEEDED FOR MIGRAINE.  MAY REPEAT IN 2 HOURS IF NEEDED 10 tablet 0   valACYclovir (VALTREX) 500 MG tablet 1 TABLET TWICE A DAY X 3 DAYS AS NEEDED FOR OUTBREAKS 30 tablet 0   vitamin B-12 (CYANOCOBALAMIN) 500 MCG tablet Take 500 mcg by mouth daily.     No current facility-administered medications for this visit.    Allergies  Allergen Reactions   Viberzi [Eluxadoline] Itching   Amoxicillin Nausea And Vomiting and Rash    Family History  Problem Relation Age of Onset   Diabetes Mother    Hypertension Mother    Heart disease Father    Heart attack Father    Heart disease Maternal Grandfather    Heart disease Paternal Grandfather    Hypertension Sister    Lung cancer Maternal Grandmother    Lung  cancer Paternal Grandmother    ADD / ADHD Son    Cancer Neg Hx     Social History   Socioeconomic History   Marital status: Single    Spouse name: Not on file   Number of children: 1   Years of education: Not on file   Highest education level: Some college, no degree  Occupational History   Not on file  Tobacco Use   Smoking status: Every Day    Packs/day: 1.00    Years: 12.00    Pack years: 12.00    Types: Cigarettes   Smokeless tobacco: Never  Vaping Use   Vaping Use: Every day   Substances: Nicotine, Flavoring  Substance and Sexual Activity   Alcohol use: Yes    Alcohol/week: 1.0 standard drink    Types: 1 Glasses of wine per week    Comment: rare   Drug use: Yes    Frequency: 7.0 times per week    Types: Marijuana    Comment: once daily at night (pt admits is self-medication for depression/insomnia)   Sexual activity: Yes  Other Topics Concern   Not on file  Social History Narrative   Not on file   Social Determinants of Health   Financial Resource Strain: Not on file  Food Insecurity: Not on file  Transportation Needs: Not on file  Physical Activity: Not on file  Stress: Not on file  Social Connections: Not on file  Intimate Partner Violence: Not on file     Constitutional: Patient reports intermittent headaches.  Denies fever, malaise, fatigue, headache or abrupt weight changes.  HEENT: Denies eye pain, eye redness, ear pain, ringing in the ears, wax buildup, runny nose, nasal congestion, bloody nose, or sore throat. Respiratory: Denies difficulty breathing, shortness of breath, cough or sputum production.   Cardiovascular: Denies chest pain, chest tightness, palpitations or swelling in the hands or feet.  Gastrointestinal: Patient reports intermittent diarrhea.  Denies abdominal pain, bloating, constipation, or blood in the stool.  GU: Pt reports amenorrhea. Denies urgency, frequency, pain with urination, burning sensation, blood in urine, odor or  discharge. Musculoskeletal: Patient reports chronic joint and muscle pain.  Denies decrease in range of motion, difficulty with gait, or joint swelling.  Skin: Denies redness, rashes, lesions or ulcercations.  Neurological: Denies dizziness, difficulty with memory, difficulty with speech or problems with balance and coordination.  Psych: Patient has a history of depression.  Denies anxiety, SI/HI.  No other specific complaints in a complete review of systems (except as listed in HPI above).  Objective:   Physical Exam  BP 138/86 (BP Location: Left Arm, Patient Position: Sitting, Cuff Size: Large)   Pulse 99  Temp (!) 97.3 F (36.3 C) (Temporal)   Wt 248 lb (112.5 kg)   SpO2 100%   BMI 40.03 kg/m   Wt Readings from Last 3 Encounters:  07/20/21 240 lb (108.9 kg)  01/13/21 248 lb 6.4 oz (112.7 kg)  12/29/20 244 lb (110.7 kg)    General: Appears her stated age, obese,  in NAD. Skin: Warm, dry and intact.  HEENT: Head: normal shape and size; Eyes: sclera white, no icterus, conjunctiva pink, PERRLA and EOMs intact;  Neck:  Neck supple, trachea midline. No masses, lumps or thyromegaly present.  Cardiovascular: Normal rate and rhythm. S1,S2 noted.  No murmur, rubs or gallops noted. No JVD or BLE edema.  Pulmonary/Chest: Normal effort and positive vesicular breath sounds. No respiratory distress. No wheezes, rales or ronchi noted.  Abdomen: Soft and nontender. Normal bowel sounds.  Musculoskeletal: No difficulty with gait.  Neurological: Alert and oriented.  Psychiatric: Mood and affect normal. Behavior is normal. Judgment and thought content normal.    BMET    Component Value Date/Time   NA 140 07/20/2021 1115   K 4.1 07/20/2021 1115   CL 105 07/20/2021 1115   CO2 24 07/20/2021 1115   GLUCOSE 100 (H) 07/20/2021 1115   BUN 5 (L) 07/20/2021 1115   CREATININE 0.72 07/20/2021 1115   CALCIUM 9.6 07/20/2021 1115   GFRNONAA 112 05/30/2020 1051   GFRAA 130 05/30/2020 1051     Lipid Panel     Component Value Date/Time   CHOL 206 (H) 07/20/2021 1115   TRIG 168 (H) 07/20/2021 1115   HDL 41 (L) 07/20/2021 1115   CHOLHDL 5.0 (H) 07/20/2021 1115   LDLCALC 134 (H) 07/20/2021 1115    CBC    Component Value Date/Time   WBC 9.1 07/20/2021 1115   RBC 4.73 07/20/2021 1115   HGB 14.8 07/20/2021 1115   HCT 44.6 07/20/2021 1115   PLT 418 (H) 07/20/2021 1115   MCV 94.3 07/20/2021 1115   MCH 31.3 07/20/2021 1115   MCHC 33.2 07/20/2021 1115   RDW 11.6 07/20/2021 1115   LYMPHSABS 2,970 05/30/2020 1051   EOSABS 210 05/30/2020 1051   BASOSABS 40 05/30/2020 1051    Hgb A1C Lab Results  Component Value Date   HGBA1C 5.9 (H) 07/20/2021           Assessment & Plan:   Webb Silversmith, NP

## 2022-01-19 NOTE — Assessment & Plan Note (Signed)
CBC today.  

## 2022-01-19 NOTE — Assessment & Plan Note (Signed)
Continue Imodium OTC as needed Work accommodation form will be filled out and faxed per her request

## 2022-01-19 NOTE — Assessment & Plan Note (Signed)
She is not interested in treatment at this time Support offered

## 2022-01-19 NOTE — Assessment & Plan Note (Signed)
A1c today Discussed risk of gestational diabetes given that she is pregnant Encourage low-carb diet and exercise for weight loss

## 2022-01-20 LAB — CBC
HCT: 39.5 % (ref 35.0–45.0)
Hemoglobin: 13.2 g/dL (ref 11.7–15.5)
MCH: 30.6 pg (ref 27.0–33.0)
MCHC: 33.4 g/dL (ref 32.0–36.0)
MCV: 91.6 fL (ref 80.0–100.0)
MPV: 11.5 fL (ref 7.5–12.5)
Platelets: 380 10*3/uL (ref 140–400)
RBC: 4.31 10*6/uL (ref 3.80–5.10)
RDW: 12.4 % (ref 11.0–15.0)
WBC: 10.9 10*3/uL — ABNORMAL HIGH (ref 3.8–10.8)

## 2022-01-20 LAB — LIPID PANEL
Cholesterol: 190 mg/dL (ref <200)
HDL: 43 mg/dL — ABNORMAL LOW (ref >=50)
LDL Cholesterol (Calc): 114 mg/dL (calc) — ABNORMAL HIGH
Non-HDL Cholesterol (Calc): 147 mg/dL (calc) — ABNORMAL HIGH (ref <130)
Total CHOL/HDL Ratio: 4.4 (calc) (ref <5.0)
Triglycerides: 212 mg/dL — ABNORMAL HIGH (ref <150)

## 2022-01-20 LAB — COMPLETE METABOLIC PANEL WITH GFR
AG Ratio: 1.6 (calc) (ref 1.0–2.5)
ALT: 16 U/L (ref 6–29)
AST: 13 U/L (ref 10–30)
Albumin: 4 g/dL (ref 3.6–5.1)
Alkaline phosphatase (APISO): 54 U/L (ref 31–125)
BUN/Creatinine Ratio: 10 (calc) (ref 6–22)
BUN: 6 mg/dL — ABNORMAL LOW (ref 7–25)
CO2: 21 mmol/L (ref 20–32)
Calcium: 9 mg/dL (ref 8.6–10.2)
Chloride: 107 mmol/L (ref 98–110)
Creat: 0.58 mg/dL (ref 0.50–0.97)
Globulin: 2.5 g/dL (calc) (ref 1.9–3.7)
Glucose, Bld: 141 mg/dL — ABNORMAL HIGH (ref 65–139)
Potassium: 3.8 mmol/L (ref 3.5–5.3)
Sodium: 138 mmol/L (ref 135–146)
Total Bilirubin: 0.3 mg/dL (ref 0.2–1.2)
Total Protein: 6.5 g/dL (ref 6.1–8.1)
eGFR: 121 mL/min/{1.73_m2} (ref >=60)

## 2022-01-20 LAB — HEMOGLOBIN A1C
Hgb A1c MFr Bld: 5.9 % of total Hgb — ABNORMAL HIGH (ref <5.7)
Mean Plasma Glucose: 123 mg/dL
eAG (mmol/L): 6.8 mmol/L

## 2022-01-23 ENCOUNTER — Encounter: Payer: Self-pay | Admitting: Internal Medicine

## 2022-01-31 ENCOUNTER — Ambulatory Visit (INDEPENDENT_AMBULATORY_CARE_PROVIDER_SITE_OTHER): Payer: BC Managed Care – PPO | Admitting: *Deleted

## 2022-01-31 ENCOUNTER — Other Ambulatory Visit: Payer: Self-pay | Admitting: *Deleted

## 2022-01-31 ENCOUNTER — Ambulatory Visit (INDEPENDENT_AMBULATORY_CARE_PROVIDER_SITE_OTHER): Payer: BC Managed Care – PPO

## 2022-01-31 VITALS — BP 140/83 | HR 103 | Wt 250.0 lb

## 2022-01-31 DIAGNOSIS — O3680X0 Pregnancy with inconclusive fetal viability, not applicable or unspecified: Secondary | ICD-10-CM | POA: Diagnosis not present

## 2022-01-31 DIAGNOSIS — Z3A08 8 weeks gestation of pregnancy: Secondary | ICD-10-CM

## 2022-01-31 DIAGNOSIS — O0990 Supervision of high risk pregnancy, unspecified, unspecified trimester: Secondary | ICD-10-CM | POA: Insufficient documentation

## 2022-01-31 DIAGNOSIS — Z3481 Encounter for supervision of other normal pregnancy, first trimester: Secondary | ICD-10-CM

## 2022-01-31 MED ORDER — NIFEDIPINE ER OSMOTIC RELEASE 30 MG PO TB24: 30.0000 mg | ORAL_TABLET | Freq: Every day | ORAL | 3 refills | Status: DC

## 2022-01-31 NOTE — Progress Notes (Signed)
New OB Intake  I explained I am completing New OB Intake today. We discussed her EDD of 09/12/22 that is based on today's scan. LMP of Approx 11/30/21. Pt is G2/P1. I reviewed her allergies, medications, Medical/Surgical/OB history, and appropriate screenings.  .   Patient Active Problem List   Diagnosis Date Noted   Prediabetes 01/19/2022   Mixed hyperlipidemia 07/20/2021   Thrombocytosis 07/20/2021   Morbid obesity ([place]) 07/20/2021   Genital herpes 02/03/2021   Chronic lower back pain 05/30/2020   Migraine 05/30/2020   Chronic pain of both knees 05/30/2020   Irritable bowel syndrome with diarrhea 10/22/2018   Essential hypertension 10/22/2018   Recurrent major depressive disorder, in partial remission ([place]) 10/22/2018   Gastroesophageal reflux disease 10/22/2018    Concerns addressed today  Delivery Plans:  Plans to deliver at [facility].   Blood Pressure Cuff  Pt has her own BP cuff and will check her BP weekly    Anatomy US Explained first scheduled [place] will be around 19 weeks.   Labs Discussed Johnsie Cancel genetic screening with patient. Would like both Panorama and Horizon drawn at new OB visit. Routine prenatal labs needed.    Placed OB Box on problem list and updated   Patient informed that the ultrasound is considered a limited obstetric ultrasound and is not intended to be a complete ultrasound exam.  Patient also informed that the ultrasound is not being completed with the intent of assessing for fetal or placental anomalies or any pelvic abnormalities. Explained that the purpose of today's ultrasound is to assess for dating and fetal heart rate.  Patient acknowledges the purpose of the exam and the limitations of the study.      First visit review I reviewed new OB appt with pt. I explained she will have a pelvic exam, ob bloodwork with genetic screening, and PAP smear. Explained pt will be seen by Dr Ilda Basset at first visit.   Crosby Oyster, RN 01/31/2022  11:26 AM

## 2022-01-31 NOTE — Progress Notes (Signed)
Spoke with Dr Kennon Rounds regarding pt BP today. She will have pt start procardia '30mg'$  daily.

## 2022-02-08 DIAGNOSIS — F331 Major depressive disorder, recurrent, moderate: Secondary | ICD-10-CM | POA: Diagnosis not present

## 2022-02-22 ENCOUNTER — Ambulatory Visit (INDEPENDENT_AMBULATORY_CARE_PROVIDER_SITE_OTHER): Payer: BC Managed Care – PPO | Admitting: Obstetrics and Gynecology

## 2022-02-22 ENCOUNTER — Encounter: Payer: Self-pay | Admitting: Obstetrics and Gynecology

## 2022-02-22 ENCOUNTER — Other Ambulatory Visit (HOSPITAL_COMMUNITY)
Admission: RE | Admit: 2022-02-22 | Discharge: 2022-02-22 | Disposition: A | Discharge status: Home / Self Care | Payer: BC Managed Care – PPO | Source: Ambulatory Visit | Attending: Obstetrics and Gynecology | Admitting: Obstetrics and Gynecology

## 2022-02-22 VITALS — BP 145/86 | HR 90 | Wt 247.0 lb

## 2022-02-22 DIAGNOSIS — Z713 Dietary counseling and surveillance: Secondary | ICD-10-CM | POA: Diagnosis not present

## 2022-02-22 DIAGNOSIS — O10911 Unspecified pre-existing hypertension complicating pregnancy, first trimester: Secondary | ICD-10-CM | POA: Insufficient documentation

## 2022-02-22 DIAGNOSIS — Z31430 Encounter of female for testing for genetic disease carrier status for procreative management: Secondary | ICD-10-CM | POA: Diagnosis not present

## 2022-02-22 DIAGNOSIS — O26891 Other specified pregnancy related conditions, first trimester: Secondary | ICD-10-CM | POA: Diagnosis not present

## 2022-02-22 DIAGNOSIS — Z3A11 11 weeks gestation of pregnancy: Secondary | ICD-10-CM

## 2022-02-22 DIAGNOSIS — O99211 Obesity complicating pregnancy, first trimester: Secondary | ICD-10-CM | POA: Insufficient documentation

## 2022-02-22 DIAGNOSIS — Z8669 Personal history of other diseases of the nervous system and sense organs: Secondary | ICD-10-CM | POA: Insufficient documentation

## 2022-02-22 DIAGNOSIS — E6609 Other obesity due to excess calories: Secondary | ICD-10-CM | POA: Insufficient documentation

## 2022-02-22 DIAGNOSIS — O09511 Supervision of elderly primigravida, first trimester: Secondary | ICD-10-CM

## 2022-02-22 DIAGNOSIS — A6000 Herpesviral infection of urogenital system, unspecified: Secondary | ICD-10-CM | POA: Diagnosis not present

## 2022-02-22 DIAGNOSIS — O10913 Unspecified pre-existing hypertension complicating pregnancy, third trimester: Secondary | ICD-10-CM | POA: Diagnosis not present

## 2022-02-22 DIAGNOSIS — O09891 Supervision of other high risk pregnancies, first trimester: Secondary | ICD-10-CM | POA: Insufficient documentation

## 2022-02-22 DIAGNOSIS — O09529 Supervision of elderly multigravida, unspecified trimester: Secondary | ICD-10-CM | POA: Insufficient documentation

## 2022-02-22 DIAGNOSIS — O98511 Other viral diseases complicating pregnancy, first trimester: Secondary | ICD-10-CM | POA: Diagnosis not present

## 2022-02-22 DIAGNOSIS — Z6841 Body Mass Index (BMI) 40.0 and over, adult: Secondary | ICD-10-CM | POA: Insufficient documentation

## 2022-02-22 DIAGNOSIS — O10919 Unspecified pre-existing hypertension complicating pregnancy, unspecified trimester: Secondary | ICD-10-CM | POA: Insufficient documentation

## 2022-02-22 DIAGNOSIS — R519 Headache, unspecified: Secondary | ICD-10-CM | POA: Insufficient documentation

## 2022-02-22 DIAGNOSIS — O09521 Supervision of elderly multigravida, first trimester: Secondary | ICD-10-CM

## 2022-02-22 DIAGNOSIS — R7303 Prediabetes: Secondary | ICD-10-CM | POA: Diagnosis not present

## 2022-02-22 DIAGNOSIS — O09523 Supervision of elderly multigravida, third trimester: Secondary | ICD-10-CM | POA: Insufficient documentation

## 2022-02-22 DIAGNOSIS — Z3A31 31 weeks gestation of pregnancy: Secondary | ICD-10-CM | POA: Diagnosis not present

## 2022-02-22 DIAGNOSIS — O4693 Antepartum hemorrhage, unspecified, third trimester: Secondary | ICD-10-CM | POA: Diagnosis not present

## 2022-02-22 DIAGNOSIS — Z3491 Encounter for supervision of normal pregnancy, unspecified, first trimester: Secondary | ICD-10-CM | POA: Diagnosis not present

## 2022-02-22 DIAGNOSIS — O99013 Anemia complicating pregnancy, third trimester: Secondary | ICD-10-CM | POA: Diagnosis not present

## 2022-02-22 DIAGNOSIS — O0991 Supervision of high risk pregnancy, unspecified, first trimester: Secondary | ICD-10-CM | POA: Diagnosis not present

## 2022-02-22 DIAGNOSIS — O4403 Placenta previa specified as without hemorrhage, third trimester: Secondary | ICD-10-CM | POA: Diagnosis not present

## 2022-02-22 DIAGNOSIS — O24410 Gestational diabetes mellitus in pregnancy, diet controlled: Secondary | ICD-10-CM | POA: Diagnosis not present

## 2022-02-22 DIAGNOSIS — O24420 Gestational diabetes mellitus in childbirth, diet controlled: Secondary | ICD-10-CM | POA: Diagnosis not present

## 2022-02-22 DIAGNOSIS — O0990 Supervision of high risk pregnancy, unspecified, unspecified trimester: Secondary | ICD-10-CM

## 2022-02-22 MED ORDER — MAGNESIUM OXIDE 400 240 MG PO PACK
1.0000 | PACK | Freq: Every day | ORAL | 1 refills | Status: DC | PRN
Start: 2022-02-22 — End: 2022-08-08

## 2022-02-22 MED ORDER — LABETALOL HCL 200 MG PO TABS: 200.0000 mg | ORAL_TABLET | Freq: Two times a day (BID) | ORAL | 3 refills | Status: DC

## 2022-02-22 NOTE — Patient Instructions (Signed)
Stop the procardia blood pressure pill and switch to labetalol twice a day

## 2022-02-23 LAB — CBC/D/PLT+RPR+RH+ABO+RUBIGG...
Antibody Screen: NEGATIVE
Basophils Absolute: 0 10*3/uL (ref 0.0–0.2)
Basos: 0 %
EOS (ABSOLUTE): 0.1 10*3/uL (ref 0.0–0.4)
Eos: 1 %
HCV Ab: NONREACTIVE
HIV Screen 4th Generation wRfx: NONREACTIVE
Hematocrit: 37.6 % (ref 34.0–46.6)
Hemoglobin: 13.1 g/dL (ref 11.1–15.9)
Hepatitis B Surface Ag: NEGATIVE
Immature Grans (Abs): 0 10*3/uL (ref 0.0–0.1)
Immature Granulocytes: 0 %
Lymphocytes Absolute: 2.1 10*3/uL (ref 0.7–3.1)
Lymphs: 19 %
MCH: 30.5 pg (ref 26.6–33.0)
MCHC: 34.8 g/dL (ref 31.5–35.7)
MCV: 87 fL (ref 79–97)
Monocytes Absolute: 0.5 10*3/uL (ref 0.1–0.9)
Monocytes: 5 %
Neutrophils Absolute: 8.2 10*3/uL — ABNORMAL HIGH (ref 1.4–7.0)
Neutrophils: 75 %
Platelets: 313 10*3/uL (ref 150–450)
RBC: 4.3 x10E6/uL (ref 3.77–5.28)
RDW: 12.3 % (ref 11.7–15.4)
RPR Ser Ql: NONREACTIVE
Rh Factor: POSITIVE
Rubella Antibodies, IGG: <0.9 index — ABNORMAL LOW (ref >0.99)
WBC: 11 10*3/uL — ABNORMAL HIGH (ref 3.4–10.8)

## 2022-02-23 LAB — TSH RFX ON ABNORMAL TO FREE T4: TSH: 1.38 u[IU]/mL (ref 0.450–4.500)

## 2022-02-23 LAB — PROTEIN / CREATININE RATIO, URINE
Creatinine, Urine: 95 mg/dL
Protein, Ur: 8.7 mg/dL
Protein/Creat Ratio: 92 mg/g creat (ref 0–200)

## 2022-02-23 LAB — HCV INTERPRETATION

## 2022-02-24 LAB — URINE CULTURE, OB REFLEX

## 2022-02-24 LAB — CULTURE, OB URINE

## 2022-02-26 ENCOUNTER — Encounter: Payer: Self-pay | Admitting: Internal Medicine

## 2022-02-26 ENCOUNTER — Encounter: Payer: Self-pay | Admitting: Obstetrics and Gynecology

## 2022-02-26 DIAGNOSIS — K219 Gastro-esophageal reflux disease without esophagitis: Secondary | ICD-10-CM

## 2022-02-26 NOTE — Telephone Encounter (Signed)
Please review.  It is okay for her to take this while pregnant.   Thanks,   -Vernona Rieger

## 2022-02-27 ENCOUNTER — Encounter: Payer: BC Managed Care – PPO | Admitting: Obstetrics

## 2022-02-27 LAB — CYTOLOGY - PAP
Chlamydia: NEGATIVE
Comment: NEGATIVE
Comment: NEGATIVE
Comment: NEGATIVE
Comment: NEGATIVE
Comment: NORMAL
HPV 16: NEGATIVE
HPV 18 / 45: POSITIVE — AB
High risk HPV: POSITIVE — AB
Neisseria Gonorrhea: NEGATIVE

## 2022-02-27 MED ORDER — OMEPRAZOLE 20 MG PO CPDR: 20.0000 mg | DELAYED_RELEASE_CAPSULE | Freq: Every day | ORAL | 1 refills | Status: DC

## 2022-02-28 ENCOUNTER — Encounter: Payer: Self-pay | Admitting: Obstetrics and Gynecology

## 2022-02-28 DIAGNOSIS — R87612 Low grade squamous intraepithelial lesion on cytologic smear of cervix (LGSIL): Secondary | ICD-10-CM | POA: Insufficient documentation

## 2022-03-01 ENCOUNTER — Ambulatory Visit (INDEPENDENT_AMBULATORY_CARE_PROVIDER_SITE_OTHER): Payer: BC Managed Care – PPO | Admitting: *Deleted

## 2022-03-01 VITALS — BP 119/82 | HR 80

## 2022-03-01 DIAGNOSIS — O09511 Supervision of elderly primigravida, first trimester: Secondary | ICD-10-CM

## 2022-03-01 LAB — PANORAMA PRENATAL TEST FULL PANEL:PANORAMA TEST PLUS 5 ADDITIONAL MICRODELETIONS: FETAL FRACTION: 3.4

## 2022-03-01 LAB — HORIZON 4 (SMA, CF, FRAGILE X, DMD)
CYSTIC FIBROSIS: NEGATIVE
DUCHENNE/BECKER MUSCULAR DYSTROPHY: NEGATIVE
FRAGILE X SYNDROME: NEGATIVE
REPORT SUMMARY: NEGATIVE
SPINAL MUSCULAR ATROPHY: NEGATIVE

## 2022-03-01 NOTE — Progress Notes (Cosign Needed)
Subjective:  Brandi [name] is a 36 y.o. female here for BP check and an early 18.  Hypertension ROS: Patient denies any headaches, visual symptoms, RUQ/epigastric pain or other concerning symptoms.  Objective:  BP 119/82   Pulse 80   LMP 11/30/2021   Appearance alert, well appearing, and in no distress. General exam BP noted to be improved today in office.    Assessment:   Blood Pressure well controlled.   Plan:  Follow up at next [place], RN .

## 2022-03-01 NOTE — Progress Notes (Signed)
Patient was assessed and managed by nursing staff during this encounter. I have reviewed the chart and agree with the documentation and plan. I have also made any necessary editorial changes.  Aletha Halim, MD 03/01/2022 2:36 PM

## 2022-03-02 LAB — GLUCOSE TOLERANCE, 2 HOURS W/ 1HR
Glucose, 1 hour: 192 mg/dL — ABNORMAL HIGH (ref 70–179)
Glucose, 2 hour: 160 mg/dL — ABNORMAL HIGH (ref 70–152)
Glucose, Fasting: 100 mg/dL — ABNORMAL HIGH (ref 70–91)

## 2022-03-03 ENCOUNTER — Encounter: Payer: Self-pay | Admitting: Obstetrics and Gynecology

## 2022-03-03 DIAGNOSIS — O24419 Gestational diabetes mellitus in pregnancy, unspecified control: Secondary | ICD-10-CM | POA: Insufficient documentation

## 2022-03-03 DIAGNOSIS — O24420 Gestational diabetes mellitus in childbirth, diet controlled: Secondary | ICD-10-CM | POA: Insufficient documentation

## 2022-03-05 ENCOUNTER — Other Ambulatory Visit: Payer: Self-pay | Admitting: *Deleted

## 2022-03-05 DIAGNOSIS — O24410 Gestational diabetes mellitus in pregnancy, diet controlled: Secondary | ICD-10-CM

## 2022-03-05 MED ORDER — ACCU-CHEK GUIDE W/DEVICE KIT: 1.0000 | PACK | Freq: Four times a day (QID) | 0 refills | Status: DC

## 2022-03-05 MED ORDER — ACCU-CHEK GUIDE VI STRP: ORAL_STRIP | 12 refills | Status: DC

## 2022-03-05 MED ORDER — ACCU-CHEK SOFTCLIX LANCETS MISC: 1.0000 | Freq: Four times a day (QID) | 12 refills | Status: DC

## 2022-03-15 ENCOUNTER — Ambulatory Visit (INDEPENDENT_AMBULATORY_CARE_PROVIDER_SITE_OTHER): Payer: BC Managed Care – PPO | Admitting: Advanced Practice Midwife

## 2022-03-15 VITALS — BP 130/77 | HR 103 | Wt 245.8 lb

## 2022-03-15 DIAGNOSIS — R87612 Low grade squamous intraepithelial lesion on cytologic smear of cervix (LGSIL): Secondary | ICD-10-CM

## 2022-03-15 DIAGNOSIS — O0990 Supervision of high risk pregnancy, unspecified, unspecified trimester: Secondary | ICD-10-CM

## 2022-03-15 DIAGNOSIS — O10912 Unspecified pre-existing hypertension complicating pregnancy, second trimester: Secondary | ICD-10-CM

## 2022-03-15 DIAGNOSIS — O24410 Gestational diabetes mellitus in pregnancy, diet controlled: Secondary | ICD-10-CM

## 2022-03-15 DIAGNOSIS — O10919 Unspecified pre-existing hypertension complicating pregnancy, unspecified trimester: Secondary | ICD-10-CM

## 2022-03-15 DIAGNOSIS — Z3A14 14 weeks gestation of pregnancy: Secondary | ICD-10-CM

## 2022-03-15 DIAGNOSIS — O0992 Supervision of high risk pregnancy, unspecified, second trimester: Secondary | ICD-10-CM

## 2022-03-15 NOTE — Progress Notes (Signed)
Pt presents for ROB at [redacted]w[redacted]d   GDM reports FBS readings 120s-130s, but post prandial readings are less than 120.   Has not started ASA

## 2022-03-16 NOTE — Progress Notes (Signed)
PRENATAL VISIT NOTE  Subjective:  Brandi [name] is a 36 y.o. G2P1001 at 31w2dbeing seen today for ongoing prenatal care.  She is currently monitored for the following issues for this high-risk pregnancy and has Irritable bowel syndrome with diarrhea; Essential hypertension; Recurrent major depressive disorder, in partial remission ([place]; Gastroesophageal reflux disease; Chronic lower back pain; History of migraine; Chronic pain of both knees; Genital herpes; Mixed hyperlipidemia; Morbid obesity ([place]; Prediabetes; Supervision of high risk pregnancy, antepartum; BMI 40.0-44.9, adult ([place]; Chronic hypertension affecting pregnancy; Multigravida of advanced maternal age in first trimester; Medication exposure during first trimester of pregnancy; LGSIL on Pap smear of cervix; and GDM diagnosed at 12 weeks on their problem list.  Patient reports no complaints.  Contractions: Not present. Vag. Bleeding: None.  Denies leaking of fluid.   Patient states she is checking her blood sugar four times her day as previously advised. Her postprandial readings are within range but her fasting readings are consistently elevated. She has not taken the Diabetes Education class. She states she is "way behind on voicemails" and believes she missed the scheduling phone call.   Patient and her partner live with patient's parents, who typically do the grocery shopping and   The following portions of the patient's history were reviewed and updated as appropriate: allergies, current medications, past family history, past medical history, past social history, past surgical history and problem list. Problem list updated.  Objective:   Vitals:   03/15/22 1548  BP: 130/77  Pulse: (!) 103  Weight: 245 lb 12.8 oz (111.5 kg)    Fetal Status:           General:  Alert, oriented and cooperative. Patient is in no acute distress.  Skin: Skin is warm and dry. No rash noted.   Cardiovascular: Normal heart rate noted   Respiratory: Normal respiratory effort, no problems with respiration noted  Abdomen: Soft, gravid, appropriate for gestational age.  Pain/Pressure: Absent     Pelvic: Cervical exam deferred        Extremities: Normal range of motion.     Mental Status: Normal mood and affect. Normal behavior. Normal judgment and thought content.   Assessment and Plan:  Pregnancy: G2P1001 at 174w2d1. Supervision of high risk pregnancy, antepartum - Very active fetus, FHR 145 bpm with ultrasound - Confirmed with patient she is not a candidate for waterbirth but there are many other patient-focused interventions we can offer during her labor course  2. Diet controlled gestational diabetes mellitus (GDM) in first trimester - Checked sugars but has not taken class - Encouraged to return scheduling voicemail with Diabetes Educator ASAP - Given contact phone for scheduling diabetes course - Discussed elevated fasting sugar reading is typically a function of dinner food selection. Advised clean protein, provided options - If ongoing difficulty with food options in home, may need to consider separate meal time or meal planning to avoid favorite comfort foods e.g. mac n cheese  3. Morbid obesity (HCC) - TWG 2 lbs  4. Chronic hypertension affecting pregnancy - Begin daily bASA   5. LGSIL on Pap smear of cervix - Colpo next visit  6. [redacted] weeks gestation of pregnancy   Preterm labor symptoms and general obstetric precautions including but not limited to vaginal bleeding, contractions, leaking of fluid and fetal movement were reviewed in detail with the patient. Please refer to After Visit Summary for other counseling recommendations.   Patient to scheduled Diabetes Education course ASAP. If unable, should  RTC in two weeks for review of CBG log with MD  Future Appointments  Date Time Provider [place]  04/17/2022  1:50 PM Aletha Halim, MD [facility]  04/18/2022  2:30 PM [facility] NURSE  [facility]  04/18/2022  2:45 PM [facility] US4 [facility]  05/17/2022  3:30 PM Aletha Halim, MD [facility]  07/24/2022  2:00 PM Baity, Coralie Keens, NP [place], CNM

## 2022-03-21 ENCOUNTER — Ambulatory Visit: Payer: BC Managed Care – PPO

## 2022-03-21 DIAGNOSIS — F331 Major depressive disorder, recurrent, moderate: Secondary | ICD-10-CM | POA: Diagnosis not present

## 2022-03-22 ENCOUNTER — Encounter: Payer: BC Managed Care – PPO | Admitting: Obstetrics and Gynecology

## 2022-03-28 ENCOUNTER — Encounter: Payer: Self-pay | Admitting: Registered"

## 2022-03-28 ENCOUNTER — Encounter: Payer: BC Managed Care – PPO | Attending: Obstetrics and Gynecology | Admitting: Registered"

## 2022-03-28 DIAGNOSIS — O24410 Gestational diabetes mellitus in pregnancy, diet controlled: Secondary | ICD-10-CM | POA: Diagnosis not present

## 2022-03-28 DIAGNOSIS — Z713 Dietary counseling and surveillance: Secondary | ICD-10-CM | POA: Insufficient documentation

## 2022-03-28 DIAGNOSIS — O24419 Gestational diabetes mellitus in pregnancy, unspecified control: Secondary | ICD-10-CM

## 2022-03-28 NOTE — Progress Notes (Signed)
Patient was seen on 03/28/22 for Gestational Diabetes self-management class at [facility]. The following learning objectives were met by the patient during this course:  States the definition of Gestational Diabetes States why dietary management is important in controlling blood glucose Describes the effects each nutrient has on blood glucose levels Demonstrates ability to create a balanced meal plan Demonstrates carbohydrate counting  States when to check blood glucose levels Demonstrates proper blood glucose monitoring techniques States the effect of stress and exercise on blood glucose levels States the importance of limiting caffeine and abstaining from alcohol and smoking  Blood glucose monitor given: None.  Patient has meter and checking blood sugar prior to class.  Patient instructed to monitor glucose levels: FBS: 60 - <95; 1 hour: <140; 2 hour: <120  Patient received handouts: Nutrition Diabetes and Pregnancy, including carb counting list  Patient will be seen for follow-up as needed.

## 2022-04-06 ENCOUNTER — Encounter: Payer: Self-pay | Admitting: Obstetrics and Gynecology

## 2022-04-17 ENCOUNTER — Ambulatory Visit (INDEPENDENT_AMBULATORY_CARE_PROVIDER_SITE_OTHER): Payer: BC Managed Care – PPO | Admitting: Obstetrics and Gynecology

## 2022-04-17 ENCOUNTER — Encounter: Payer: Self-pay | Admitting: Obstetrics and Gynecology

## 2022-04-17 VITALS — BP 121/75 | HR 92 | Wt 245.0 lb

## 2022-04-17 DIAGNOSIS — R87612 Low grade squamous intraepithelial lesion on cytologic smear of cervix (LGSIL): Secondary | ICD-10-CM

## 2022-04-17 DIAGNOSIS — A6004 Herpesviral vulvovaginitis: Secondary | ICD-10-CM

## 2022-04-17 DIAGNOSIS — Z6841 Body Mass Index (BMI) 40.0 and over, adult: Secondary | ICD-10-CM

## 2022-04-17 DIAGNOSIS — O10919 Unspecified pre-existing hypertension complicating pregnancy, unspecified trimester: Secondary | ICD-10-CM

## 2022-04-17 DIAGNOSIS — O24410 Gestational diabetes mellitus in pregnancy, diet controlled: Secondary | ICD-10-CM

## 2022-04-17 DIAGNOSIS — O0990 Supervision of high risk pregnancy, unspecified, unspecified trimester: Secondary | ICD-10-CM

## 2022-04-17 HISTORY — PX: COLPOSCOPY: PRO47

## 2022-04-17 MED ORDER — ASPIRIN 81 MG PO TBEC: 81.0000 mg | DELAYED_RELEASE_TABLET | Freq: Every day | ORAL | 1 refills | Status: DC

## 2022-04-17 NOTE — Progress Notes (Signed)
ROB 16w6dColpo.  CC: Nipple pain

## 2022-04-17 NOTE — Procedures (Signed)
Colposcopy Procedure Note  Pre-operative Diagnosis: Pregnancy at 18/6. 02/22/22 LGSIL/hpv  Post-operative Diagnosis: Same  Procedure Details  The risks (including infection, bleeding, pain) and benefits of the procedure were explained to the patient and written informed consent was obtained.  The patient was placed in the dorsal lithotomy position. A Graves was speculum inserted in the vagina, and the cervix was visualized.  Acetic acid staining was done and the cervix was viewed.  Biopsy not done. There was no bleeding after procedure.   Findings: Cervix closed/long. Mild AWE changes  Adequate: Yes  Specimens: no  Condition: Stable  Complications: None  Plan: Discussed with her recommend repeat colpo 6-12 weeks postpartum  Durene Romans MD Attending Center for [facility])

## 2022-04-17 NOTE — Progress Notes (Addendum)
   PRENATAL VISIT NOTE  Subjective:  Brandi [name] is a 36 y.o. G2P1001 at 63w6dbeing seen today for ongoing prenatal care.  She is currently monitored for the following issues for this high-risk pregnancy and has Irritable bowel syndrome with diarrhea; Essential hypertension; Recurrent major depressive disorder, in partial remission ([place]; Gastroesophageal reflux disease; Chronic lower back pain; History of migraine; Chronic pain of both knees; Genital herpes; Mixed hyperlipidemia; Morbid obesity ([place]; Prediabetes; Supervision of high risk pregnancy, antepartum; BMI 40.0-44.9, adult ([place]; Chronic hypertension affecting pregnancy; Multigravida of advanced maternal age in first trimester; Medication exposure during first trimester of pregnancy; LGSIL on Pap smear of cervix; and GDM diagnosed at 12 weeks on their problem list.  Patient reports no complaints except b/l nipple discomfort during the day, no d/c or lumps/bumps.   Contractions: Not present. Vag. Bleeding: None.  Movement: Present. Denies leaking of fluid.   The following portions of the patient's history were reviewed and updated as appropriate: allergies, current medications, past family history, past medical history, past social history, past surgical history and problem list.   Objective:   Vitals:   04/17/22 1402  BP: 121/75  Pulse: 92  Weight: 245 lb (111.1 kg)    Fetal Status: Fetal Heart Rate (bpm): 152   Movement: Present     General:  Alert, oriented and cooperative. Patient is in no acute distress.  Skin: Skin is warm and dry. No rash noted.   Cardiovascular: Normal heart rate noted  Respiratory: Normal respiratory effort, no problems with respiration noted  Abdomen: Soft, gravid, appropriate for gestational age.  Pain/Pressure: Present     Pelvic: Cervical exam performed in the presence of a chaperone        Extremities: Normal range of motion.     Mental Status: Normal mood and affect. Normal behavior. Normal  judgment and thought content.  Breast: normal bilaterally. Bilateral nipple piercings Assessment and Plan:  Pregnancy: G2P1001 at 146w6d. LGSIL on Pap smear of cervix Colpo c/w cin1. No biopsies done. D/w her recommend repeat 6-12wks PP  2. Chronic hypertension affecting pregnancy Doing well on labetalol 100 bid Pt confirms on low dose asa.   3. Diet controlled gestational diabetes mellitus (GDM) in second trimester Normal am fasting and 2 hour post prandials  4. Herpes simplex vulvovaginitis Ppx at 34-36wks  5. Supervision of high risk pregnancy, antepartum F/u anatomy u/s tomorrow. Nipple pads recommended  6. Morbid obesity ([place]boro[place] stable  7. BMI 40.0-44.9, adult ([place] Preterm labor symptoms and general obstetric precautions including but not limited to vaginal bleeding, contractions, leaking of fluid and fetal movement were reviewed in detail with the patient. Please refer to After Visit Summary for other counseling recommendations.   Return in about 3 weeks (around 05/08/2022) for in person, md or app, high risk ob.  Future Appointments  Date Time Provider [place]8/16/2023  2:30 PM [facility]20[facility]  2:45 PM [facility] US[facility]/14/2023  3:30 PM PiAletha HalimMD [facility]  07/24/2022  2:00 PM Baity, ReCoralie KeensNP [facility]    ChAletha HalimMD

## 2022-04-18 ENCOUNTER — Ambulatory Visit: Payer: BC Managed Care – PPO | Admitting: *Deleted

## 2022-04-18 ENCOUNTER — Other Ambulatory Visit: Payer: Self-pay | Admitting: *Deleted

## 2022-04-18 ENCOUNTER — Ambulatory Visit: Payer: BC Managed Care – PPO | Attending: Obstetrics and Gynecology

## 2022-04-18 VITALS — BP 137/75 | HR 79

## 2022-04-18 DIAGNOSIS — O24419 Gestational diabetes mellitus in pregnancy, unspecified control: Secondary | ICD-10-CM | POA: Insufficient documentation

## 2022-04-18 DIAGNOSIS — O10112 Pre-existing hypertensive heart disease complicating pregnancy, second trimester: Secondary | ICD-10-CM | POA: Diagnosis not present

## 2022-04-18 DIAGNOSIS — Z3A11 11 weeks gestation of pregnancy: Secondary | ICD-10-CM | POA: Diagnosis not present

## 2022-04-18 DIAGNOSIS — O4402 Placenta previa specified as without hemorrhage, second trimester: Secondary | ICD-10-CM

## 2022-04-18 DIAGNOSIS — O09522 Supervision of elderly multigravida, second trimester: Secondary | ICD-10-CM

## 2022-04-18 DIAGNOSIS — O10912 Unspecified pre-existing hypertension complicating pregnancy, second trimester: Secondary | ICD-10-CM

## 2022-04-18 DIAGNOSIS — O4412 Placenta previa with hemorrhage, second trimester: Secondary | ICD-10-CM | POA: Diagnosis not present

## 2022-04-18 DIAGNOSIS — Z3A19 19 weeks gestation of pregnancy: Secondary | ICD-10-CM | POA: Diagnosis not present

## 2022-04-18 DIAGNOSIS — O0990 Supervision of high risk pregnancy, unspecified, unspecified trimester: Secondary | ICD-10-CM | POA: Diagnosis not present

## 2022-04-18 DIAGNOSIS — O24410 Gestational diabetes mellitus in pregnancy, diet controlled: Secondary | ICD-10-CM

## 2022-04-18 DIAGNOSIS — O0992 Supervision of high risk pregnancy, unspecified, second trimester: Secondary | ICD-10-CM | POA: Insufficient documentation

## 2022-04-18 DIAGNOSIS — Z368A Encounter for antenatal screening for other genetic defects: Secondary | ICD-10-CM

## 2022-04-18 DIAGNOSIS — Z362 Encounter for other antenatal screening follow-up: Secondary | ICD-10-CM

## 2022-04-19 ENCOUNTER — Encounter: Payer: BC Managed Care – PPO | Admitting: Obstetrics and Gynecology

## 2022-04-19 ENCOUNTER — Encounter: Payer: Self-pay | Admitting: Obstetrics and Gynecology

## 2022-04-19 DIAGNOSIS — O4403 Placenta previa specified as without hemorrhage, third trimester: Secondary | ICD-10-CM | POA: Insufficient documentation

## 2022-04-19 DIAGNOSIS — O4400 Placenta previa specified as without hemorrhage, unspecified trimester: Secondary | ICD-10-CM | POA: Insufficient documentation

## 2022-05-17 ENCOUNTER — Encounter: Payer: Self-pay | Admitting: Obstetrics and Gynecology

## 2022-05-17 ENCOUNTER — Telehealth (INDEPENDENT_AMBULATORY_CARE_PROVIDER_SITE_OTHER): Payer: BC Managed Care – PPO | Admitting: Obstetrics and Gynecology

## 2022-05-17 ENCOUNTER — Ambulatory Visit: Payer: BC Managed Care – PPO | Attending: Maternal & Fetal Medicine

## 2022-05-17 ENCOUNTER — Ambulatory Visit: Payer: BC Managed Care – PPO | Admitting: *Deleted

## 2022-05-17 ENCOUNTER — Other Ambulatory Visit: Payer: Self-pay | Admitting: *Deleted

## 2022-05-17 VITALS — BP 129/69 | HR 89

## 2022-05-17 VITALS — Wt 246.0 lb

## 2022-05-17 DIAGNOSIS — O0990 Supervision of high risk pregnancy, unspecified, unspecified trimester: Secondary | ICD-10-CM | POA: Diagnosis not present

## 2022-05-17 DIAGNOSIS — O282 Abnormal cytological finding on antenatal screening of mother: Secondary | ICD-10-CM

## 2022-05-17 DIAGNOSIS — O09521 Supervision of elderly multigravida, first trimester: Secondary | ICD-10-CM

## 2022-05-17 DIAGNOSIS — O4402 Placenta previa specified as without hemorrhage, second trimester: Secondary | ICD-10-CM

## 2022-05-17 DIAGNOSIS — O99612 Diseases of the digestive system complicating pregnancy, second trimester: Secondary | ICD-10-CM

## 2022-05-17 DIAGNOSIS — O09522 Supervision of elderly multigravida, second trimester: Secondary | ICD-10-CM | POA: Insufficient documentation

## 2022-05-17 DIAGNOSIS — O24410 Gestational diabetes mellitus in pregnancy, diet controlled: Secondary | ICD-10-CM

## 2022-05-17 DIAGNOSIS — Z362 Encounter for other antenatal screening follow-up: Secondary | ICD-10-CM | POA: Diagnosis not present

## 2022-05-17 DIAGNOSIS — O10919 Unspecified pre-existing hypertension complicating pregnancy, unspecified trimester: Secondary | ICD-10-CM

## 2022-05-17 DIAGNOSIS — O10012 Pre-existing essential hypertension complicating pregnancy, second trimester: Secondary | ICD-10-CM | POA: Diagnosis not present

## 2022-05-17 DIAGNOSIS — O99212 Obesity complicating pregnancy, second trimester: Secondary | ICD-10-CM

## 2022-05-17 DIAGNOSIS — O3662X0 Maternal care for excessive fetal growth, second trimester, not applicable or unspecified: Secondary | ICD-10-CM

## 2022-05-17 DIAGNOSIS — A6004 Herpesviral vulvovaginitis: Secondary | ICD-10-CM

## 2022-05-17 DIAGNOSIS — O3660X0 Maternal care for excessive fetal growth, unspecified trimester, not applicable or unspecified: Secondary | ICD-10-CM | POA: Insufficient documentation

## 2022-05-17 DIAGNOSIS — O10912 Unspecified pre-existing hypertension complicating pregnancy, second trimester: Secondary | ICD-10-CM | POA: Diagnosis not present

## 2022-05-17 DIAGNOSIS — O24419 Gestational diabetes mellitus in pregnancy, unspecified control: Secondary | ICD-10-CM | POA: Diagnosis not present

## 2022-05-17 DIAGNOSIS — Z3A23 23 weeks gestation of pregnancy: Secondary | ICD-10-CM

## 2022-05-17 DIAGNOSIS — Z6841 Body Mass Index (BMI) 40.0 and over, adult: Secondary | ICD-10-CM

## 2022-05-17 DIAGNOSIS — E669 Obesity, unspecified: Secondary | ICD-10-CM

## 2022-05-17 DIAGNOSIS — R87612 Low grade squamous intraepithelial lesion on cytologic smear of cervix (LGSIL): Secondary | ICD-10-CM

## 2022-05-17 NOTE — Progress Notes (Unsigned)
Fasting's are still around low 100's

## 2022-05-17 NOTE — Progress Notes (Unsigned)
TELEHEALTH OBSTETRICS VISIT ENCOUNTER NOTE  Provider location: Center for [place] at [facility]   Patient location: Home  I connected with Brandi [name] on 05/17/22 at  3:30 PM EDT by telephone at home and verified that I am speaking with the correct person using two identifiers. Of note, unable to do video encounter due to technical difficulties.    I discussed the limitations, risks, security and privacy concerns of performing an evaluation and management service by telephone and the availability of in person appointments. I also discussed with the patient that there may be a patient responsible charge related to this service. The patient expressed understanding and agreed to proceed.  Subjective:  Brandi [name] is a 36 y.o. G2P1001 at 109w1dbeing followed for ongoing prenatal care.  She is currently monitored for the following issues for this high-risk pregnancy and has Irritable bowel syndrome with diarrhea; Essential hypertension; Recurrent major depressive disorder, in partial remission ([place]; Gastroesophageal reflux disease; Chronic lower back pain; History of migraine; Chronic pain of both knees; Genital herpes; Mixed hyperlipidemia; Morbid obesity (HK[place]; Supervision of high risk pregnancy, antepartum; BMI 40.0-44.9, adult ([place]; Chronic hypertension affecting pregnancy; Multigravida of advanced maternal age in first trimester; Medication exposure during first trimester of pregnancy; LGSIL on Pap smear of cervix; GDM diagnosed at 12 weeks; Placenta previa; and LGA (large for gestational age) fetus affecting management of mother on their problem list.  Patient reports  b/l sharp, shooting nipple pain . Reports fetal movement. Denies any contractions, bleeding or leaking of fluid.   The following portions of the patient's history were reviewed and updated as appropriate: allergies, current medications, past family history, past medical history, past social history,  past surgical history and problem list.   Objective:  Weight 246 lb (111.6 kg), last menstrual period 11/30/2021. General:  Alert, oriented and cooperative.   Mental Status: Normal mood and affect perceived. Normal judgment and thought content.  Rest of physical exam deferred due to type of encounter  Assessment and Plan:  Pregnancy: G2P1001 at 287w1d. Supervision of high risk pregnancy, antepartum D/w pt re: birth control next visit  2. Chronic hypertension affecting pregnancy Doing well on labetalol 200 bid and low dose asa.  Continue qmonth growth u/s, qwk testing at 32wks, delivery at 37-39wks  3. Diet controlled gestational diabetes mellitus (GDM) in second trimester Am fasting in the low 100s, normal 2h post prandials. Sometimes AM is not a true fasting due to some snacking o/n. Based on baby being LGA today, I told her I recommend starting metformin 500 qhs, since her AM fasting sugar level is likely truly elevated, but she'd like to hold off for now. F/u in 2wks  4. Herpes simplex vulvovaginitis Ppx at 35-35wks  5. Placenta previa in second trimester Persistent today. Precautions reviewed with her  6. Morbid obesity (HCMuddyWeight stable  7. BMI 40.0-44.9, adult (HCSolomon 8. Excessive fetal growth affecting management of pregnancy in second trimester, single or unspecified fetus  9. LGSIL on Pap smear of cervix Needs pp colpo  10. AMA  Preterm labor symptoms and general obstetric precautions including but not limited to vaginal bleeding, contractions, leaking of fluid and fetal movement were reviewed in detail with the patient.  I discussed the assessment and treatment plan with the patient. The patient was provided an opportunity to ask questions and all were answered. The patient agreed with the plan and demonstrated an understanding of the instructions. The patient was advised to  call back or seek an in-person office evaluation/go to [facility] at  [facility]  for any urgent or concerning symptoms. Please refer to After Visit Summary for other counseling recommendations.   I provided 10 minutes of non-face-to-face time during this encounter.  Return in about 2 weeks (around 05/31/2022) for in person, md visit, high risk ob.  Future Appointments  Date Time Provider [place]  06/14/2022  1:15 PM [facility] NURSE [facility]  06/14/2022  1:30 PM [facility] US3 [facility]  07/24/2022  2:00 PM Baity, Coralie Keens, NP [place], MD Center for [facility], [place]

## 2022-05-18 ENCOUNTER — Telehealth: Payer: Self-pay

## 2022-05-18 NOTE — Telephone Encounter (Signed)
Left message for pt to call office back to schedule next prenatal appointment.

## 2022-06-05 ENCOUNTER — Other Ambulatory Visit: Payer: Self-pay | Admitting: Obstetrics and Gynecology

## 2022-06-05 ENCOUNTER — Encounter: Payer: BC Managed Care – PPO | Admitting: Obstetrics and Gynecology

## 2022-06-05 ENCOUNTER — Encounter: Payer: Self-pay | Admitting: Obstetrics and Gynecology

## 2022-06-14 ENCOUNTER — Ambulatory Visit: Payer: BC Managed Care – PPO | Admitting: *Deleted

## 2022-06-14 ENCOUNTER — Telehealth (INDEPENDENT_AMBULATORY_CARE_PROVIDER_SITE_OTHER): Payer: BC Managed Care – PPO | Admitting: Obstetrics & Gynecology

## 2022-06-14 ENCOUNTER — Ambulatory Visit: Payer: BC Managed Care – PPO | Attending: Obstetrics and Gynecology

## 2022-06-14 ENCOUNTER — Encounter: Payer: Self-pay | Admitting: Obstetrics & Gynecology

## 2022-06-14 VITALS — BP 128/75 | HR 88

## 2022-06-14 DIAGNOSIS — O10912 Unspecified pre-existing hypertension complicating pregnancy, second trimester: Secondary | ICD-10-CM | POA: Insufficient documentation

## 2022-06-14 DIAGNOSIS — O99212 Obesity complicating pregnancy, second trimester: Secondary | ICD-10-CM

## 2022-06-14 DIAGNOSIS — Z362 Encounter for other antenatal screening follow-up: Secondary | ICD-10-CM | POA: Insufficient documentation

## 2022-06-14 DIAGNOSIS — O0990 Supervision of high risk pregnancy, unspecified, unspecified trimester: Secondary | ICD-10-CM | POA: Diagnosis not present

## 2022-06-14 DIAGNOSIS — O09522 Supervision of elderly multigravida, second trimester: Secondary | ICD-10-CM

## 2022-06-14 DIAGNOSIS — O24419 Gestational diabetes mellitus in pregnancy, unspecified control: Secondary | ICD-10-CM | POA: Diagnosis not present

## 2022-06-14 DIAGNOSIS — Z3A27 27 weeks gestation of pregnancy: Secondary | ICD-10-CM

## 2022-06-14 DIAGNOSIS — O4402 Placenta previa specified as without hemorrhage, second trimester: Secondary | ICD-10-CM

## 2022-06-14 DIAGNOSIS — O10012 Pre-existing essential hypertension complicating pregnancy, second trimester: Secondary | ICD-10-CM

## 2022-06-14 DIAGNOSIS — O24410 Gestational diabetes mellitus in pregnancy, diet controlled: Secondary | ICD-10-CM

## 2022-06-14 DIAGNOSIS — E669 Obesity, unspecified: Secondary | ICD-10-CM

## 2022-06-14 DIAGNOSIS — O10919 Unspecified pre-existing hypertension complicating pregnancy, unspecified trimester: Secondary | ICD-10-CM

## 2022-06-14 NOTE — Progress Notes (Signed)
OBSTETRICS PRENATAL VIRTUAL VISIT ENCOUNTER NOTE  Provider location: [facility] for [place] at [facility]   Patient location: Home  I connected with Brandi [name] on 06/14/22 at  3:30 PM EDT by MyChart Video Encounter and verified that I am speaking with the correct person using two identifiers. I discussed the limitations, risks, security and privacy concerns of performing an evaluation and management service virtually and the availability of in person appointments. I also discussed with the patient that there may be a patient responsible charge related to this service. The patient expressed understanding and agreed to proceed. Subjective:  Brandi [name] is a 36 y.o. G2P1001 at 38w1dbeing seen today for ongoing prenatal care.  She is currently monitored for the following issues for this high-risk pregnancy and has Irritable bowel syndrome with diarrhea; Essential hypertension; Recurrent major depressive disorder, in partial remission (HC[place]; Gastroesophageal reflux disease; Chronic lower back pain; History of migraine; Chronic pain of both knees; Genital herpes; Mixed hyperlipidemia; Maternal morbid obesity, antepartum (HGray; Supervision of high risk pregnancy, antepartum; BMI 40.0-44.9, adult (HSeven [place]; Chronic hypertension affecting pregnancy; Multigravida of advanced maternal age; Medication exposure during first trimester of pregnancy; LGSIL on Pap smear of cervix; GDM diagnosed at 12 weeks; Placenta previa, anterior; and LGA (large for gestational age) fetus affecting management of mother on their problem list.  Patient reports no complaints.  Contractions: Not present. Vag. Bleeding: None.  Movement: Present. Denies any leaking of fluid.   The following portions of the patient's history were reviewed and updated as appropriate: allergies, current medications, past family history, past medical history, past social history, past surgical history and problem list.   Objective:    Vitals:   06/14/22 1514  BP: 128/75  Pulse: 88    Fetal Status: Fetal Heart Rate (bpm): 136 on u/s   Movement: Present     General:  Alert, oriented and cooperative. Patient is in no acute distress.  Respiratory: Normal respiratory effort, no problems with respiration noted  Mental Status: Normal mood and affect. Normal behavior. Normal judgment and thought content.  Rest of physical exam deferred due to type of encounter  Imaging: UKoreaMFM OB FOLLOW UP  Result Date: 06/14/2022 ----------------------------------------------------------------------  OBSTETRICS REPORT                       (Signed Final 06/14/2022 02:47 pm) ---------------------------------------------------------------------- Patient Info  ID #:       [id number]                         D.O.B.:  [birth year](36 yrs)  Name:       Brandi [name]              Visit Date: 06/14/2022 01:55 pm ---------------------------------------------------------------------- Performed By  Attending:        RTama HighMD        Ref. Address:     [address]                                                            [place]  [address]  Performed By:     Rodrigo Ran BS      [facility]T                                 Fetal Care at                                                             [place] for                                                             Women  Referred By:      Aletha Halim MD ---------------------------------------------------------------------- Orders  #  Description                           Code        Ordered By  1  [place] MFM OB FOLLOW UP                   [id number]    Tama High ----------------------------------------------------------------------  #  Order #                     Accession #                Episode #  1  [id number]                   [id number]                  [id number] ---------------------------------------------------------------------- Indications  [redacted] weeks gestation of pregnancy                Z3A.27  Hypertension - Chronic/Pre-existing            O10.019  (labetalol)  Advanced maternal age multigravida 65+,        O85.522  second trimester (35 yrs)  Gestational diabetes in pregnancy, diet        O24.410  controlled  Placenta previa specified as without           O44.02  hemorrhage, second trimester  Obesity complicating pregnancy, second         O99.212  trimester (pre-G BMI 43)  Encounter for other antenatal screening        Z36.2  follow-up  LR NIPS - Female, Negative Horizon  Gestational diabetes in pregnancy, diet        O24.410  controlled ---------------------------------------------------------------------- Fetal Evaluation  Num Of Fetuses:         1  Fetal Heart Rate(bpm):  136  Cardiac Activity:       Observed  Presentation:           Transverse, head to  maternal left  Placenta:               Anterior previa  P. Cord Insertion:      Visualized  Amniotic Fluid  AFI FV:      Within normal limits                              Largest Pocket(cm)                              4.4 ---------------------------------------------------------------------- Biometry  BPD:      69.5  mm     G. Age:  28w 0d         66  %    CI:        70.86   %    70 - 86                                                          FL/HC:      18.9   %    18.6 - 20.4  HC:      263.1  mm     G. Age:  28w 4d         71  %    HC/AC:      1.07        1.05 - 1.21  AC:      246.5  mm     G. Age:  28w 6d         88  %    FL/BPD:     71.5   %    71 - 87  FL:       49.7  mm     G. Age:  26w 5d         25  %    FL/AC:      20.2   %    20 - 24  LV:        3.8  mm  Est. FW:    1174  gm      2 lb 9 oz     76  % ---------------------------------------------------------------------- OB History  Gravidity:    2         Term:   1        Prem:   0        SAB:   0  TOP:          0       Ectopic:  0        Living: 1  ---------------------------------------------------------------------- Gestational Age  LMP:           28w 0d        Date:  11/30/21                  EDD:   09/06/22  U/S Today:     28w 0d                                        EDD:   09/06/22  Best:  27w 1d     Det. ByLoman Chroman         EDD:   09/12/22                                      (01/31/22) ---------------------------------------------------------------------- Anatomy  Cranium:               Appears normal         LVOT:                   Appears normal  Cavum:                 Appears normal         Aortic Arch:            Previously seen  Ventricles:            Appears normal         Ductal Arch:            Not well visualized  Choroid Plexus:        Previously seen        Diaphragm:              Appears normal  Cerebellum:            Appears normal         Stomach:                Appears normal, left                                                                        sided  Posterior Fossa:       Previously seen        Abdomen:                Appears normal  Nuchal Fold:           Previously seen        Abdominal Wall:         Previously seen  Face:                  Orbits and profile     Cord Vessels:           Previously seen                         previously seen  Lips:                  Previously seen        Kidneys:                Appear normal  Palate:                Not well visualized    Bladder:                Appears normal  Thoracic:              Appears normal         Spine:  Previously seen  Heart:                 Appears normal         Upper Extremities:      Previously seen                         (4CH, axis, and                         situs)  RVOT:                  Previously seen        Lower Extremities:      Previously seen  Other:  VC and 3VV visualized. Fetus appears to be a female. Heels/feet and          open hands/5th digits, Nasal bone, lenses, maxilla, mandible and falx          previously  visualized. Technically difficult due to maternal habitus. ---------------------------------------------------------------------- Cervix Uterus Adnexa  Cervix  Length:            3.7  cm.  Normal appearance by transabdominal scan.  Uterus  No abnormality visualized.  Right Ovary  Not visualized.  Left Ovary  Not visualized.  Cul De Sac  No free fluid seen.  Adnexa  No abnormality visualized. ---------------------------------------------------------------------- Impression  Hypertension.  Well-controlled on labetalol.  Blood pressure  today at her office is 128/75 mmHg.  Placenta previa.  Patient does not give history of vaginal  bleeding.  Gestational diabetes.  Well-controlled on diet.  Fetal growth is appropriate for gestational age.  Amniotic fluid  is normal and good fetal activity seen.  Anterior previa is seen. ---------------------------------------------------------------------- Recommendations  -An appointment was made for her to return in 5 weeks for  fetal growth assessment and BPP.  -Transvaginal ultrasound to rule out previa (in 5 weeks)  _Weekly BPP from 32 weeks' gestation till delivery. ----------------------------------------------------------------------                  Tama High, MD Electronically Signed Final Report   06/14/2022 02:47 pm ----------------------------------------------------------------------  [place] MFM OB FOLLOW UP  Result Date: 05/17/2022 ----------------------------------------------------------------------  OBSTETRICS REPORT                       (Signed Final 05/17/2022 01:59 pm) ---------------------------------------------------------------------- Patient Info  ID #:       [id number]                          D.O.B.:  July 26, 1986 (36 yrs)  Name:       Brandi [name]               Visit Date: 05/17/2022 12:46 pm ---------------------------------------------------------------------- Performed By  Attending:        Tama High MD        Ref. Address:     [address]                                                              [place], [place]  [address]  Performed By:     Nathen May       Location[facility]         [facility]                    RD[facility]                                     Fetal Care at                                                             [place] for                                                             Women  Referred By:      Aletha Halim MD ---------------------------------------------------------------------- Orders  #  Description                           Code        Ordered By  1  [place] MFM OB FOLLOW UP                   [id number]    Valeda Malm ----------------------------------------------------------------------  #  Order #                     Accession #                Episode #  1  [id number]                   [id number]                 [id number] ---------------------------------------------------------------------- Indications  Hypertension - Chronic/Pre-existing            O10.019  (labetalol)  Advanced maternal age multigravida 74+,        O23.522  second trimester (71 yrs)  Gestational diabetes in pregnancy, diet        O24.410  controlled  Placenta previa specified as without           O44.02  hemorrhage, second trimester  Obesity complicating pregnancy, second         O99.212  trimester (pre-G BMI 39)  Antenatal follow-up for nonvisualized fetal    Z36.2  anatomy  [redacted] weeks gestation of pregnancy                Z3A.23  LR NIPS - Female, Negative Horizon ---------------------------------------------------------------------- Vital Signs  BP:          129/69 ---------------------------------------------------------------------- Fetal Evaluation  Num Of Fetuses:         1  Fetal Heart Rate(bpm):  143  Cardiac Activity:       Observed  Presentation:           Transverse, head to maternal right  Placenta:               Anterior previa  P. Cord Insertion:      Previously  Visualized  Amniotic Fluid  AFI FV:      Within normal limits                              Largest Pocket(cm)                              5.18 ---------------------------------------------------------------------- Biometry  BPD:     58.88  mm     G. Age:  24w 1d         78  %    CI:        73.41   %    70 - 86                                                          FL/HC:      19.1   %    19.2 - 20.8  HC:    218.37   mm     G. Age:  23w 6d         65  %    HC/AC:      1.04        1.05 - 1.21  AC:    209.56   mm     G. Age:  25w 4d         96  %    FL/BPD:     70.9   %    71 - 87  FL:      41.74  mm     G. Age:  23w 4d         53  %    FL/AC:      19.9   %    20 - 24  CER:      24.3  mm     G. Age:  22w 2d         50  %  LV:        3.7  mm  CM:        4.1  mm  Est. FW:     711  gm      1 lb 9 oz     96  % ---------------------------------------------------------------------- OB History  Gravidity:    2         Term:   1        Prem:   0        SAB:   0  TOP:          0       Ectopic:  0        Living: 1 ---------------------------------------------------------------------- Gestational Age  LMP:           24w 0d        Date:  11/30/21                  EDD:   09/06/22  U/S Today:     24w 2d  EDD:   09/04/22  Best:          23w 1d     Det. ByLoman Chroman         EDD:   09/12/22                                      (01/31/22) ---------------------------------------------------------------------- Anatomy  Cranium:               Appears normal         LVOT:                   Appears normal  Cavum:                 Appears normal         Aortic Arch:            Appears normal  Ventricles:            Appears normal         Ductal Arch:            Not well visualized  Choroid Plexus:        Previously seen        Diaphragm:              Appears normal  Cerebellum:            Appears normal         Stomach:                Appears normal, left                                                                         sided  Posterior Fossa:       Appears normal         Abdomen:                Appears normal  Nuchal Fold:           Previously seen        Abdominal Wall:         Previously seen  Face:                  Orbits and profile     Cord Vessels:           Appears normal (3                         previously seen                                vessel cord)  Lips:                  Previously seen        Kidneys:                Appear normal  Palate:                Not well visualized    Bladder:  Appears normal  Thoracic:              Appears normal         Spine:                  Previously seen  Heart:                 Not well visualized    Upper Extremities:      Previously seen  RVOT:                  Appears normal         Lower Extremities:      Previously seen  Other:  Fetus appears to be a female. Heels/feet and open hands/5th digits,          Nasal bone, lenses, maxilla, mandible and falx previously visualized.          Technically difficult due to maternal habitus. ---------------------------------------------------------------------- Cervix Uterus Adnexa  Cervix  Length:              5  cm.  Normal appearance by transabdominal scan. ---------------------------------------------------------------------- Impression  -Chronic pretension.  Well-controlled on labetalol.  Blood  pressure today at her office is 129/69 mmHg.  -Gestational diabetes.  Well-controlled on diet.  -Placenta previa.  No history of vaginal bleeding.  Patient returned for completion of fetal anatomy.  Fetal growth is appropriate for gestational age .Amniotic fluid  is normal and good fetal activity is seen. Anatomical survey  could not be completed because of fetal position ---------------------------------------------------------------------- Recommendations  -An appointment was made for her to return in 4 weeks for  fetal growth assessment.  -Transvaginal ultrasound to rule out placenta previa.  ----------------------------------------------------------------------                 Tama High, MD Electronically Signed Final Report   05/17/2022 01:59 pm ----------------------------------------------------------------------   Assessment and Plan:  Pregnancy: G2P1001 at 86w1d1. Diet controlled gestational diabetes mellitus (GDM) in second trimester Reports fasting in 90s, a couple in 100s. PP 90s-110s.  Reports most of fastings are in 90s. She was told that if over half of the readings per week are over 95, she may need medication (insulin or Metformin at bedtime) to help with control of fasting values.  Will evaluate during next visit. Continue diet control for now.  2. Chronic hypertension affecting pregnancy Stable BP on Labetalol. Normal growth scan today.  Already set up for antenatal testing  and subsequent scans later in pregnancy.  3. Placenta previa in second trimester Still present for now, previa precautions reviewed.  4. [redacted] weeks gestation of pregnancy 5. Supervision of high risk pregnancy, antepartum Discussed contraception, she is thinking about PPBTS. Will sign papers next visit and have further evaluation for this.  She will also get third trimester labs next visit, also will be offered Tdap/Flu vaccines. Preterm labor symptoms and general obstetric precautions including but not limited to vaginal bleeding, contractions, leaking of fluid and fetal movement were reviewed in detail with the patient. I discussed the assessment and treatment plan with the patient. The patient was provided an opportunity to ask questions and all were answered. The patient agreed with the plan and demonstrated an understanding of the instructions. The patient was advised to call back or seek an in-person office evaluation/[facility] or concerning [facility]. Please refer to After Visit Summary for other counseling recommendations.   I  provided 15 minutes  of face-to-face time during this encounter.  Return in about 2 weeks (around 06/28/2022) for OFFICE OB VISIT (MD only) and labs (CBC, CMET, A1C, RPR, HIV), Tdap/Flu vaccine, BTL papers.  Future Appointments  Date Time Provider [place]  07/19/2022  1:15 PM [facility] NURSE [facility]  07/19/2022  1:30 PM [facility] US3 [facility]  07/24/2022  2:00 PM Jearld Fenton, NP [facility]  07/25/2022  2:30 PM [facility] NURSE [facility]  07/25/2022  2:45 PM [facility] US4 [facility]  08/01/2022  2:15 PM [facility] NURSE [facility]  08/01/2022  2:30 PM [facility] US2 [facility] [place]    Verita Schneiders, MD Center for [employer] [facility], [place]

## 2022-06-14 NOTE — Patient Instructions (Signed)

## 2022-06-15 ENCOUNTER — Other Ambulatory Visit: Payer: Self-pay | Admitting: *Deleted

## 2022-06-15 DIAGNOSIS — O4402 Placenta previa specified as without hemorrhage, second trimester: Secondary | ICD-10-CM

## 2022-06-15 DIAGNOSIS — O10912 Unspecified pre-existing hypertension complicating pregnancy, second trimester: Secondary | ICD-10-CM

## 2022-06-15 NOTE — Progress Notes (Unsigned)
Us/

## 2022-06-27 ENCOUNTER — Ambulatory Visit (INDEPENDENT_AMBULATORY_CARE_PROVIDER_SITE_OTHER): Payer: BC Managed Care – PPO | Admitting: Family Medicine

## 2022-06-27 ENCOUNTER — Encounter: Payer: Self-pay | Admitting: Family Medicine

## 2022-06-27 VITALS — BP 130/87 | HR 106 | Wt 246.0 lb

## 2022-06-27 DIAGNOSIS — Z3A29 29 weeks gestation of pregnancy: Secondary | ICD-10-CM

## 2022-06-27 DIAGNOSIS — O10913 Unspecified pre-existing hypertension complicating pregnancy, third trimester: Secondary | ICD-10-CM

## 2022-06-27 DIAGNOSIS — A6004 Herpesviral vulvovaginitis: Secondary | ICD-10-CM

## 2022-06-27 DIAGNOSIS — O10919 Unspecified pre-existing hypertension complicating pregnancy, unspecified trimester: Secondary | ICD-10-CM

## 2022-06-27 DIAGNOSIS — O24410 Gestational diabetes mellitus in pregnancy, diet controlled: Secondary | ICD-10-CM | POA: Diagnosis not present

## 2022-06-27 DIAGNOSIS — O3663X0 Maternal care for excessive fetal growth, third trimester, not applicable or unspecified: Secondary | ICD-10-CM

## 2022-06-27 DIAGNOSIS — O0993 Supervision of high risk pregnancy, unspecified, third trimester: Secondary | ICD-10-CM

## 2022-06-27 DIAGNOSIS — O0990 Supervision of high risk pregnancy, unspecified, unspecified trimester: Secondary | ICD-10-CM

## 2022-06-27 DIAGNOSIS — O09523 Supervision of elderly multigravida, third trimester: Secondary | ICD-10-CM

## 2022-06-27 DIAGNOSIS — O3662X0 Maternal care for excessive fetal growth, second trimester, not applicable or unspecified: Secondary | ICD-10-CM

## 2022-06-27 NOTE — Progress Notes (Signed)
PRENATAL VISIT NOTE  Subjective:  Brandi [name] is a 36 y.o. G2P1001 at 46w0dbeing seen today for ongoing prenatal care.  She is currently monitored for the following issues for this high-risk pregnancy and has Irritable bowel syndrome with diarrhea; Essential hypertension; Recurrent major depressive disorder, in partial remission ([place]; Gastroesophageal reflux disease; Chronic lower back pain; History of migraine; Chronic pain of both knees; Genital herpes; Mixed hyperlipidemia; Maternal morbid obesity, antepartum ([place]; Supervision of high risk pregnancy, antepartum; BMI 40.0-44.9, adult ([place]; Chronic hypertension affecting pregnancy; Multigravida of advanced maternal age; Medication exposure during first trimester of pregnancy; LGSIL on Pap smear of cervix; GDM diagnosed at 12 weeks; Placenta previa, anterior; and LGA (large for gestational age) fetus affecting management of mother on their problem list.  Patient reports no complaints.  Contractions: Not present. Vag. Bleeding: None.  Movement: Present. Denies leaking of fluid.   The following portions of the patient's history were reviewed and updated as appropriate: allergies, current medications, past family history, past medical history, past social history, past surgical history and problem list.   Objective:   Vitals:   06/27/22 1457  BP: 130/87  Pulse: (!) 106  Weight: 246 lb (111.6 kg)    Fetal Status: Fetal Heart Rate (bpm): 140 Fundal Height: 32 cm Movement: Present     General:  Alert, oriented and cooperative. Patient is in no acute distress.  Skin: Skin is warm and dry. No rash noted.   Cardiovascular: Normal heart rate noted  Respiratory: Normal respiratory effort, no problems with respiration noted  Abdomen: Soft, gravid, appropriate for gestational age.  Pain/Pressure: Absent     Pelvic: Cervical exam deferred        Extremities: Normal range of motion.  Edema: None  Mental Status: Normal mood and affect.  Normal behavior. Normal judgment and thought content.   Assessment and Plan:  Pregnancy: G2P1001 at 259w0d. Supervision of high risk pregnancy, antepartum 28 week labs - RPR - CBC - HIV Antibody (routine testing w rflx)  2. Chronic hypertension affecting pregnancy BP is well controlled on Labetalol On ASA Last EFW 76%  3. Herpes simplex vulvovaginitis Not on suppression, begin at 34 weeks  4. Diet controlled gestational diabetes mellitus (GDM) in second trimester No log book, reports CBGs fastings are in the 8090sncouraged to bring book Check A1C today - Hemoglobin A1c  5. Multigravida of advanced maternal age in third trimester LR NIPT  6. Excessive fetal growth affecting management of pregnancy in second trimester, single or unspecified fetus Last growth ok  7. Placenta previa For re-eval at next u/s  Preterm labor symptoms and general obstetric precautions including but not limited to vaginal bleeding, contractions, leaking of fluid and fetal movement were reviewed in detail with the patient. Please refer to After Visit Summary for other counseling recommendations.   Return in 2 weeks (on 07/11/2022).  Future Appointments  Date Time Provider DeTall[place]11/05/2022  8:35 AM PiAletha HalimMD [facility]  07/19/2022  1:15 PM [facility] NURSE [facility]07/19/2022  1:30 PM [facility] US3 [facility]/21/2023  2:00 PM BaJearld FentonNP [facility]  07/25/2022 11:15 AM PrDonnamae JudeMD [facility]  07/25/2022  2:30 PM [facility] NURSE [facility]11/22/2023  2:45 PM [facility] US4 [facility]/29/2023  2:15 PM [facility] NURSE [facility]11/29/2023  2:30 PM [facility] US2 [facility]12/02/2022  1:30 PM PrDonnamae JudeMD [facility]  08/22/2022  1:30 PM Aletha Halim, MD [facility]    [name], MD

## 2022-06-27 NOTE — Progress Notes (Signed)
ROB 29w  CC: Breast Pain /Nipple pain Breast will sometimes get "Red".  Flu Vaccine: Declines. T-Dap: Declines.    Pt forgot blood sugar log.   Fasting blood sugars have been in the 80's.

## 2022-06-28 LAB — CBC
Hematocrit: 35.7 % (ref 34.0–46.6)
Hemoglobin: 11.9 g/dL (ref 11.1–15.9)
MCH: 31.2 pg (ref 26.6–33.0)
MCHC: 33.3 g/dL (ref 31.5–35.7)
MCV: 94 fL (ref 79–97)
Platelets: 295 10*3/uL (ref 150–450)
RBC: 3.82 x10E6/uL (ref 3.77–5.28)
RDW: 13.2 % (ref 11.7–15.4)
WBC: 15.9 10*3/uL — ABNORMAL HIGH (ref 3.4–10.8)

## 2022-06-28 LAB — HEMOGLOBIN A1C
Est. average glucose Bld gHb Est-mCnc: 114 mg/dL
Hgb A1c MFr Bld: 5.6 % (ref 4.8–5.6)

## 2022-06-28 LAB — HIV ANTIBODY (ROUTINE TESTING W REFLEX): HIV Screen 4th Generation wRfx: NONREACTIVE

## 2022-06-28 LAB — RPR: RPR Ser Ql: NONREACTIVE

## 2022-07-06 ENCOUNTER — Other Ambulatory Visit: Payer: Self-pay

## 2022-07-06 ENCOUNTER — Observation Stay: Payer: BC Managed Care – PPO

## 2022-07-06 ENCOUNTER — Inpatient Hospital Stay
Admission: EM | Admit: 2022-07-06 | Discharge: 2022-07-10 | DRG: 832 | Disposition: A | Discharge status: Home / Self Care | Payer: BC Managed Care – PPO

## 2022-07-06 DIAGNOSIS — O4433 Partial placenta previa with hemorrhage, third trimester: Secondary | ICD-10-CM | POA: Diagnosis not present

## 2022-07-06 DIAGNOSIS — K219 Gastro-esophageal reflux disease without esophagitis: Secondary | ICD-10-CM | POA: Diagnosis present

## 2022-07-06 DIAGNOSIS — Z3A30 30 weeks gestation of pregnancy: Secondary | ICD-10-CM

## 2022-07-06 DIAGNOSIS — O99213 Obesity complicating pregnancy, third trimester: Secondary | ICD-10-CM | POA: Diagnosis present

## 2022-07-06 DIAGNOSIS — O10913 Unspecified pre-existing hypertension complicating pregnancy, third trimester: Secondary | ICD-10-CM | POA: Diagnosis not present

## 2022-07-06 DIAGNOSIS — O321XX0 Maternal care for breech presentation, not applicable or unspecified: Secondary | ICD-10-CM | POA: Diagnosis present

## 2022-07-06 DIAGNOSIS — Z3A31 31 weeks gestation of pregnancy: Secondary | ICD-10-CM | POA: Diagnosis not present

## 2022-07-06 DIAGNOSIS — O163 Unspecified maternal hypertension, third trimester: Secondary | ICD-10-CM

## 2022-07-06 DIAGNOSIS — A6000 Herpesviral infection of urogenital system, unspecified: Secondary | ICD-10-CM | POA: Diagnosis present

## 2022-07-06 DIAGNOSIS — O4413 Placenta previa with hemorrhage, third trimester: Principal | ICD-10-CM | POA: Diagnosis present

## 2022-07-06 DIAGNOSIS — O99613 Diseases of the digestive system complicating pregnancy, third trimester: Secondary | ICD-10-CM | POA: Diagnosis not present

## 2022-07-06 DIAGNOSIS — O10919 Unspecified pre-existing hypertension complicating pregnancy, unspecified trimester: Secondary | ICD-10-CM

## 2022-07-06 DIAGNOSIS — O4403 Placenta previa specified as without hemorrhage, third trimester: Secondary | ICD-10-CM | POA: Diagnosis present

## 2022-07-06 DIAGNOSIS — O99013 Anemia complicating pregnancy, third trimester: Secondary | ICD-10-CM

## 2022-07-06 DIAGNOSIS — O42913 Preterm premature rupture of membranes, unspecified as to length of time between rupture and onset of labor, third trimester: Secondary | ICD-10-CM | POA: Diagnosis present

## 2022-07-06 DIAGNOSIS — Z880 Allergy status to penicillin: Secondary | ICD-10-CM

## 2022-07-06 DIAGNOSIS — O09523 Supervision of elderly multigravida, third trimester: Secondary | ICD-10-CM | POA: Diagnosis not present

## 2022-07-06 DIAGNOSIS — Z87891 Personal history of nicotine dependence: Secondary | ICD-10-CM

## 2022-07-06 DIAGNOSIS — O3663X0 Maternal care for excessive fetal growth, third trimester, not applicable or unspecified: Secondary | ICD-10-CM | POA: Diagnosis not present

## 2022-07-06 DIAGNOSIS — O99210 Obesity complicating pregnancy, unspecified trimester: Secondary | ICD-10-CM

## 2022-07-06 DIAGNOSIS — O98313 Other infections with a predominantly sexual mode of transmission complicating pregnancy, third trimester: Secondary | ICD-10-CM | POA: Diagnosis not present

## 2022-07-06 DIAGNOSIS — O24410 Gestational diabetes mellitus in pregnancy, diet controlled: Secondary | ICD-10-CM | POA: Diagnosis not present

## 2022-07-06 DIAGNOSIS — Z7982 Long term (current) use of aspirin: Secondary | ICD-10-CM | POA: Diagnosis not present

## 2022-07-06 DIAGNOSIS — O09513 Supervision of elderly primigravida, third trimester: Secondary | ICD-10-CM | POA: Diagnosis not present

## 2022-07-06 DIAGNOSIS — O24420 Gestational diabetes mellitus in childbirth, diet controlled: Secondary | ICD-10-CM | POA: Diagnosis not present

## 2022-07-06 DIAGNOSIS — O4693 Antepartum hemorrhage, unspecified, third trimester: Secondary | ICD-10-CM | POA: Diagnosis present

## 2022-07-06 DIAGNOSIS — O4420 Partial placenta previa NOS or without hemorrhage, unspecified trimester: Secondary | ICD-10-CM

## 2022-07-06 DIAGNOSIS — O10013 Pre-existing essential hypertension complicating pregnancy, third trimester: Secondary | ICD-10-CM | POA: Diagnosis not present

## 2022-07-06 DIAGNOSIS — Z3689 Encounter for other specified antenatal screening: Secondary | ICD-10-CM | POA: Diagnosis not present

## 2022-07-06 LAB — RUPTURE OF MEMBRANE (ROM)PLUS: Rom Plus: POSITIVE

## 2022-07-06 LAB — WET PREP, GENITAL
Clue Cells Wet Prep HPF POC: NONE SEEN
Sperm: NONE SEEN
Trich, Wet Prep: NONE SEEN
WBC, Wet Prep HPF POC: <10 — AB (ref <10)
Yeast Wet Prep HPF POC: NONE SEEN

## 2022-07-06 MED ORDER — BETAMETHASONE SOD PHOS & ACET 6 (3-3) MG/ML IJ SUSP
INTRAMUSCULAR | Status: AC
  Administered 2022-07-06: 12 mg via INTRAMUSCULAR
  Filled 2022-07-06: qty 5

## 2022-07-06 MED ORDER — BETAMETHASONE SOD PHOS & ACET 6 (3-3) MG/ML IJ SUSP
12.0000 mg | INTRAMUSCULAR | Status: AC
  Administered 2022-07-07: 12 mg via INTRAMUSCULAR
  Filled 2022-07-06: qty 5

## 2022-07-06 NOTE — H&P (Signed)
OB History & Physical   History of Present Illness:  Chief Complaint:   HPI:  Brandi [name] is a 36 y.o. G75P1001 female at 74w2ddated by an 8 week u/s.  She presents to L&D for active vaginal bleeding.  She reports:  -active fetal movement -no leakage of fluid -Vaginal bleeding -no contractions  Pregnancy Issues: 1. Anterior Placenta Previa 2. Depression 3. AMA 4. Obesity 5. HSV 6. GDM 7. GERD 8. CHTN   Maternal Medical History:   Past Medical History:  Diagnosis Date   Abnormal Pap smear of cervix 12/2015   ascus/pos- achd   Allergies    Arthritis    Asthma    Chlamydial cervicitis    Depression    pt has past treatment w/ Zoloft - good symptom control, but worsening IBS symptoms   Diabetes mellitus without complication (HCC)    Genital HSV    GERD (gastroesophageal reflux disease)    Heart murmur    Hypertension    IBS (irritable bowel syndrome)    Prediabetes 01/19/2022   _0  early GTT   Thrombocytosis 07/20/2021   Trichomoniasis     Past Surgical History:  Procedure Laterality Date   COLONOSCOPY WITH PROPOFOL N/A 12/29/2020   Procedure: COLONOSCOPY WITH BIOPSY;  Surgeon: VLin Landsman MD;  [facility]  Service: Endoscopy;  Laterality: N/A;   COLPOSCOPY  04/17/2022   ESOPHAGOGASTRODUODENOSCOPY (EGD) WITH PROPOFOL N/A 12/29/2020   Procedure: ESOPHAGOGASTRODUODENOSCOPY (EGD) WITH BIOPSY;  Surgeon: VLin Landsman MD;  [facility]  Service: Endoscopy;  Laterality: N/A;   NO PAST SURGERIES     POLYPECTOMY N/A 12/29/2020   Procedure: POLYPECTOMY;  Surgeon: VLin Landsman MD;  [place] [facility]  Service: Endoscopy;  Laterality: N/A;    Allergies  Allergen Reactions   Viberzi [Eluxadoline] Itching   Amoxicillin Nausea And Vomiting and Rash    Prior to Admission medications   Medication Sig Start Date End Date Taking? Authorizing Provider  aspirin EC 81 MG tablet Take 1 tablet (81 mg  total) by mouth daily. 04/17/22  Yes PAletha Halim MD  labetalol (NORMODYNE) 200 MG tablet Take 1 tablet (200 mg total) by mouth 2 (two) times daily. 02/22/22  Yes PAletha Halim MD  omeprazole (PRILOSEC) 20 MG capsule Take 1 capsule (20 mg total) by mouth daily. 02/27/22  Yes Baity, RCoralie Keens NP  Accu-Chek Softclix Lancets lancets 1 each by Other route 4 (four) times daily. 03/05/22   PAletha Halim MD  Albuterol Sulfate 108 (90 Base) MCG/ACT AEPB Inhale into the lungs. 10/24/18 12/29/20  [provider]  Blood Glucose Monitoring Suppl (ACCU-CHEK GUIDE) w/Device KIT 1 Device by Does not apply route 4 (four) times daily. 03/05/22   PAletha Halim MD  cyclobenzaprine (FLEXERIL) 10 MG tablet Take 1 tablet (10 mg total) by mouth 2 (two) times daily as needed for muscle spasms. 10/21/21   HScot Jun FNP  fluticasone (FLONASE) 50 MCG/ACT nasal spray Place 1 spray into both nostrils 2 (two) times daily. 08/15/21   BJearld Fenton NP  glucose blood (ACCU-CHEK GUIDE) test strip Use to check blood sugars four times a day was instructed 03/05/22   PAletha Halim MD  hydrocortisone (ANUSOL-HC) 2.5 % rectal cream Place 1 application rectally 2 (two) times daily as needed for hemorrhoids or anal itching. 10/22/18   KMikey [employer] NP  loratadine (CLARITIN) 10 MG tablet Take 10 mg by mouth daily as needed for allergies.  [provider]  Magnesium Oxide (MAGNESIUM OXIDE 400) 240 MG PACK Take 1 tablet by mouth daily as needed (headache). 02/22/22   Aletha Halim, MD  Multiple Vitamins-Minerals (WOMENS DAILY FORMULA PO) Take by mouth.    [provider]  valACYclovir (VALTREX) 500 MG tablet 1 TABLET TWICE A DAY X 3 DAYS AS NEEDED FOR OUTBREAKS 07/10/21   Jearld Fenton, NP     Prenatal care site: [facility]  Social History: She  reports that she quit smoking about 6 months ago. Her smoking use included cigarettes. She has a 12.00 pack-year smoking history. She has  never used smokeless tobacco. She reports that she does not currently use alcohol after a past usage of about 1.0 standard drink of alcohol per week. She reports that she does not currently use drugs after having used the following drugs: Marijuana. Frequency: 7.00 times per week.  Family History: family history includes ADD / ADHD in her son; Asthma in her father and sister; Cancer in her maternal aunt, maternal grandmother, and maternal uncle; Diabetes in her mother; Heart attack in her father; Heart disease in her father, maternal grandfather, and paternal grandfather; Hypertension in her mother and sister; Lung cancer in her maternal grandmother and paternal grandmother.   Review of Systems: A full review of systems was performed and negative except as noted in the HPI.    Physical Exam:  Vital Signs: LMP 11/30/2021   General:   alert, cooperative, and anxious  Skin:  normal  Neurologic:    Alert & oriented x 3  Lungs:    Nl effort  Heart:   regular rate and rhythm  Abdomen:  soft, non-tender; bowel sounds normal; no masses,  no organomegaly  Extremities: : non-tender, symmetric, No edema bilaterally.       No results found for this or any previous visit (from the past 24 hour(s)).  Pertinent Results:  Prenatal Labs: Blood type/Rh A pos  Antibody screen neg  Rubella Non-Immune  Varicella   RPR NR  HBsAg Neg  HIV NR  GC neg  Chlamydia neg  Genetic screening negative  1 hour GTT 2 hr GTT 100, 192, 160  3 hour GTT   GBS    FHT: FHR: 125 bpm, variability: moderate,  accelerations:  Present,  decelerations:  Absent Category/reactivity:  Category I TOCO: none SVE: deferred     [place] [facility] OB FOLLOW UP  Result Date: 06/14/2022 ----------------------------------------------------------------------  OBSTETRICS REPORT                       (Signed Final 06/14/2022 02:47 pm) ---------------------------------------------------------------------- Patient Info  ID #:       [id number]                           D.O.B.:  11-27-1985 (36 yrs)  Name:       Brandi [name]               Visit Date: 06/14/2022 01:55 pm ---------------------------------------------------------------------- Performed By  Attending:        Tama High MD        Ref. Address:     [address]  [place], [place]  Performed By:     Rodrigo Ran BS      [facility]                                                             [place] [facility]                                                             [facility]  Referred By:      Aletha Halim MD ---------------------------------------------------------------------- Orders  #  Description                           Code        Ordered By  1  [place] [facility] OB FOLLOW UP                   50093.81    Tama High ----------------------------------------------------------------------  #  Order #                     Accession #                Episode #  1  [id number]                   [id number]                 [id number] ---------------------------------------------------------------------- Indications  [redacted] weeks gestation of pregnancy                Z3A.27  Hypertension - Chronic/Pre-existing            O10.019  (labetalol)  Advanced maternal age multigravida 20+,        O1.522  second trimester (89 yrs)  Gestational diabetes in pregnancy, diet        O24.410  controlled  Placenta previa specified as without           O44.02  hemorrhage, second trimester  Obesity complicating pregnancy, second         O99.212  trimester (pre-G BMI 39)  Encounter for other antenatal screening        Z36.2  follow-up  LR NIPS - Female, Negative Horizon  Gestational  diabetes in pregnancy, diet        O24.410  controlled  ---------------------------------------------------------------------- Fetal Evaluation  Num Of Fetuses:         1  Fetal Heart Rate(bpm):  136  Cardiac Activity:       Observed  Presentation:           Transverse, head to maternal left  Placenta:               Anterior previa  P. Cord Insertion:      Visualized  Amniotic Fluid  AFI FV:      Within normal limits                              Largest Pocket(cm)                              4.4 ---------------------------------------------------------------------- Biometry  BPD:      69.5  mm     G. Age:  28w 0d         66  %    CI:        70.86   %    70 - 86                                                          FL/HC:      18.9   %    18.6 - 20.4  HC:      263.1  mm     G. Age:  28w 4d         71  %    HC/AC:      1.07        1.05 - 1.21  AC:      246.5  mm     G. Age:  28w 6d         88  %    FL/BPD:     71.5   %    71 - 87  FL:       49.7  mm     G. Age:  26w 5d         25  %    FL/AC:      20.2   %    20 - 24  LV:        3.8  mm  Est. FW:    1174  gm      2 lb 9 oz     76  % ---------------------------------------------------------------------- OB History  Gravidity:    2         Term:   1        Prem:   0        SAB:   0  TOP:          0       Ectopic:  0        Living: 1 ---------------------------------------------------------------------- Gestational Age  LMP:           28w 0d        Date:  11/30/21                  EDD:   09/06/22  U/S Today:     28w 0d                                        EDD:   09/06/22  Best:          27w 1d     Det. ByLoman Chroman         EDD:   09/12/22                                      (01/31/22) ---------------------------------------------------------------------- Anatomy  Cranium:               Appears normal         LVOT:                   Appears normal  Cavum:                 Appears normal         Aortic Arch:            Previously seen  Ventricles:            Appears normal         Ductal Arch:            Not well  visualized  Choroid Plexus:        Previously seen        Diaphragm:              Appears normal  Cerebellum:            Appears normal         Stomach:                Appears normal, left                                                                        sided  Posterior Fossa:       Previously seen        Abdomen:                Appears normal  Nuchal Fold:           Previously seen        Abdominal Wall:         Previously seen  Face:                  Orbits and profile     Cord Vessels:           Previously seen                         previously seen  Lips:                  Previously seen        Kidneys:                Appear normal  Palate:  Not well visualized    Bladder:                Appears normal  Thoracic:              Appears normal         Spine:                  Previously seen  Heart:                 Appears normal         Upper Extremities:      Previously seen                         (4CH, axis, and                         situs)  RVOT:                  Previously seen        Lower Extremities:      Previously seen  Other:  VC and 3VV visualized. Fetus appears to be a female. Heels/feet and          open hands/5th digits, Nasal bone, lenses, maxilla, mandible and falx          previously visualized. Technically difficult due to maternal habitus. ---------------------------------------------------------------------- Cervix Uterus Adnexa  Cervix  Length:            3.7  cm.  Normal appearance by transabdominal scan.  Uterus  No abnormality visualized.  Right Ovary  Not visualized.  Left Ovary  Not visualized.  Cul De Sac  No free fluid seen.  Adnexa  No abnormality visualized. ---------------------------------------------------------------------- Impression  Hypertension.  Well-controlled on labetalol.  Blood pressure  today at her office is 128/75 mmHg.  Placenta previa.  Patient does not give history of vaginal  bleeding.  Gestational diabetes.  Well-controlled on diet.  Fetal  growth is appropriate for gestational age.  Amniotic fluid  is normal and good fetal activity seen.  Anterior previa is seen. ---------------------------------------------------------------------- Recommendations  -An appointment was made for her to return in 5 weeks for  fetal growth assessment and BPP.  -Transvaginal ultrasound to rule out previa (in 5 weeks)  _Weekly BPP from 32 weeks' gestation till delivery. ----------------------------------------------------------------------                  Tama High, MD Electronically Signed Final Report   06/14/2022 02:47 pm ----------------------------------------------------------------------    Assessment:  Brandi [name] is a 36 y.o. G2P1001 female at 28w2dwith vaginal bleeding at 321w2dith an anterior placenta previa.   Plan:  1. Admit to Labor & Delivery; consents reviewed and obtained  2. Monitoring of Vaginal Bleeding  3. Fetal Well being  - Fetal Tracing: Cat I - Presentation: breech confirmed by u/s        JELinda HedgesCNM 07/06/2022 11:12 PM

## 2022-07-06 NOTE — OB Triage Note (Signed)
Pt presents to triage with c/o vaginal bleeding since 2130. Pt states that she wiped and noticed blood on her toilet paper and a small amount in her toilet.  Since arriving to the hospital, bleeding is much heavier.  Denies abd pain, cramping, or ctx. +FM. Pain: 0/10.

## 2022-07-07 ENCOUNTER — Other Ambulatory Visit: Payer: Self-pay | Admitting: Obstetrics and Gynecology

## 2022-07-07 ENCOUNTER — Encounter: Payer: Self-pay | Admitting: Obstetrics and Gynecology

## 2022-07-07 DIAGNOSIS — O99213 Obesity complicating pregnancy, third trimester: Secondary | ICD-10-CM | POA: Diagnosis present

## 2022-07-07 DIAGNOSIS — O99613 Diseases of the digestive system complicating pregnancy, third trimester: Secondary | ICD-10-CM | POA: Diagnosis present

## 2022-07-07 DIAGNOSIS — O163 Unspecified maternal hypertension, third trimester: Secondary | ICD-10-CM | POA: Diagnosis not present

## 2022-07-07 DIAGNOSIS — O98313 Other infections with a predominantly sexual mode of transmission complicating pregnancy, third trimester: Secondary | ICD-10-CM | POA: Diagnosis present

## 2022-07-07 DIAGNOSIS — Z3A30 30 weeks gestation of pregnancy: Secondary | ICD-10-CM | POA: Diagnosis not present

## 2022-07-07 DIAGNOSIS — A6000 Herpesviral infection of urogenital system, unspecified: Secondary | ICD-10-CM | POA: Diagnosis present

## 2022-07-07 DIAGNOSIS — K219 Gastro-esophageal reflux disease without esophagitis: Secondary | ICD-10-CM | POA: Diagnosis present

## 2022-07-07 DIAGNOSIS — Z87891 Personal history of nicotine dependence: Secondary | ICD-10-CM | POA: Diagnosis not present

## 2022-07-07 DIAGNOSIS — Z7982 Long term (current) use of aspirin: Secondary | ICD-10-CM | POA: Diagnosis not present

## 2022-07-07 DIAGNOSIS — O42913 Preterm premature rupture of membranes, unspecified as to length of time between rupture and onset of labor, third trimester: Secondary | ICD-10-CM | POA: Diagnosis present

## 2022-07-07 DIAGNOSIS — Z880 Allergy status to penicillin: Secondary | ICD-10-CM | POA: Diagnosis not present

## 2022-07-07 DIAGNOSIS — O10013 Pre-existing essential hypertension complicating pregnancy, third trimester: Secondary | ICD-10-CM | POA: Diagnosis present

## 2022-07-07 DIAGNOSIS — O24410 Gestational diabetes mellitus in pregnancy, diet controlled: Secondary | ICD-10-CM | POA: Diagnosis present

## 2022-07-07 DIAGNOSIS — O4413 Placenta previa with hemorrhage, third trimester: Secondary | ICD-10-CM | POA: Diagnosis present

## 2022-07-07 DIAGNOSIS — O24420 Gestational diabetes mellitus in childbirth, diet controlled: Secondary | ICD-10-CM | POA: Diagnosis not present

## 2022-07-07 DIAGNOSIS — O321XX0 Maternal care for breech presentation, not applicable or unspecified: Secondary | ICD-10-CM | POA: Diagnosis present

## 2022-07-07 DIAGNOSIS — O4403 Placenta previa specified as without hemorrhage, third trimester: Secondary | ICD-10-CM | POA: Diagnosis not present

## 2022-07-07 DIAGNOSIS — O09523 Supervision of elderly multigravida, third trimester: Secondary | ICD-10-CM | POA: Diagnosis not present

## 2022-07-07 DIAGNOSIS — O3663X0 Maternal care for excessive fetal growth, third trimester, not applicable or unspecified: Secondary | ICD-10-CM | POA: Diagnosis present

## 2022-07-07 LAB — CBC
HCT: 33.2 % — ABNORMAL LOW (ref 36.0–46.0)
Hemoglobin: 11.2 g/dL — ABNORMAL LOW (ref 12.0–15.0)
MCH: 30.4 pg (ref 26.0–34.0)
MCHC: 33.7 g/dL (ref 30.0–36.0)
MCV: 90 fL (ref 80.0–100.0)
Platelets: 295 10*3/uL (ref 150–400)
RBC: 3.69 MIL/uL — ABNORMAL LOW (ref 3.87–5.11)
RDW: 13.8 % (ref 11.5–15.5)
WBC: 24.4 10*3/uL — ABNORMAL HIGH (ref 4.0–10.5)
nRBC: 0 % (ref 0.0–0.2)

## 2022-07-07 LAB — URINE DRUG SCREEN, QUALITATIVE (ARMC ONLY)
Amphetamines, Ur Screen: NOT DETECTED
Barbiturates, Ur Screen: NOT DETECTED
Benzodiazepine, Ur Scrn: NOT DETECTED
Cannabinoid 50 Ng, Ur ~~LOC~~: POSITIVE — AB
Cocaine Metabolite,Ur ~~LOC~~: NOT DETECTED
MDMA (Ecstasy)Ur Screen: NOT DETECTED
Methadone Scn, Ur: NOT DETECTED
Opiate, Ur Screen: NOT DETECTED
Phencyclidine (PCP) Ur S: NOT DETECTED
Tricyclic, Ur Screen: NOT DETECTED

## 2022-07-07 LAB — URINALYSIS, COMPLETE (UACMP) WITH MICROSCOPIC
RBC / HPF: >50 RBC/hpf — ABNORMAL HIGH (ref 0–5)
Specific Gravity, Urine: 1.009 (ref 1.005–1.030)

## 2022-07-07 LAB — CBC WITH DIFFERENTIAL/PLATELET
Abs Immature Granulocytes: 0.13 10*3/uL — ABNORMAL HIGH (ref 0.00–0.07)
Basophils Absolute: 0 10*3/uL (ref 0.0–0.1)
Basophils Relative: 0 %
Eosinophils Absolute: 0.4 10*3/uL (ref 0.0–0.5)
Eosinophils Relative: 2 %
HCT: 32.1 % — ABNORMAL LOW (ref 36.0–46.0)
Hemoglobin: 10.9 g/dL — ABNORMAL LOW (ref 12.0–15.0)
Immature Granulocytes: 1 %
Lymphocytes Relative: 15 %
Lymphs Abs: 2.8 10*3/uL (ref 0.7–4.0)
MCH: 30.4 pg (ref 26.0–34.0)
MCHC: 34 g/dL (ref 30.0–36.0)
MCV: 89.7 fL (ref 80.0–100.0)
Monocytes Absolute: 1 10*3/uL (ref 0.1–1.0)
Monocytes Relative: 5 %
Neutro Abs: 15.1 10*3/uL — ABNORMAL HIGH (ref 1.7–7.7)
Neutrophils Relative %: 77 %
Platelets: 275 10*3/uL (ref 150–400)
RBC: 3.58 MIL/uL — ABNORMAL LOW (ref 3.87–5.11)
RDW: 13.8 % (ref 11.5–15.5)
WBC: 19.3 10*3/uL — ABNORMAL HIGH (ref 4.0–10.5)
nRBC: 0 % (ref 0.0–0.2)

## 2022-07-07 LAB — ABO/RH: ABO/RH(D): A POS

## 2022-07-07 LAB — GROUP B STREP BY PCR: Group B strep by PCR: NEGATIVE

## 2022-07-07 LAB — GLUCOSE, CAPILLARY: Glucose-Capillary: 114 mg/dL — ABNORMAL HIGH (ref 70–99)

## 2022-07-07 LAB — CHLAMYDIA/NGC RT PCR (ARMC ONLY)
Chlamydia Tr: NOT DETECTED
N gonorrhoeae: NOT DETECTED

## 2022-07-07 MED ORDER — CEFAZOLIN SODIUM-DEXTROSE 2-4 GM/100ML-% IV SOLN
2.0000 g | Freq: Three times a day (TID) | INTRAVENOUS | Status: AC
  Administered 2022-07-07 – 2022-07-09 (×6): 2 g via INTRAVENOUS
  Filled 2022-07-07 (×7): qty 100

## 2022-07-07 MED ORDER — MAGNESIUM SULFATE 40 GM/1000ML IV SOLN
2.0000 g/h | INTRAVENOUS | Status: DC
  Administered 2022-07-07: 2 g/h via INTRAVENOUS
  Filled 2022-07-07: qty 1000

## 2022-07-07 MED ORDER — MAGNESIUM SULFATE BOLUS VIA INFUSION
4.0000 g | Freq: Once | INTRAVENOUS | Status: AC
  Administered 2022-07-07: 4 g via INTRAVENOUS
  Filled 2022-07-07: qty 1000

## 2022-07-07 MED ORDER — LABETALOL HCL 200 MG PO TABS
200.0000 mg | ORAL_TABLET | Freq: Two times a day (BID) | ORAL | Status: DC
  Administered 2022-07-07 – 2022-07-10 (×7): 200 mg via ORAL
  Filled 2022-07-07: qty 1
  Filled 2022-07-07: qty 2
  Filled 2022-07-07 (×2): qty 1
  Filled 2022-07-07: qty 2
  Filled 2022-07-07: qty 1
  Filled 2022-07-07: qty 2

## 2022-07-07 MED ORDER — ACETAMINOPHEN 500 MG PO TABS
1000.0000 mg | ORAL_TABLET | Freq: Four times a day (QID) | ORAL | Status: DC | PRN
  Administered 2022-07-07: 1000 mg via ORAL

## 2022-07-07 MED ORDER — HYDROXYZINE HCL 25 MG PO TABS
50.0000 mg | ORAL_TABLET | Freq: Once | ORAL | Status: AC
  Administered 2022-07-08: 50 mg via ORAL
  Filled 2022-07-07: qty 2

## 2022-07-07 MED ORDER — ACETAMINOPHEN 500 MG PO TABS
ORAL_TABLET | ORAL | Status: AC
  Filled 2022-07-07: qty 2

## 2022-07-07 MED ORDER — SODIUM CHLORIDE 0.9 % IV SOLN
500.0000 mg | INTRAVENOUS | Status: AC
  Administered 2022-07-07 – 2022-07-08 (×2): 500 mg via INTRAVENOUS
  Filled 2022-07-07 (×2): qty 5

## 2022-07-07 MED ORDER — LACTATED RINGERS IV SOLN: INTRAVENOUS | Status: DC

## 2022-07-07 MED ORDER — VALACYCLOVIR HCL 500 MG PO TABS
500.0000 mg | ORAL_TABLET | Freq: Two times a day (BID) | ORAL | Status: DC
  Administered 2022-07-07 – 2022-07-10 (×7): 500 mg via ORAL
  Filled 2022-07-07 (×7): qty 1

## 2022-07-07 MED ORDER — SODIUM CHLORIDE 0.9% IV SOLUTION: Freq: Once | INTRAVENOUS | Status: DC

## 2022-07-07 NOTE — Progress Notes (Signed)
Brandi [name] is a 36 y.o. G10P1001 female at 25w3ddated by an 8 week ultrasound who presented to triage for vaginal bleeding.  Her pregnancy has been complicated by AMA, cHTN and appears to be on labetalol, obesity with BMI 472 GDMA1 diagnosed at 12 weeks, history of HSV, and a previously  diagnosed anterior complete placenta previa.    She presented for active bleeding at about 2230 last night, with onset of bleeding at 2130. The initial bleeding was light and at the time of presentation this was much heavier.  She had a pelvic exam performed by the CNM by the time I was notified of her arrival that showed some clots in her vagina and evidence of some heavy bleeding.  Her vital signs have been normal, the fetal tracing has been category 1 the entire time.  Since that time her bleeding tapered to nothing until she passed a clot around 0530 this morning.  She has been stable since then.    An ultrasound was obtained that showed the fetus in breech presentation, an AFI of 18 cm, and ongoing complete placenta previa.  Given that her bleeding was tapering off and the fetal tracing has looked reassuring, she has been monitored.    She has been given betamethasone, she was started on magnesium for fetal neuroprotection, Her hemoglobin did drop by about 1 point since a lab draw 10 day ago.    She had a positive ROM plus. This was tested given the watery nature of the bleeding noted on exam.  However, her AFI is 18 cm. So, this is in some doubt.  Because after her bleeding stopped she has not continued to have clear fluid discharge once her bleeding stopped, to this point she has not been started on latency antibiotics.  I would have a low threshold to start her on latency antibiotics if she were to begin exhibiting leakage of fluid or she had a significant drop in her AFI on re-imaging.    The overall plan would be to monitor her bleeding, give 12 hours of magnesium, give her two doses of betamethasone.  We  will monitor the baby for any signs of distress or hypoxia.  Once she has demonstrated significant clinical stability, she would then be a candidate for outpatient monitoring.  Delivery would be reserved for maternal and fetal indications at this point.  NICU is aware.  I would make sure anesthesia is aware in case of the need for an emergency c-section (mode of delivery of choice for her).   She has been followed by MFM given her multiple medical problems complicating her pregnancy, as well as her placenta previa.  I reviewed each of the MFM ultrasound reports (from 04/18/22, 05/17/22, and 06/14/22) and none mention placenta accreta for which she would be at increased risk, given her anterior placenta previa (even without a history of cesarean delivery).  She had a positive THC screen on UDS without other drugs noted as positive.  She did have an abnormal pap smear this pregnancy with an LGSIL pap smear with positive HPV 18/45.  Her previous pap smear that I could find in 2018 was negative. She had a colposcopy on 04/17/2022 that showed mild AW changes without biopsy performed.  Her medical records indicate a history of abnormal pap smear in 2017.  She also has a history in her medical records of chlamydia and trichomonas. She has been tested for chlamydia this pregnancy and this was negative.    She should  be typed and cross-matched in case of the need for an emergent blood transfusion.    Will continue to monitor carefully.   Prentice Docker, MD, [facility] 07/07/2022 7:58 AM

## 2022-07-07 NOTE — Progress Notes (Signed)
Patient ID: Brandi [name], female   DOB: Nov 30, 1985, 36 y.o.   MRN: [id number] Undated on this patient by CNM and RN . 30+4 Complete previa with 2 episodes of bleeding , last 0530 .  PPROM ( abx not started yet) Breech Currently on Magnesium and she has received 1 betamethasone  EFM 130 + accels , no decels , no CTX  A; placenta previa with PPROM  P: goal to get second steroid injection  . Start ABX ( ancef (PCN allergy) and azithromycin )  Will have peds and anesthesiology talk to patient knowing that emergent delivery Via c/s may be required if active bleeding starts  She now has a 18 g Iv  Blood cross matched  Repeat CBC this am

## 2022-07-07 NOTE — Progress Notes (Addendum)
ANTEPARTUM PROGRESS NOTE  Brandi [name] is a 36 y.o. G2P1001 at 17w3dwho is admitted for vaginal bleeding in 3rd trimester and possible pPROM.   Estimated Date of Delivery: 09/12/22  Length of Stay:  0 Days. Admitted 07/06/2022  Subjective: Denies cramping or contractions, reports small amount of spotting but no large gushes   She reports:  -active fetal movement -no leakage of fluid -no vaginal bleeding -no contractions  Vitals:  BP 137/75   Pulse 87   Temp 98.1 F (36.7 C) (Oral)   Resp 16   Ht '5\' 5"'$  (1.651 m)   Wt 111.1 kg   LMP 11/30/2021   BMI 40.77 kg/m  Physical Examination: General:   alert, cooperative, and no distress  Skin:  normal  Neurologic:    Alert & oriented x 3  Lungs:    Normal effort  Abdomen:   Gravid, non-tender  Pelvis:  Exam deferred.  FHT:  125 BPM  Presentations: breech  Extremities: : non-tender, symmetric, no edema bilaterally.  DTRs: 2+/2+    Fetal monitoring: FHR: 125 bpm, Variability: moderate, Accelerations: Present, Decelerations: Absent  Uterine activity: quiet   Results for orders placed or performed during the hospital encounter of 07/06/22 (from the past 48 hour(s))  Wet prep, genital     Status: Abnormal   Collection Time: 07/06/22 11:11 PM  Result Value Ref Range   Yeast Wet Prep HPF POC NONE SEEN NONE SEEN   Trich, Wet Prep NONE SEEN NONE SEEN   Clue Cells Wet Prep HPF POC NONE SEEN NONE SEEN   WBC, Wet Prep HPF POC <10 (A) <10   Sperm NONE SEEN     Comment: [facility] [address] [place] [id number] [facility]     Status: None   Collection Time: 07/06/22 11:11 PM  Result Value Ref Range   Rom Plus POSITIVE     Comment: SIGNIFICANT BLOOD NOTED ON SWAB [facility] [address] [place] [address]  Type and screen [place]    Status: None   Collection Time: 07/06/22 11:41 PM  Result Value Ref Range   ABO/RH(D)  A POS    Antibody Screen NEG    Sample Expiration      07/09/2022,2359 Performed at [facility] [address] [place] [address]  Urine Drug Screen, Qualitative ([place])     Status: Abnormal   Collection Time: 07/06/22 11:41 PM  Result Value Ref Range   Tricyclic, Ur Screen NONE DETECTED NONE DETECTED   Amphetamines, Ur Screen NONE DETECTED NONE DETECTED   MDMA (Ecstasy)Ur Screen NONE DETECTED NONE DETECTED   Cocaine Metabolite,Ur [place] NONE DETECTED NONE DETECTED   Opiate, Ur Screen NONE DETECTED NONE DETECTED   Phencyclidine (PCP) Ur S NONE DETECTED NONE DETECTED   Cannabinoid 50 Ng, Ur [place] POSITIVE (A) NONE DETECTED   Barbiturates, Ur Screen NONE DETECTED NONE DETECTED   Benzodiazepine, Ur Scrn NONE DETECTED NONE DETECTED   Methadone Scn, Ur NONE DETECTED NONE DETECTED    Comment: (NOTE) Tricyclics + metabolites, urine    Cutoff 1000 ng/mL Amphetamines + metabolites, urine  Cutoff 1000 ng/mL MDMA (Ecstasy), urine              Cutoff 500 ng/mL Cocaine Metabolite, urine          Cutoff 300 ng/mL Opiate + metabolites, urine  Cutoff 300 ng/mL Phencyclidine (PCP), urine         Cutoff 25 ng/mL Cannabinoid, urine                 Cutoff 50 ng/mL Barbiturates + metabolites, urine  Cutoff 200 ng/mL Benzodiazepine, urine              Cutoff 200 ng/mL Methadone, urine                   Cutoff 300 ng/mL  The urine drug screen provides only a preliminary, unconfirmed analytical test result and should not be used for non-medical purposes. Clinical consideration and professional judgment should be applied to any positive drug screen result due to possible interfering substances. A more specific alternate chemical method must be used in order to obtain a confirmed analytical result. Gas chromatography / mass spectrometry (GC/MS) is the preferred confirm atory method. Performed at [facility], [address] [place], [place] [address]   CBC with  Differential/Platelet     Status: Abnormal   Collection Time: 07/06/22 11:41 PM  Result Value Ref Range   WBC 19.3 (H) 4.0 - 10.5 K/uL   RBC 3.58 (L) 3.87 - 5.11 MIL/uL   Hemoglobin 10.9 (L) 12.0 - 15.0 g/dL   HCT 32.1 (L) 36.0 - 46.0 %   MCV 89.7 80.0 - 100.0 fL   MCH 30.4 26.0 - 34.0 pg   MCHC 34.0 30.0 - 36.0 g/dL   RDW 13.8 11.5 - 15.5 %   Platelets 275 150 - 400 K/uL   nRBC 0.0 0.0 - 0.2 %   Neutrophils Relative % 77 %   Neutro Abs 15.1 (H) 1.7 - 7.7 K/uL   Lymphocytes Relative 15 %   Lymphs Abs 2.8 0.7 - 4.0 K/uL   Monocytes Relative 5 %   Monocytes Absolute 1.0 0.1 - 1.0 K/uL   Eosinophils Relative 2 %   Eosinophils Absolute 0.4 0.0 - 0.5 K/uL   Basophils Relative 0 %   Basophils Absolute 0.0 0.0 - 0.1 K/uL   Immature Granulocytes 1 %   Abs Immature Granulocytes 0.13 (H) 0.00 - 0.07 K/uL    Comment: Performed at [facility], [address] [place], [place] [address]  Urinalysis, Complete w Microscopic     Status: Abnormal   Collection Time: 07/06/22 11:41 PM  Result Value Ref Range   Color, Urine RED (A) YELLOW    Comment: BIOCHEMICALS MAY BE AFFECTED BY COLOR   APPearance HAZY (A) CLEAR   Specific Gravity, Urine 1.009 1.005 - 1.030   pH  5.0 - 8.0    TEST NOT REPORTED DUE TO COLOR INTERFERENCE OF URINE PIGMENT   Glucose, UA (A) NEGATIVE mg/dL    TEST NOT REPORTED DUE TO COLOR INTERFERENCE OF URINE PIGMENT   Hgb urine dipstick (A) NEGATIVE    TEST NOT REPORTED DUE TO COLOR INTERFERENCE OF URINE PIGMENT   Bilirubin Urine (A) NEGATIVE    TEST NOT REPORTED DUE TO COLOR INTERFERENCE OF URINE PIGMENT   Ketones, ur (A) NEGATIVE mg/dL    TEST NOT REPORTED DUE TO COLOR INTERFERENCE OF URINE PIGMENT   Protein, ur (A) NEGATIVE mg/dL    TEST NOT REPORTED DUE TO COLOR INTERFERENCE OF URINE PIGMENT   Nitrite (A) NEGATIVE    TEST NOT REPORTED DUE TO COLOR INTERFERENCE OF URINE PIGMENT   Leukocytes,Ua (A) NEGATIVE    TEST NOT REPORTED DUE TO COLOR INTERFERENCE OF  URINE PIGMENT   RBC /  HPF >50 (H) 0 - 5 RBC/hpf   WBC, UA 0-5 0 - 5 WBC/hpf   Bacteria, UA RARE (A) NONE SEEN   Squamous Epithelial / LPF 11-20 0 - 5    Comment: Performed at [facility], [address] [place], [place] [address]    [employer] [employer]  Result Date: 07/07/2022 CLINICAL DATA:  Vaginal bleeding. EXAM: LIMITED OBSTETRIC ULTRASOUND COMPARISON:  June 14, 2022 FINDINGS: Number of Fetuses: 1 Heart Rate:  133 bpm Movement: Yes Presentation: Breech Placental Location: Anterior Previa: Complete Amniotic Fluid (Subjective):  Within normal limits. AFI: 18.0 cm BPD: 7.3 cm 31 w  for d MATERNAL FINDINGS: Cervix:  Appears closed (3.8 cm in length). Uterus/Adnexae: No abnormality visualized. IMPRESSION: 1. Single, viable intrauterine pregnancy at approximately 31 weeks and 4 days gestation by ultrasound evaluation. 2. Complete placenta previa. This exam is performed on an emergent basis and does not comprehensively evaluate fetal size, dating, or anatomy; follow-up complete OB US should be considered if further fetal assessment is warranted. Electronically Signed   By: Virgina Norfolk M.D.   On: 07/07/2022 00:05    Current scheduled medications  sodium chloride   Intravenous Once   betamethasone acetate-betamethasone sodium phosphate  12 mg Intramuscular Q24 Hr x 2   labetalol  200 mg Oral BID   valACYclovir  500 mg Oral BID    I have reviewed the patient's current medications.  ASSESSMENT: Patient Active Problem List   Diagnosis Date Noted   Vaginal bleeding in pregnancy, third trimester 07/06/2022   LGA (large for gestational age) fetus affecting management of mother 05/17/2022   Placenta previa, anterior 04/19/2022   GDM diagnosed at 12 weeks 03/03/2022   LGSIL on Pap smear of cervix 02/28/2022   BMI 40.0-44.9, adult ([place]) 02/22/2022   Chronic hypertension affecting pregnancy 02/22/2022   Multigravida of advanced maternal age 55/22/2023   Medication exposure during  first trimester of pregnancy 02/22/2022   Supervision of high risk pregnancy, antepartum 01/31/2022   Mixed hyperlipidemia 07/20/2021   Maternal morbid obesity, antepartum ([place]) 07/20/2021   Genital herpes 02/03/2021   Chronic lower back pain 05/30/2020   History of migraine 05/30/2020   Chronic pain of both knees 05/30/2020   Irritable bowel syndrome with diarrhea 10/22/2018   Essential hypertension 10/22/2018   Recurrent major depressive disorder, in partial remission ([place]) 10/22/2018   Gastroesophageal reflux disease 10/22/2018    PLAN: 1) High risk antepartum  -Discussed plan of care with Dr. Ouida Sills  -Continue routine antepartum care  -Continuous EFM while on mag sulfate infusion  -Plan for daily NST   2) Vaginal bleeding in 3rd trimester -Pregnancy complicated by anterior placenta previa -Bleeding has improved, continues to have small amount of spotting but denies large gushes  -IV access with large bore IV in place  -Type cross matched for 2 units pRBC -Anesthesia notified of admission -NPO and advanced to clear liquids as long as bleeding remains minimal   3) pPROM -Watery discharge noted during pelvic exam along with positive ROM + -She reports small gushes of watery, blood tinged discharge but none in the past couple of hours  -AFI last night 18 cm -Discussed of possible pPROM versus  -Will start latency antibiotics at this time since she has had several episodes of small gushes and ROM + positive.   -Plan to repeat AFI tomorrow  -GBS PCR and GC/CT ordered  -Wet prep negative  -Mag sulfate infusion for 12 hours - due to be d/c at  1400  -S/P 1 dose of betamethasone - 2nd dose due at 2330 tonight   3) Chronic hypertension  -BP's normal with occasional mild range  -Continue Labetalol 200 mg BID   4) A1GDM -Advancing to clear liquids for now -Will check CBG -Fasting and 2 hours PP when able to eat regular diet   Drinda Butts, CNM Certified Nurse  Midwife [facility]

## 2022-07-07 NOTE — Consult Note (Addendum)
.  Consultation Service: Neonatology   Drinda Butts, CNM, has asked for consultation on MAKINSLEY SCHIAVI regarding the care of a premature infant at 30+3 wga. Thank you for inviting [place] to see this patient.   Reason for consult:  Explain the possible complications, the prognosis, and the care of a premature infant at 30+3 wga.  I have reviewed the patient's chart and have met with her. The salient information is as follows:   36 y.o. female with a single IUP with an unk efw, at 30+3 wga dated by 1tm ultrasound.  Pregnancy has been complicated by AMA, cHTN and appears to be on labetalol, obesity with BMI 40, GDMA1 diagnosed at 12 weeks, history of HSV (no symptoms, on Ppx), and anterior complete placenta previa.     She presented to triage with active vaginal bleeding on 11/3- which has since resolved.  She was found to be ruptured as well, but with normal AFI.  Prenatal labs reviewed. She has received BMZ, magnesium, and azithromycin.  The fetal tracing remains reassuring. She is not contracting.  Plan is for ongoing monitoring at this time, potential outpatient monitoring once clinical stability achieved, and delivery via c/s should delivery be indicated.    Prenatal labs: GCC- negative RPR NR GBS neg 11/4 HCV neg HIV neg Hep b s ag neg 6/23 MBT A+ ab neg   Prenatal care:   good Pregnancy complications:  See above Maternal antibiotics:  azithromycin  Maternal Steroids: BMZ first dose 11/3 @ 2328 Most recent dose:  11/3 @ 2328   My recommendations for this patient and my actions included:   1. In the presence of the Alechia M Weyand  , I spent 25 minutes discussing the possible complications and outcomes of prematurity at this gestational age. I discussed specific complications at this gestational age referencing the need for resuscitation at birth due to respiratory distress which may require mechanical ventilation, CPAP, and surfactant administration. In addition infant may require IV  fluids pending establishment of enteral feeds (encouraged breast milk feeding), antibiotics for possible sepsis, temperature support, and continuous monitoring. I also discussed the potential risk of complications such as intracranial hemorrhage, retinopathy, hearing deficit, and chronic lung disease. I discussed this with parents in detail and they expressed an understanding of the risks and complications of prematurity.   2. I also discussed the expected survival of an infant born at 30+3, which is very good.  of the neonates born at this age, few have profound or severe neurological complications and school difficulties. In addition, few of the neonates born at this age will have some for of mild to moderate neurological complications. She expressed an understanding of this information.   3. I informed her that the NICU team would be present at the delivery. She agreed that all appropriate medical measures could be taken to resuscitate her infant at the delivery. She also understood that our team will always be available for any questions that come up during their infant's hospitalization and we will continue to partner with their family to support them through this difficult time. Visitation policy was discussed and all questions were addressed.   Final Impression:  36 y.o. female with a 30+3 wga single IUP who is presenting with active vaginal bleeding in setting of complete previa- and who now understands the possible complications and prognosis of her infant. The mother agrees with plan for resuscitation and ICU care. Oza Oberle Burgio's questions were answered. She is planning to try and  provide breast milk for her infant.    ______________________________________________________________________  Thank you for asking [place] to participate in the care of this patient. Please do not hesitate to contact us again if you are aware of any further ways we can be of assistance.   Sincerely,  Denna Haggard, MD NICU Attending   I spent ~35 minutes in consultation time, of which 15 minutes was spent in direct face to face counseling.

## 2022-07-08 ENCOUNTER — Inpatient Hospital Stay: Payer: BC Managed Care – PPO

## 2022-07-08 DIAGNOSIS — H52229 Regular astigmatism, unspecified eye: Secondary | ICD-10-CM | POA: Insufficient documentation

## 2022-07-08 LAB — CBC
HCT: 30.4 % — ABNORMAL LOW (ref 36.0–46.0)
Hemoglobin: 10.3 g/dL — ABNORMAL LOW (ref 12.0–15.0)
MCH: 30.7 pg (ref 26.0–34.0)
MCHC: 33.9 g/dL (ref 30.0–36.0)
MCV: 90.7 fL (ref 80.0–100.0)
Platelets: 276 10*3/uL (ref 150–400)
RBC: 3.35 MIL/uL — ABNORMAL LOW (ref 3.87–5.11)
RDW: 14.2 % (ref 11.5–15.5)
WBC: 23.5 10*3/uL — ABNORMAL HIGH (ref 4.0–10.5)
nRBC: 0 % (ref 0.0–0.2)

## 2022-07-08 LAB — GLUCOSE, CAPILLARY: Glucose-Capillary: 163 mg/dL — ABNORMAL HIGH (ref 70–99)

## 2022-07-08 MED ORDER — PANTOPRAZOLE SODIUM 40 MG PO TBEC
40.0000 mg | DELAYED_RELEASE_TABLET | Freq: Every day | ORAL | Status: DC
  Administered 2022-07-08 – 2022-07-10 (×3): 40 mg via ORAL
  Filled 2022-07-08 (×3): qty 1

## 2022-07-08 MED ORDER — HYDROXYZINE HCL 25 MG PO TABS: 50.0000 mg | ORAL_TABLET | Freq: Every evening | ORAL | Status: DC | PRN

## 2022-07-08 MED ORDER — SODIUM CHLORIDE 0.9 % IV SOLN: INTRAVENOUS | Status: DC | PRN

## 2022-07-08 NOTE — Progress Notes (Signed)
Alert and oriented with pleasant affect. Color good, skin w&d. BBS clear. Positive pedal pulses equal and strong with cap. Refill < 2 seconds. Abdomen soft to palpation. Fetal Heart tone by Doppler is 138 and Pt. States positive fetal movement. Pad with 2cc of Sanguinous discharge. Instructed Pt. To notify RN if bleeding increases and to save Peri Pads so that they can be weighed. Pt. V/o. Instructed Pt. To notify RN of any Abdominal pain, cramping and/or contractions.  Also, instructed Pt. To notify RN of decrease in Fetal movement and she v/o. Pt. V/O. Will cont. To monitor Pt. Closely.

## 2022-07-08 NOTE — Progress Notes (Signed)
Updated on pt overnight by CNM Mackie Pt has been stable with small amt of additional bleeding On pelvic exam by CNM small clotted blood in vault .  No evidence of additional LOF .  AFT 14 cm , but pt is off IVF .  Reassuring fetal monitoring without CTX . Second Betamethasone received , Magnesium has been stopped  A: Placental previa with continued small bleeding Possible LOF but afi stable and clinically no additional loss P:Continue observation given small bleeding continued  Continue ABX for now . If AFI tomorrow is still stable we will presume no LOF and stop ABX  Possible d/c back to [facility]

## 2022-07-08 NOTE — Progress Notes (Signed)
ANTEPARTUM PROGRESS NOTE  Brandi [name] is a 36 y.o. G2P1001 at 19w4dwho is admitted for vaginal bleeding in 3rd trimester and possible pPROM.   Estimated Date of Delivery: 09/12/22  Length of Stay:  1 Days. Admitted 07/06/2022  Subjective: Denies cramping or contractions, reports small amount of spotting but no large gushes    She reports:  -active fetal movement -no leakage of fluid -no significant vaginal bleeding -no contractions  Vitals:  BP 132/73 (BP Location: Left Arm)   Pulse 85   Temp 98.7 F (37.1 C) (Oral)   Resp 18   Ht '5\' 5"'$  (1.651 m)   Wt 111.1 kg   LMP 11/30/2021   BMI 40.77 kg/m  Physical Examination: General:   alert, cooperative, and no distress  Skin:  normal  Neurologic:    Alert & oriented x 3  Lungs:    Normal respiratory effort  Abdomen:   Soft, gravid, non-tender  Pelvis:  Vaginal: 2 golf ball size clots in vaginal vault, small amount dark red blood noted, no pooling noted  Cervix: small amount of dark, red blood   FHT:  135 BPM  Presentations: Transverse, head to maternal right  Cervix:    Dilation: Closed visually with speculum exam   Effacement: Long  Extremities: : non-tender, symmetric, No edema bilaterally.  DTRs: 2+/2+    Fetal monitoring: FHR: 135 bpm, Variability: moderate, Accelerations: Present, Decelerations: Absent  Uterine activity: quiet   Results for orders placed or performed during the hospital encounter of 07/06/22 (from the past 48 hour(s))  Wet prep, genital     Status: Abnormal   Collection Time: 07/06/22 11:11 PM   Specimen: Vaginal/Rectal  Result Value Ref Range   Yeast Wet Prep HPF POC NONE SEEN NONE SEEN   Trich, Wet Prep NONE SEEN NONE SEEN   Clue Cells Wet Prep HPF POC NONE SEEN NONE SEEN   WBC, Wet Prep HPF POC <10 (A) <10   Sperm NONE SEEN     Comment: [facility] [address], [place] [id number] ROM Plus ([facility]     Status: None   Collection Time: 07/06/22 11:11  PM  Result Value Ref Range   Rom Plus POSITIVE     Comment: SIGNIFICANT BLOOD NOTED ON SWAB [facility] [address], [place] [address]  Type and screen [place]    Status: None (Preliminary result)   Collection Time: 07/06/22 11:41 PM  Result Value Ref Range   ABO/RH(D) A POS    Antibody Screen NEG    Sample Expiration 07/09/2022,2359    Unit Number [id number]   Blood Component Type RED CELLS,LR    Unit division 00    Status of Unit ALLOCATED    Transfusion Status OK TO TRANSFUSE    Crossmatch Result      Compatible [facility]formed at [facility] [address], [place] [address]   Unit Number [id number]   Blood Component Type RED CELLS,LR    Unit division 00    Status of Unit ALLOCATED    Transfusion Status OK TO TRANSFUSE    Crossmatch Result Compatible   Urine Drug Screen, Qualitative ([facility] only)     Status: Abnormal   Collection Time: 07/06/22 11:41 PM  Result Value Ref Range   Tricyclic, Ur Screen NONE DETECTED NONE DETECTED   Amphetamines, Ur Screen NONE DETECTED NONE DETECTED   MDMA (  Ecstasy)Ur Screen NONE DETECTED NONE DETECTED   Cocaine Metabolite,Ur [place] NONE DETECTED NONE DETECTED   Opiate, Ur Screen NONE DETECTED NONE DETECTED   Phencyclidine (PCP) Ur S NONE DETECTED NONE DETECTED   Cannabinoid 50 Ng, Ur [place] POSITIVE (A) NONE DETECTED   Barbiturates, Ur Screen NONE DETECTED NONE DETECTED   Benzodiazepine, Ur Scrn NONE DETECTED NONE DETECTED   Methadone Scn, Ur NONE DETECTED NONE DETECTED    Comment: (NOTE) Tricyclics + metabolites, urine    Cutoff 1000 ng/mL Amphetamines + metabolites, urine  Cutoff 1000 ng/mL MDMA (Ecstasy), urine              Cutoff 500 ng/mL Cocaine Metabolite, urine          Cutoff 300 ng/mL Opiate + metabolites, urine        Cutoff 300 ng/mL Phencyclidine (PCP), urine         Cutoff 25 ng/mL Cannabinoid, urine                 Cutoff 50  ng/mL Barbiturates + metabolites, urine  Cutoff 200 ng/mL Benzodiazepine, urine              Cutoff 200 ng/mL Methadone, urine                   Cutoff 300 ng/mL  The urine drug screen provides only a preliminary, unconfirmed analytical test result and should not be used for non-medical purposes. Clinical consideration and professional judgment should be applied to any positive drug screen result due to possible interfering substances. A more specific alternate chemical method must be used in order to obtain a confirmed analytical result. Gas chromatography / mass spectrometry (GC/MS) is the preferred confirm atory method. Performed at [facility], [address] [place], [place] [address]   CBC with Differential/Platelet     Status: Abnormal   Collection Time: 07/06/22 11:41 PM  Result Value Ref Range   WBC 19.3 (H) 4.0 - 10.5 K/uL   RBC 3.58 (L) 3.87 - 5.11 MIL/uL   Hemoglobin 10.9 (L) 12.0 - 15.0 g/dL   HCT 32.1 (L) 36.0 - 46.0 %   MCV 89.7 80.0 - 100.0 fL   MCH 30.4 26.0 - 34.0 pg   MCHC 34.0 30.0 - 36.0 g/dL   RDW 13.8 11.5 - 15.5 %   Platelets 275 150 - 400 K/uL   nRBC 0.0 0.0 - 0.2 %   Neutrophils Relative % 77 %   Neutro Abs 15.1 (H) 1.7 - 7.7 K/uL   Lymphocytes Relative 15 %   Lymphs Abs 2.8 0.7 - 4.0 K/uL   Monocytes Relative 5 %   Monocytes Absolute 1.0 0.1 - 1.0 K/uL   Eosinophils Relative 2 %   Eosinophils Absolute 0.4 0.0 - 0.5 K/uL   Basophils Relative 0 %   Basophils Absolute 0.0 0.0 - 0.1 K/uL   Immature Granulocytes 1 %   Abs Immature Granulocytes 0.13 (H) 0.00 - 0.07 K/uL    Comment: Performed at [facility], [address] [place], [place] [address]  Urinalysis, Complete w Microscopic     Status: Abnormal   Collection Time: 07/06/22 11:41 PM  Result Value Ref Range   Color, Urine RED (A) YELLOW    Comment: BIOCHEMICALS MAY BE AFFECTED BY COLOR   APPearance HAZY (A) CLEAR   Specific Gravity, Urine 1.009 1.005 - 1.030   pH   5.0 - 8.0    TEST NOT REPORTED DUE TO COLOR INTERFERENCE OF  URINE PIGMENT   Glucose, UA (A) NEGATIVE mg/dL    TEST NOT REPORTED DUE TO COLOR INTERFERENCE OF URINE PIGMENT   Hgb urine dipstick (A) NEGATIVE    TEST NOT REPORTED DUE TO COLOR INTERFERENCE OF URINE PIGMENT   Bilirubin Urine (A) NEGATIVE    TEST NOT REPORTED DUE TO COLOR INTERFERENCE OF URINE PIGMENT   Ketones, ur (A) NEGATIVE mg/dL    TEST NOT REPORTED DUE TO COLOR INTERFERENCE OF URINE PIGMENT   Protein, ur (A) NEGATIVE mg/dL    TEST NOT REPORTED DUE TO COLOR INTERFERENCE OF URINE PIGMENT   Nitrite (A) NEGATIVE    TEST NOT REPORTED DUE TO COLOR INTERFERENCE OF URINE PIGMENT   Leukocytes,Ua (A) NEGATIVE    TEST NOT REPORTED DUE TO COLOR INTERFERENCE OF URINE PIGMENT   RBC / HPF >50 (H) 0 - 5 RBC/hpf   WBC, UA 0-5 0 - 5 WBC/hpf   Bacteria, UA RARE (A) NONE SEEN   Squamous Epithelial / LPF 11-20 0 - 5    Comment: Performed at [facility], [address] [place], [place] [address]  Prepare RBC (crossmatch)     Status: None   Collection Time: 07/07/22  9:52 AM  Result Value Ref Range   Order Confirmation      ORDER PROCESSED BY BLOOD BANK Performed at [facility], [address] [place], [place] [address]   CBC     Status: Abnormal   Collection Time: 07/07/22 10:12 AM  Result Value Ref Range   WBC 24.4 (H) 4.0 - 10.5 K/uL   RBC 3.69 (L) 3.87 - 5.11 MIL/uL   Hemoglobin 11.2 (L) 12.0 - 15.0 g/dL   HCT 33.2 (L) 36.0 - 46.0 %   MCV 90.0 80.0 - 100.0 fL   MCH 30.4 26.0 - 34.0 pg   MCHC 33.7 30.0 - 36.0 g/dL   RDW 13.8 11.5 - 15.5 %   Platelets 295 150 - 400 K/uL   nRBC 0.0 0.0 - 0.2 %    Comment: Performed at [facility], [address] [place], [place] [address]  ABO/Rh     Status: None   Collection Time: 07/07/22 10:12 AM  Result Value Ref Range   ABO/RH(D)      A POS Performed at [facility], [address] [place], [place] [address]   Group B strep by PCR      Status: None   Collection Time: 07/07/22 10:29 AM   Specimen: Vaginal/Rectal; Genital  Result Value Ref Range   Group B strep by PCR NEGATIVE NEGATIVE    Comment: (NOTE) Intrapartum testing with Xpert GBS assay should be used as an adjunct to other methods available and not used to replace antepartum testing (at 35-[redacted] weeks gestation). Performed at [facility], [address] [place], [place] [address]   [place] rt PCR [facility] only)     Status: None   Collection Time: 07/07/22 11:15 AM  Result Value Ref Range   Specimen source GC/Chlam URINE, RANDOM     Comment: CORRECTED ON 11/04 AT 1208: PREVIOUSLY REPORTED AS ENDOCERVICAL   Chlamydia Tr NOT DETECTED NOT DETECTED   N gonorrhoeae NOT DETECTED NOT DETECTED    Comment: (NOTE) This CT/NG assay has not been evaluated in patients with a history of  hysterectomy. Performed at [facility], [address] [place], [place] [address]   Glucose, capillary     Status: Abnormal   Collection Time: 07/07/22  3:09 PM  Result Value Ref Range   Glucose-Capillary 114 (H) 70 - 99 mg/dL    Comment: Glucose reference range applies only to samples taken after fasting for at least 8 hours.  CBC     Status: Abnormal   Collection Time: 07/08/22  6:10 AM  Result Value Ref Range   WBC 23.5 (H) 4.0 - 10.5 K/uL   RBC 3.35 (L) 3.87 - 5.11 MIL/uL   Hemoglobin 10.3 (L) 12.0 - 15.0 g/dL   HCT 30.4 (L) 36.0 - 46.0 %   MCV 90.7 80.0 - 100.0 fL   MCH 30.7 26.0 - 34.0 pg   MCHC 33.9 30.0 - 36.0 g/dL   RDW 14.2 11.5 - 15.5 %   Platelets 276 150 - 400 K/uL   nRBC 0.0 0.0 - 0.2 %    Comment: Performed at [facility], [address] [place], [place] [address]  Glucose, capillary     Status: Abnormal   Collection Time: 07/08/22  8:18 AM  Result Value Ref Range   Glucose-Capillary 163 (H) 70 - 99 mg/dL    Comment: Glucose reference range applies only to samples taken after fasting for at least 8 hours.    US OB  Limited  Result Date: 07/08/2022 CLINICAL DATA:  Assigned a previa.  Premature rupture of membranes EXAM: LIMITED OBSTETRIC ULTRASOUND COMPARISON:  07/06/2022 FINDINGS: Number of Fetuses: 1 Heart Rate:  138 bpm Movement: Yes Presentation: Transverse, head maternal right Placental Location: Anterior Previa: Complete Amniotic Fluid (Subjective):  Within normal limits. AFI: 14.8 cm BPD: 7.94 cm 31 w  6 d MATERNAL FINDINGS: Cervix:  Appears closed.  Length: 5.8 cm. Uterus/Adnexae: No abnormality visualized. IMPRESSION: 1. Single live intrauterine gestation in transverse presentation. 2. Complete placenta previa. 3. Amniotic fluid index of 14.8 cm, within normal limits. 4. Cervix is closed measuring 5.8 cm in length. This exam is performed on an urgent basis and does not comprehensively evaluate fetal size, dating, or anatomy; follow-up complete OB US should be considered if further fetal assessment is warranted. Electronically Signed   By: Davina Poke D.O.   On: 07/08/2022 10:47   US OB Limited  Result Date: 07/07/2022 CLINICAL DATA:  Vaginal bleeding. EXAM: LIMITED OBSTETRIC ULTRASOUND COMPARISON:  June 14, 2022 FINDINGS: Number of Fetuses: 1 Heart Rate:  133 bpm Movement: Yes Presentation: Breech Placental Location: Anterior Previa: Complete Amniotic Fluid (Subjective):  Within normal limits. AFI: 18.0 cm BPD: 7.3 cm 31 w  for d MATERNAL FINDINGS: Cervix:  Appears closed (3.8 cm in length). Uterus/Adnexae: No abnormality visualized. IMPRESSION: 1. Single, viable intrauterine pregnancy at approximately 31 weeks and 4 days gestation by ultrasound evaluation. 2. Complete placenta previa. This exam is performed on an emergent basis and does not comprehensively evaluate fetal size, dating, or anatomy; follow-up complete OB US should be considered if further fetal assessment is warranted. Electronically Signed   By: Virgina Norfolk M.D.   On: 07/07/2022 00:05    Current scheduled medications  sodium  chloride   Intravenous Once   labetalol  200 mg Oral BID   valACYclovir  500 mg Oral BID    I have reviewed the patient's current medications.  ASSESSMENT: Patient Active Problem List   Diagnosis Date Noted   Regular astigmatism 07/08/2022   Vaginal bleeding in pregnancy, third trimester 07/06/2022   LGA (large for gestational age) fetus affecting management of mother 05/17/2022   Placenta previa, anterior 04/19/2022   GDM diagnosed at 12 weeks 03/03/2022   LGSIL on Pap  smear of cervix 02/28/2022   BMI 40.0-44.9, adult ([place]) 02/22/2022   Chronic hypertension affecting pregnancy 02/22/2022   Multigravida of advanced maternal age 24/22/2023   Medication exposure during first trimester of pregnancy 02/22/2022   Supervision of high risk pregnancy, antepartum 01/31/2022   Mixed hyperlipidemia 07/20/2021   Maternal morbid obesity, antepartum ([place]) 07/20/2021   Genital herpes 02/03/2021   Chronic lower back pain 05/30/2020   History of migraine 05/30/2020   Chronic pain of both knees 05/30/2020   Irritable bowel syndrome with diarrhea 10/22/2018   Essential hypertension 10/22/2018   Recurrent major depressive disorder, in partial remission ([place]) 10/22/2018   Gastroesophageal reflux disease 10/22/2018    PLAN: 1) High risk antepartum  -Discussed plan of care with Dr. Ouida Sills  -Continue routine antepartum care  -Daily NST -May transfer to Mother/Baby for continued antepartum care    2) Vaginal bleeding in 3rd trimester -Pregnancy complicated by anterior placenta previa -Bleeding has improved, continues to have small amount of spotting but denies large gushes  -2 golf ball size clots removed this morning during pelvic exam  -Type cross matched for 2 units pRBC -Anesthesia notified of admission - consult completed yesterday  -NICU consult completed yesterday    3) Possible pPROM -Watery discharge noted during pelvic exam yesterday along with positive ROM + -She reported  small gushes of watery, blood tinged discharge has not had any gushes > 24 hours  -AFI yesterday was 18cm, this morning AFI was 14.8 cm -No pooling or watery discharge seen on speculum exam today  -Fern negative  -Discussed of possible pPROM versus false positive ROM + d/t contamination with blood.  False positive ROM + more likely given stable AFI and no continued LOF.   -Will continue latency antibiotics at this time since risk of antibiotics is lower than risk of infection associated with possible pPROM   -Plan to repeat AFI tomorrow  -GBS PCR and GC/CT Negative  -Wet prep negative  -Neuro-protection mag sulfate infusion completed  -S/P 2 doses of betamethasone - 2nd dose given at 2330 07/07/2022   3) Chronic hypertension  -BP's normal with occasional mild range  -Continue Labetalol 200 mg BID    4) A1GDM -Advancing to soft diet  -Fasting and 2 hours PP when able to eat regular diet   Drinda Butts, CNM Certified Nurse Midwife [facility]

## 2022-07-09 ENCOUNTER — Inpatient Hospital Stay: Payer: BC Managed Care – PPO

## 2022-07-09 LAB — CBC
HCT: 28 % — ABNORMAL LOW (ref 36.0–46.0)
Hemoglobin: 9.4 g/dL — ABNORMAL LOW (ref 12.0–15.0)
MCH: 30.6 pg (ref 26.0–34.0)
MCHC: 33.6 g/dL (ref 30.0–36.0)
MCV: 91.2 fL (ref 80.0–100.0)
Platelets: 261 10*3/uL (ref 150–400)
RBC: 3.07 MIL/uL — ABNORMAL LOW (ref 3.87–5.11)
RDW: 14.4 % (ref 11.5–15.5)
WBC: 22.5 10*3/uL — ABNORMAL HIGH (ref 4.0–10.5)
nRBC: 0 % (ref 0.0–0.2)

## 2022-07-09 MED ORDER — SODIUM CHLORIDE 0.9 % IV SOLN
300.0000 mg | Freq: Once | INTRAVENOUS | Status: AC
  Administered 2022-07-09: 300 mg via INTRAVENOUS
  Filled 2022-07-09: qty 300

## 2022-07-09 NOTE — Progress Notes (Signed)
Spoke with Dr. Annamaria Boots at [facility]

## 2022-07-09 NOTE — Progress Notes (Signed)
ANTEPARTUM PROGRESS NOTE  Brandi [name] is a 36 y.o. G2P1001 at 56w4dwho is admitted for vaginal bleeding in 3rd trimester and possible pPROM.   Estimated Date of Delivery: 09/12/22  Length of Stay:  2 Days. Admitted 07/06/2022  Subjective: Denies cramping or contractions, reports small amount of spotting but no large gushes    She reports:  -active fetal movement -no leakage of fluid -no significant vaginal bleeding -no contractions  Vitals:  BP 128/80 (BP Location: Right Arm)   Pulse 76   Temp 98.5 F (36.9 C) (Oral)   Resp 18   Ht '5\' 5"'$  (1.651 m)   Wt 111.1 kg   LMP 11/30/2021   SpO2 98%   BMI 40.77 kg/m  Physical Examination: General:   alert, cooperative, and no distress  Skin:  normal  Neurologic:    Alert & oriented x 3  Lungs:    Normal respiratory effort  Abdomen:   Soft, gravid, non-tender  Pelvis:  Deferred   Extremities: : non-tender, symmetric, No edema bilaterally.      NST  FHT: FHR: 130 bpm, variability: moderate,  accelerations:  Present,  decelerations:  Absent Category/reactivity:  Category I TOCO: none  Results for orders placed or performed during the hospital encounter of 07/06/22 (from the past 48 hour(s))  Chlamydia/NGC rt PCR ([facility] only)     Status: None   Collection Time: 07/07/22 11:15 AM  Result Value Ref Range   Specimen source GC/Chlam URINE, RANDOM     Comment: CORRECTED ON 11/04 AT 1208: PREVIOUSLY REPORTED AS ENDOCERVICAL   Chlamydia Tr NOT DETECTED NOT DETECTED   N gonorrhoeae NOT DETECTED NOT DETECTED    Comment: (NOTE) This CT/NG assay has not been evaluated in patients with a history of  hysterectomy. [facility]formed at [facility] [address], [place] [address]  Glucose, capillary     Status: Abnormal   Collection Time: 07/07/22  3:09 PM  Result Value Ref Range   Glucose-Capillary 114 (H) 70 - 99 mg/dL    Comment: Glucose reference range applies only to samples taken after fasting for at least 8  hours.  CBC     Status: Abnormal   Collection Time: 07/08/22  6:10 AM  Result Value Ref Range   WBC 23.5 (H) 4.0 - 10.5 K/uL   RBC 3.35 (L) 3.87 - 5.11 MIL/uL   Hemoglobin 10.3 (L) 12.0 - 15.0 g/dL   HCT 30.4 (L) 36.0 - 46.0 %   MCV 90.7 80.0 - 100.0 fL   MCH 30.7 26.0 - 34.0 pg   MCHC 33.9 30.0 - 36.0 g/dL   RDW 14.2 11.5 - 15.5 %   Platelets 276 150 - 400 K/uL   nRBC 0.0 0.0 - 0.2 %    Comment: [facility]formed at [facility] [address], [place] [id number] Glucose, capillary     Status: Abnormal   Collection Time: 07/08/22  8:18 AM  Result Value Ref Range   Glucose-Capillary 163 (H) 70 - 99 mg/dL    Comment: Glucose reference range applies only to samples taken after fasting for at least 8 hours.  CBC     Status: Abnormal   Collection Time: 07/09/22  4:08 AM  Result Value Ref Range   WBC 22.5 (H) 4.0 - 10.5 K/uL   RBC 3.07 (L) 3.87 - 5.11 MIL/uL   Hemoglobin 9.4 (L) 12.0 - 15.0 g/dL   HCT 28.0 (L) 36.0 - 46.0 %   MCV  91.2 80.0 - 100.0 fL   MCH 30.6 26.0 - 34.0 pg   MCHC 33.6 30.0 - 36.0 g/dL   RDW 14.4 11.5 - 15.5 %   Platelets 261 150 - 400 K/uL   nRBC 0.0 0.0 - 0.2 %    Comment: Performed at [facility], [address] [place], [place] [address]    [place] Limited  Result Date: 07/09/2022 CLINICAL DATA:  Premature rupture of membranes. EXAM: LIMITED OBSTETRIC ULTRASOUND COMPARISON:  None Available. FINDINGS: Number of Fetuses: 1 Heart Rate:  138 bpm Movement: Yes Presentation: Transverse Placental Location: Anterior Previa: Marginal placenta previa versus complete placenta previa, but difficult to delineate secondary to adjacent blood products. Small amount of blood is seen between the placenta and cervix. On prior ultrasound it appears to be a complete placenta previa. Amniotic Fluid (Subjective):  Within normal limits. AFI: 14.4 cm BPD: 7.7 cm 30 w  5 d MATERNAL FINDINGS: Cervix:  Appears closed. Uterus/Adnexae: No abnormality visualized.  IMPRESSION: Single live intrauterine pregnancy. Marginal placenta previa versus complete placenta previa, but difficult to delineate secondary to adjacent blood products. Small amount of blood is seen between the placenta and cervix. On prior ultrasound it appears to be a complete placenta previa. This exam is performed on an emergent basis and does not comprehensively evaluate fetal size, dating, or anatomy; follow-up complete OB US should be considered if further fetal assessment is warranted. Electronically Signed   By: Kathreen Devoid M.D.   On: 07/09/2022 08:40   US OB Limited  Result Date: 07/08/2022 CLINICAL DATA:  Assigned a previa.  Premature rupture of membranes EXAM: LIMITED OBSTETRIC ULTRASOUND COMPARISON:  07/06/2022 FINDINGS: Number of Fetuses: 1 Heart Rate:  138 bpm Movement: Yes Presentation: Transverse, head maternal right Placental Location: Anterior Previa: Complete Amniotic Fluid (Subjective):  Within normal limits. AFI: 14.8 cm BPD: 7.94 cm 31 w  6 d MATERNAL FINDINGS: Cervix:  Appears closed.  Length: 5.8 cm. Uterus/Adnexae: No abnormality visualized. IMPRESSION: 1. Single live intrauterine gestation in transverse presentation. 2. Complete placenta previa. 3. Amniotic fluid index of 14.8 cm, within normal limits. 4. Cervix is closed measuring 5.8 cm in length. This exam is performed on an urgent basis and does not comprehensively evaluate fetal size, dating, or anatomy; follow-up complete OB US should be considered if further fetal assessment is warranted. Electronically Signed   By: Davina Poke D.O.   On: 07/08/2022 10:47    Current scheduled medications  labetalol  200 mg Oral BID   pantoprazole  40 mg Oral Daily   valACYclovir  500 mg Oral BID    I have reviewed the patient's current medications.  ASSESSMENT: Patient Active Problem List   Diagnosis Date Noted   Regular astigmatism 07/08/2022   Vaginal bleeding in pregnancy, third trimester 07/06/2022   LGA (large for  gestational age) fetus affecting management of mother 05/17/2022   Placenta previa, anterior 04/19/2022   GDM diagnosed at 12 weeks 03/03/2022   LGSIL on Pap smear of cervix 02/28/2022   BMI 40.0-44.9, adult ([place]) 02/22/2022   Chronic hypertension affecting pregnancy 02/22/2022   Multigravida of advanced maternal age 38/22/2023   Medication exposure during first trimester of pregnancy 02/22/2022   Supervision of high risk pregnancy, antepartum 01/31/2022   Mixed hyperlipidemia 07/20/2021   Maternal morbid obesity, antepartum ([place]) 07/20/2021   Genital herpes 02/03/2021   Chronic lower back pain 05/30/2020   History of migraine 05/30/2020   Chronic pain of both knees 05/30/2020  Irritable bowel syndrome with diarrhea 10/22/2018   Essential hypertension 10/22/2018   Recurrent major depressive disorder, in partial remission ([place]) 10/22/2018   Gastroesophageal reflux disease 10/22/2018    PLAN: 1) High risk antepartum  -Discussed plan of care with Dr. Ouida Sills  -Continue routine antepartum care  -Daily NST -May transfer to Mother/Baby for continued antepartum care    2) Vaginal bleeding in 3rd trimester -Pregnancy complicated by anterior placenta previa -Bleeding has improved, continues to have small amount of spotting but denies large gushes  -2 golf ball size clots removed this morning during pelvic exam  -Type cross matched for 2 units pRBC -Anesthesia notified of admission - consult completed yesterday  -NICU consult completed yesterday    3) Possible pPROM -Watery discharge noted during pelvic exam yesterday along with positive ROM + -She reported small gushes of watery, blood tinged discharge has not had any gushes > 24 hours  -AFI yesterday was 18cm, this morning AFI was 14.8 cm -No pooling or watery discharge seen on speculum exam today  -Fern negative  -Discussed of possible pPROM versus false positive ROM + d/t contamination with blood.  False positive ROM + more  likely given stable AFI and no continued LOF.   -Will continue latency antibiotics at this time since risk of antibiotics is lower than risk of infection associated with possible pPROM   -Plan to repeat AFI tomorrow  -GBS PCR and GC/CT Negative  -Wet prep negative  -Neuro-protection mag sulfate infusion completed  -S/P 2 doses of betamethasone - 2nd dose given at 2330 07/07/2022   3) Chronic hypertension  -BP's normal with occasional mild range  -Continue Labetalol 200 mg BID    4) A1GDM -Advancing to soft diet  -Fasting and 2 hours PP when able to eat regular diet   Drinda Butts, CNM Certified Nurse Midwife [facility]

## 2022-07-09 NOTE — Progress Notes (Signed)
NST: Baseline FHR: 130 beats/min Variability: moderate Accelerations: present Decelerations: absent Tocometry: Quiet   Interpretation: Category I INDICATIONS: gestational diabetes mellitus, chronic hypertension, and placenta previa  RESULTS:  A NST procedure was performed with FHR monitoring and a normal baseline established, appropriate time of 20-40 minutes of evaluation, and accels >2 seen w 15x15 characteristics.  Results show a REACTIVE NST.   Drinda Butts, CNM Certified Nurse Midwife [facility]

## 2022-07-10 ENCOUNTER — Inpatient Hospital Stay (HOSPITAL_BASED_OUTPATIENT_CLINIC_OR_DEPARTMENT_OTHER): Payer: BC Managed Care – PPO

## 2022-07-10 ENCOUNTER — Other Ambulatory Visit: Payer: Self-pay

## 2022-07-10 VITALS — BP 132/77 | HR 115 | Temp 97.9°F

## 2022-07-10 DIAGNOSIS — O0990 Supervision of high risk pregnancy, unspecified, unspecified trimester: Secondary | ICD-10-CM

## 2022-07-10 DIAGNOSIS — O10919 Unspecified pre-existing hypertension complicating pregnancy, unspecified trimester: Secondary | ICD-10-CM

## 2022-07-10 DIAGNOSIS — O09523 Supervision of elderly multigravida, third trimester: Secondary | ICD-10-CM

## 2022-07-10 DIAGNOSIS — O24420 Gestational diabetes mellitus in childbirth, diet controlled: Secondary | ICD-10-CM

## 2022-07-10 DIAGNOSIS — O4420 Partial placenta previa NOS or without hemorrhage, unspecified trimester: Secondary | ICD-10-CM

## 2022-07-10 DIAGNOSIS — O99210 Obesity complicating pregnancy, unspecified trimester: Secondary | ICD-10-CM

## 2022-07-10 DIAGNOSIS — Z3A30 30 weeks gestation of pregnancy: Secondary | ICD-10-CM

## 2022-07-10 DIAGNOSIS — O24410 Gestational diabetes mellitus in pregnancy, diet controlled: Secondary | ICD-10-CM

## 2022-07-10 DIAGNOSIS — O99013 Anemia complicating pregnancy, third trimester: Secondary | ICD-10-CM

## 2022-07-10 DIAGNOSIS — O10013 Pre-existing essential hypertension complicating pregnancy, third trimester: Secondary | ICD-10-CM

## 2022-07-10 DIAGNOSIS — O99213 Obesity complicating pregnancy, third trimester: Secondary | ICD-10-CM

## 2022-07-10 DIAGNOSIS — O4413 Placenta previa with hemorrhage, third trimester: Secondary | ICD-10-CM

## 2022-07-10 DIAGNOSIS — O163 Unspecified maternal hypertension, third trimester: Secondary | ICD-10-CM

## 2022-07-10 DIAGNOSIS — O10913 Unspecified pre-existing hypertension complicating pregnancy, third trimester: Secondary | ICD-10-CM

## 2022-07-10 DIAGNOSIS — O4403 Placenta previa specified as without hemorrhage, third trimester: Secondary | ICD-10-CM

## 2022-07-10 DIAGNOSIS — E669 Obesity, unspecified: Secondary | ICD-10-CM

## 2022-07-10 LAB — CBC
HCT: 30.3 % — ABNORMAL LOW (ref 36.0–46.0)
Hemoglobin: 10.4 g/dL — ABNORMAL LOW (ref 12.0–15.0)
MCH: 31 pg (ref 26.0–34.0)
MCHC: 34.3 g/dL (ref 30.0–36.0)
MCV: 90.4 fL (ref 80.0–100.0)
Platelets: 265 10*3/uL (ref 150–400)
RBC: 3.35 MIL/uL — ABNORMAL LOW (ref 3.87–5.11)
RDW: 14.2 % (ref 11.5–15.5)
WBC: 17.9 10*3/uL — ABNORMAL HIGH (ref 4.0–10.5)
nRBC: 0.1 % (ref 0.0–0.2)

## 2022-07-10 LAB — TYPE AND SCREEN
ABO/RH(D): A POS
Antibody Screen: NEGATIVE
Unit division: 0
Unit division: 0

## 2022-07-10 LAB — BPAM RBC
Blood Product Expiration Date: 202311292359
Blood Product Expiration Date: 202311302359
Unit Type and Rh: 6200
Unit Type and Rh: 6200

## 2022-07-10 LAB — PREPARE RBC (CROSSMATCH)

## 2022-07-10 NOTE — Consult Note (Signed)
Maternal-Fetal Medicine   Name: Brandi [name] DOB: [birth year] MRN: [id number] Referring Provider: Linda Hedges, CNM  I had the pleasure of seeing Brandi [name] today at [facility], [facility].  She is G2 P1001 at 30w 6d and was admitted on 07/06/22 with vaginal bleeding.  Her pregnancy is well dated by 8-week ultrasound.  Her high risk pregnancy problems include: -Placenta previa.  Patient had vaginal bleeding 4 days ago and passed some clots.  She reports that after the initial episode her bleeding had substantially decreased and is only independent in spotting now.  She does not have uterine contractions or abdominal pain.  No history of sexual intercourse for the bleeding. Since admission, the patient received a course of antenatal corticosteroids.  -Chronic hypertension.  Patient takes labetalol with good control. -Gestational diabetes.  Well-controlled on diet. -Class III obesity.  Medications: Labetalol 200 mg twice daily, pantoprazole 40 mg daily, valacyclovir 500 mg twice daily. Allergies: Eluxadoline (Viberzi), amoxicillin (nausea, vomiting and rash) Obstetric history is significant for a term vaginal delivery 15 years ago and her son is in good health. Prenatal course: On cell-free fetal Linea screening, the risks of fetal aneuploidies are not increased.  P/E: Patient is comfortably lying down and not in distress. Vitals stable. BP 132/77 mm Hg and pulse 115/min. T 97.9. Abdomen: Soft gravid uterus; no tenderness. Ultrasound Fetal growth is appropriate for gestational age.  Amniotic fluid is normal and good fetal activity seen.  To better assess the placental position, we performed a transvaginal ultrasound.  The cervix is long and closed.  What appears to be the placental edge is slightly more than 3 cm from the internal os.  However, had a heterogeneous mass (4 x 2 cm) inferior to the placental edge covering the internal os is seen and it is difficult to differentiate from the  placenta.  This could represent part of the placenta or a blood clot.  Patient was asked to cough and it did not displace the heterogenous mass.  Labs: WBC 17.9, hemoglobin 10.4, hematocrit 30.3, platelets 265.  Urine toxicology cannabinoid positive.  Blood type A positive. Our concerns include Placenta previa I have not changed the diagnosis of placenta previa based on our ultrasound.  However, I counseled the patient on the possibility of a small abruption (blood clot) that can complicate hypertension.  Patient has minimal bleeding now and he is not in labor.  I explained that placenta previa causes recurrent hemorrhage and if she experiences recurrent episode, she may be hospitalized till delivery.  Patient has anemia, and it is desirable to keep the hematocrit close to 40 at delivery.  Iron supplements may be required.  Recommendations -Patient has an appointment for BPP on 07/19/2022. -Weekly BPP from [redacted] weeks gestation till delivery. -Follow-up transvaginal ultrasound in 3 weeks to evaluate the placenta. -Mode of delivery should be by cesarean section if patient returns with complaints of vaginal bleeding. -Patient may be discharged today with instructions.  Thank you for consultation.  If you have any questions or concerns, please contact me the [facility].  Consultation including face-to-face counseling (more than 50% of time spent) is 45 minutes.

## 2022-07-10 NOTE — Discharge Summary (Signed)
Patient ID: Brandi [name] MRN: [id number] DOB/AGE: Mar 02, 1986 36 y.o.  Admit date: 07/06/2022 Discharge date: 07/10/2022  Admission Diagnoses: Vaginal bleeding in pregnancy, third trimester [O46.93]  Discharge Diagnoses: Same, placenta previa   Prenatal Care Site:  [facility]   Prenatal Procedures: NST and ultrasound  Consults: Neonatology, Maternal Fetal Medicine, Anesthesia   Significant Diagnostic Studies:  Results for orders placed or performed during the hospital encounter of 07/06/22 (from the past 168 hour(s))  Wet prep, genital   Collection Time: 07/06/22 11:11 PM   Specimen: Vaginal/Rectal  Result Value Ref Range   Yeast Wet Prep HPF POC NONE SEEN NONE SEEN   Trich, Wet Prep NONE SEEN NONE SEEN   Clue Cells Wet Prep HPF POC NONE SEEN NONE SEEN   WBC, Wet Prep HPF POC <10 (A) <10   Sperm NONE SEEN   ROM Plus ([facility] only)   Collection Time: 07/06/22 11:11 PM  Result Value Ref Range   Rom Plus POSITIVE   Urine Drug Screen, Qualitative ([facility] only)   Collection Time: 07/06/22 11:41 PM  Result Value Ref Range   Tricyclic, Ur Screen NONE DETECTED NONE DETECTED   Amphetamines, Ur Screen NONE DETECTED NONE DETECTED   MDMA (Ecstasy)Ur Screen NONE DETECTED NONE DETECTED   Cocaine Metabolite,Ur [place] NONE DETECTED NONE DETECTED   Opiate, Ur Screen NONE DETECTED NONE DETECTED   Phencyclidine (PCP) Ur S NONE DETECTED NONE DETECTED   Cannabinoid 50 Ng, Ur [place] POSITIVE (A) NONE DETECTED   Barbiturates, Ur Screen NONE DETECTED NONE DETECTED   Benzodiazepine, Ur Scrn NONE DETECTED NONE DETECTED   Methadone Scn, Ur NONE DETECTED NONE DETECTED  CBC with Differential/Platelet   Collection Time: 07/06/22 11:41 PM  Result Value Ref Range   WBC 19.3 (H) 4.0 - 10.5 K/uL   RBC 3.58 (L) 3.87 - 5.11 MIL/uL   Hemoglobin 10.9 (L) 12.0 - 15.0 g/dL   HCT 32.1 (L) 36.0 - 46.0 %   MCV 89.7 80.0 - 100.0 fL   MCH 30.4 26.0 - 34.0 pg   MCHC 34.0 30.0 - 36.0 g/dL   RDW 13.8  11.5 - 15.5 %   Platelets 275 150 - 400 K/uL   nRBC 0.0 0.0 - 0.2 %   Neutrophils Relative % 77 %   Neutro Abs 15.1 (H) 1.7 - 7.7 K/uL   Lymphocytes Relative 15 %   Lymphs Abs 2.8 0.7 - 4.0 K/uL   Monocytes Relative 5 %   Monocytes Absolute 1.0 0.1 - 1.0 K/uL   Eosinophils Relative 2 %   Eosinophils Absolute 0.4 0.0 - 0.5 K/uL   Basophils Relative 0 %   Basophils Absolute 0.0 0.0 - 0.1 K/uL   Immature Granulocytes 1 %   Abs Immature Granulocytes 0.13 (H) 0.00 - 0.07 K/uL  Urinalysis, Complete w Microscopic   Collection Time: 07/06/22 11:41 PM  Result Value Ref Range   Color, Urine RED (A) YELLOW   APPearance HAZY (A) CLEAR   Specific Gravity, Urine 1.009 1.005 - 1.030   pH  5.0 - 8.0    TEST NOT REPORTED DUE TO COLOR INTERFERENCE OF URINE PIGMENT   Glucose, UA (A) NEGATIVE mg/dL    TEST NOT REPORTED DUE TO COLOR INTERFERENCE OF URINE PIGMENT   Hgb urine dipstick (A) NEGATIVE    TEST NOT REPORTED DUE TO COLOR INTERFERENCE OF URINE PIGMENT   Bilirubin Urine (A) NEGATIVE    TEST NOT REPORTED DUE TO COLOR INTERFERENCE OF URINE PIGMENT   Ketones,  ur (A) NEGATIVE mg/dL    TEST NOT REPORTED DUE TO COLOR INTERFERENCE OF URINE PIGMENT   Protein, ur (A) NEGATIVE mg/dL    TEST NOT REPORTED DUE TO COLOR INTERFERENCE OF URINE PIGMENT   Nitrite (A) NEGATIVE    TEST NOT REPORTED DUE TO COLOR INTERFERENCE OF URINE PIGMENT   Leukocytes,Ua (A) NEGATIVE    TEST NOT REPORTED DUE TO COLOR INTERFERENCE OF URINE PIGMENT   RBC / HPF >50 (H) 0 - 5 RBC/hpf   WBC, UA 0-5 0 - 5 WBC/hpf   Bacteria, UA RARE (A) NONE SEEN   Squamous Epithelial / LPF 11-20 0 - 5  Type and screen [employer]   Collection Time: 07/06/22 11:41 PM  Result Value Ref Range   ABO/RH(D) A POS    Antibody Screen NEG    Sample Expiration 07/09/2022,2359    Unit Number [id number]    Blood Component Type RED CELLS,LR    Unit division 00    Status of Unit REL FROM [facility]    Transfusion Status OK TO  TRANSFUSE    Crossmatch Result      Compatible Performed at [facility], [address], [place], [place] [address]    Unit Number [id number]    Blood Component Type RED CELLS,LR    Unit division 00    Status of Unit REL FROM [facility]    Transfusion Status OK TO TRANSFUSE    Crossmatch Result Compatible   BPAM RBC   Collection Time: 07/06/22 11:41 PM  Result Value Ref Range   Blood Product Unit Number [id number]    PRODUCT CODE G1829H37    Unit Type and Rh 6200    Blood Product Expiration Date 169678938101    Blood Product Unit Number [id number]    PRODUCT CODE U2353I14    Unit Type and Rh 6200    Blood Product Expiration Date 431540086761   Prepare RBC (crossmatch)   Collection Time: 07/07/22  9:52 AM  Result Value Ref Range   Order Confirmation      ORDER PROCESSED BY BLOOD BANK Performed at [facility], [address] [place], [place] [address]   CBC   Collection Time: 07/07/22 10:12 AM  Result Value Ref Range   WBC 24.4 (H) 4.0 - 10.5 K/uL   RBC 3.69 (L) 3.87 - 5.11 MIL/uL   Hemoglobin 11.2 (L) 12.0 - 15.0 g/dL   HCT 33.2 (L) 36.0 - 46.0 %   MCV 90.0 80.0 - 100.0 fL   MCH 30.4 26.0 - 34.0 pg   MCHC 33.7 30.0 - 36.0 g/dL   RDW 13.8 11.5 - 15.5 %   Platelets 295 150 - 400 K/uL   nRBC 0.0 0.0 - 0.2 %  ABO/Rh   Collection Time: 07/07/22 10:12 AM  Result Value Ref Range   ABO/RH(D)      A POS Performed at [facility], [address] [place], [place] [address]   Group B strep by PCR   Collection Time: 07/07/22 10:29 AM   Specimen: Vaginal/Rectal; Genital  Result Value Ref Range   Group B strep by PCR NEGATIVE NEGATIVE  Chlamydia/NGC rt PCR ([facility] only)   Collection Time: 07/07/22 11:15 AM  Result Value Ref Range   Specimen source GC/Chlam URINE, RANDOM    Chlamydia Tr NOT DETECTED NOT DETECTED   N gonorrhoeae NOT DETECTED NOT DETECTED  Glucose, capillary   Collection Time: 07/07/22  3:09 PM  Result Value Ref Range  Glucose-Capillary 114 (H) 70 - 99 mg/dL  CBC   Collection Time: 07/08/22  6:10 AM  Result Value Ref Range   WBC 23.5 (H) 4.0 - 10.5 K/uL   RBC 3.35 (L) 3.87 - 5.11 MIL/uL   Hemoglobin 10.3 (L) 12.0 - 15.0 g/dL   HCT 30.4 (L) 36.0 - 46.0 %   MCV 90.7 80.0 - 100.0 fL   MCH 30.7 26.0 - 34.0 pg   MCHC 33.9 30.0 - 36.0 g/dL   RDW 14.2 11.5 - 15.5 %   Platelets 276 150 - 400 K/uL   nRBC 0.0 0.0 - 0.2 %  Glucose, capillary   Collection Time: 07/08/22  8:18 AM  Result Value Ref Range   Glucose-Capillary 163 (H) 70 - 99 mg/dL  CBC   Collection Time: 07/09/22  4:08 AM  Result Value Ref Range   WBC 22.5 (H) 4.0 - 10.5 K/uL   RBC 3.07 (L) 3.87 - 5.11 MIL/uL   Hemoglobin 9.4 (L) 12.0 - 15.0 g/dL   HCT 28.0 (L) 36.0 - 46.0 %   MCV 91.2 80.0 - 100.0 fL   MCH 30.6 26.0 - 34.0 pg   MCHC 33.6 30.0 - 36.0 g/dL   RDW 14.4 11.5 - 15.5 %   Platelets 261 150 - 400 K/uL   nRBC 0.0 0.0 - 0.2 %  CBC   Collection Time: 07/10/22  6:31 AM  Result Value Ref Range   WBC 17.9 (H) 4.0 - 10.5 K/uL   RBC 3.35 (L) 3.87 - 5.11 MIL/uL   Hemoglobin 10.4 (L) 12.0 - 15.0 g/dL   HCT 30.3 (L) 36.0 - 46.0 %   MCV 90.4 80.0 - 100.0 fL   MCH 31.0 26.0 - 34.0 pg   MCHC 34.3 30.0 - 36.0 g/dL   RDW 14.2 11.5 - 15.5 %   Platelets 265 150 - 400 K/uL   nRBC 0.1 0.0 - 0.2 %    Treatments: IV hydration, antibiotics: metronidazole and Ancef, and steroids: Betamethasone x 2  Hospital Course:  This is a 36 y.o. G2P1001 with IUP at 78w6dadmitted for vaginal bleeding in 3rd trimester.  She has a known placenta previa in pregnancy.  She initially presented after having some clots at home and now reports only light, occasional spotting.  She was initially started on magnesium sulfate for neuroprotection and also received betamethasone x 2 doses.  Her initial pelvic exam was concerning for possible pPROM.  Rom + was positive, Fern negative, AFI stable over admission at 18->14.8->14.4->20.69cm.  She had no continued LOF during  her admission.  Latency antibiotics were discontinued.  She was seen by Neonatology, Maternal Fetal Medicine, and Anesthesia during her stay.  She was observed, fetal heart rate monitoring remained reassuring.  Strict bleeding precautions were discussed.  She was instructed to continue pelvic rest. She was deemed stable for discharge to home with outpatient follow up with her primary prenatal provider.   Discharge Physical Exam:  BP 114/73 (BP Location: Left Arm)   Pulse 78   Temp 98.3 F (36.8 C) (Oral)   Resp 18   Ht _0  (1.651 m)   Wt 111.1 kg   LMP 11/30/2021   SpO2 100%   BMI 40.77 kg/m   General: NAD CV: RRR Pulm: CTABL, nl effort ABD: s/nd/nt, gravid DVT Evaluation: LE non-ttp, no evidence of DVT on exam. Pelvic: deferred   NST: Please see note from AM rounds    Discharge Condition: Stable  Disposition: Discharge disposition: 01-Home or  Self Care        Allergies as of 07/10/2022       Reactions   Viberzi [eluxadoline] Itching   Amoxicillin Nausea And Vomiting, Rash        Medication List     TAKE these medications    Accu-Chek Guide test strip Generic drug: glucose blood Use to check blood sugars four times a day was instructed   Accu-Chek Guide w/Device Kit 1 Device by Does not apply route 4 (four) times daily.   Accu-Chek Softclix Lancets lancets 1 each by Other route 4 (four) times daily.   Albuterol Sulfate 108 (90 Base) MCG/ACT Aepb Commonly known as: PROAIR RESPICLICK Inhale into the lungs.   aspirin EC 81 MG tablet Take 1 tablet (81 mg total) by mouth daily.   cyclobenzaprine 10 MG tablet Commonly known as: FLEXERIL Take 1 tablet (10 mg total) by mouth 2 (two) times daily as needed for muscle spasms.   fluticasone 50 MCG/ACT nasal spray Commonly known as: FLONASE Place 1 spray into both nostrils 2 (two) times daily.   hydrocortisone 2.5 % rectal cream Commonly known as: Anusol-HC Place 1 application rectally 2 (two) times  daily as needed for hemorrhoids or anal itching.   labetalol 200 MG tablet Commonly known as: NORMODYNE Take 1 tablet (200 mg total) by mouth 2 (two) times daily.   loratadine 10 MG tablet Commonly known as: CLARITIN Take 10 mg by mouth daily as needed for allergies.   Magnesium Oxide 400 240 MG Pack Generic drug: Magnesium Oxide Take 1 tablet by mouth daily as needed (headache).   omeprazole 20 MG capsule Commonly known as: PRILOSEC Take 1 capsule (20 mg total) by mouth daily.   valACYclovir 500 MG tablet Commonly known as: VALTREX 1 TABLET TWICE A DAY X 3 DAYS AS NEEDED FOR OUTBREAKS   WOMENS DAILY FORMULA PO Take by mouth.        Follow-up [place]             . Go on 07/12/2022.   Why: scheduled prenatal visit Contact information: [place]                Signed:  Minda Meo 07/10/2022 12:56 PM ----- Drinda Butts, CNM Certified Nurse Midwife [facility]

## 2022-07-10 NOTE — Progress Notes (Signed)
Patient discharged home with family.  Discharge instructions, when to follow up, and prescriptions reviewed with patient.  Patient verbalized understanding. Patient will be escorted out by auxiliary.   

## 2022-07-10 NOTE — Progress Notes (Signed)
ANTEPARTUM PROGRESS NOTE  Brandi [name] is a 36 y.o. G2P1001 at 79w4dwho is admitted for vaginal bleeding in 3rd trimester and possible pPROM.   Estimated Date of Delivery: 09/12/22  Length of Stay:  3 Days. Admitted 07/06/2022  Subjective: Denies cramping or contractions, denies current spotting or bleeding   She reports:  -active fetal movement -no leakage of fluid -no significant vaginal bleeding -no contractions  Vitals:  BP 114/73 (BP Location: Left Arm)   Pulse 78   Temp 98.3 F (36.8 C) (Oral)   Resp 18   Ht '5\' 5"'$  (1.651 m)   Wt 111.1 kg   LMP 11/30/2021   SpO2 100%   BMI 40.77 kg/m  Physical Examination: General:   alert, cooperative, and no distress  Skin:  normal  Neurologic:    Alert & oriented x 3  Lungs:    Normal respiratory effort  Abdomen:   Soft, gravid, non-tender  Pelvis:  Deferred   FHT:  135 BPM  Extremities: : non-tender, symmetric, No edema bilaterally.  DTRs: 2+/2+    NST: Baseline FHR: 140 beats/min Variability: moderate Accelerations: present Decelerations: absent Tocometry: Quiet Time: 20 minutes   Interpretation: Category 1 INDICATIONS: Previa, CHTN, GDM  RESULTS:  A NST procedure was performed with FHR monitoring and a normal baseline established, appropriate time of 20-40 minutes of evaluation, and accels >2 seen w 15x15 characteristics.  Results show a REACTIVE NST.    Results for orders placed or performed during the hospital encounter of 07/06/22 (from the past 48 hour(s))  CBC     Status: Abnormal   Collection Time: 07/09/22  4:08 AM  Result Value Ref Range   WBC 22.5 (H) 4.0 - 10.5 K/uL   RBC 3.07 (L) 3.87 - 5.11 MIL/uL   Hemoglobin 9.4 (L) 12.0 - 15.0 g/dL   HCT 28.0 (L) 36.0 - 46.0 %   MCV 91.2 80.0 - 100.0 fL   MCH 30.6 26.0 - 34.0 pg   MCHC 33.6 30.0 - 36.0 g/dL   RDW 14.4 11.5 - 15.5 %   Platelets 261 150 - 400 K/uL   nRBC 0.0 0.0 - 0.2 %    Comment: [facility]formed at [facility] [address],  [place] [id number] CBC     Status: Abnormal   Collection Time: 07/10/22  6:31 AM  Result Value Ref Range   WBC 17.9 (H) 4.0 - 10.5 K/uL   RBC 3.35 (L) 3.87 - 5.11 MIL/uL   Hemoglobin 10.4 (L) 12.0 - 15.0 g/dL   HCT 30.3 (L) 36.0 - 46.0 %   MCV 90.4 80.0 - 100.0 fL   MCH 31.0 26.0 - 34.0 pg   MCHC 34.3 30.0 - 36.0 g/dL   RDW 14.2 11.5 - 15.5 %   Platelets 265 150 - 400 K/uL   nRBC 0.1 0.0 - 0.2 %    Comment: Performed at [facility] [address] [place] [address]   [email or web address]  Result Date: 07/09/2022 CLINICAL DATA:  Premature rupture of membranes. EXAM: LIMITED OBSTETRIC ULTRASOUND COMPARISON:  None Available. FINDINGS: Number of Fetuses: 1 Heart Rate:  138 bpm Movement: Yes Presentation: Transverse Placental Location: Anterior Previa: Marginal placenta previa versus complete placenta previa, but difficult to delineate secondary to adjacent blood products. Small amount of blood is seen between the placenta and cervix. On prior ultrasound it appears to be a complete placenta previa. Amniotic Fluid (Subjective):  Within normal limits. AFI: 14.4 cm  BPD: 7.7 cm 30 w  5 d MATERNAL FINDINGS: Cervix:  Appears closed. Uterus/Adnexae: No abnormality visualized. IMPRESSION: Single live intrauterine pregnancy. Marginal placenta previa versus complete placenta previa, but difficult to delineate secondary to adjacent blood products. Small amount of blood is seen between the placenta and cervix. On prior ultrasound it appears to be a complete placenta previa. This exam is performed on an emergent basis and does not comprehensively evaluate fetal size, dating, or anatomy; follow-up complete OB US should be considered if further fetal assessment is warranted. Electronically Signed   By: Brandi [name] M.D.   On: 07/09/2022 08:40    Current scheduled medications  labetalol  200 mg Oral BID   pantoprazole  40 mg Oral Daily   valACYclovir  500 mg Oral BID    I have reviewed the  patient's current medications.  ASSESSMENT: Patient Active Problem List   Diagnosis Date Noted   Regular astigmatism 07/08/2022   Vaginal bleeding in pregnancy, third trimester 07/06/2022   LGA (large for gestational age) fetus affecting management of mother 05/17/2022   Placenta previa, anterior 04/19/2022   GDM diagnosed at 12 weeks 03/03/2022   LGSIL on Pap smear of cervix 02/28/2022   BMI 40.0-44.9, adult ([place]) 02/22/2022   Chronic hypertension affecting pregnancy 02/22/2022   Multigravida of advanced maternal age 92/22/2023   Medication exposure during first trimester of pregnancy 02/22/2022   Supervision of high risk pregnancy, antepartum 01/31/2022   Mixed hyperlipidemia 07/20/2021   Maternal morbid obesity, antepartum ([place]) 07/20/2021   Genital herpes 02/03/2021   Chronic lower back pain 05/30/2020   History of migraine 05/30/2020   Chronic pain of both knees 05/30/2020   Irritable bowel syndrome with diarrhea 10/22/2018   Essential hypertension 10/22/2018   Recurrent major depressive disorder, in partial remission ([place]) 10/22/2018   Gastroesophageal reflux disease 10/22/2018    PLAN: 1) High risk antepartum  -Discussed plan of care with Dr. Glennon [name]  -Continue routine antepartum care  -Daily NST -MFM consult and ultrasound today  -We discussed that if her bleeding is stable and no signs of pPROM then she can possibly discharge home today.  Brandi [name] will see MFM today and will discuss plan of care and disposition.    2) Vaginal bleeding in 3rd trimester -Pregnancy complicated by anterior placenta previa -Bleeding has improved - no bleeding > 24 hours  -Type cross matched for 2 units pRBC -Anesthesia notified of admission - consult completed  -NICU consult completed    3) Possible pPROM -Watery discharge noted during pelvic exam yesterday along with positive ROM + -She reported small gushes of watery, blood tinged discharge has not had any gushes > 24 hours  -AFI  yesterday was 18cm -14.8-14.4cm -No pooling or watery discharge seen on speculum exam yesterday  -Fern negative  -Discussed of possible pPROM versus false positive ROM + d/t contamination with blood.  False positive ROM + more likely given stable AFI and no continued LOF.   -Will continue latency antibiotics at this time since risk of antibiotics is lower than risk of infection associated with possible pPROM   -Plan to repeat US with MFM  -GBS PCR and GC/CT Negative  -Wet prep negative  -Neuro-protection mag sulfate infusion completed  -S/P 2 doses of betamethasone - 2nd dose given at 2330 07/07/2022   3) Chronic hypertension  -BP's normal with occasional mild range  -Continue Labetalol 200 mg BID    4) A1GDM -Soft diet  -Fasting and 2 hours PP when able  to eat regular diet   Drinda Butts, CNM Certified Nurse Midwife [facility]

## 2022-07-10 NOTE — Progress Notes (Signed)
[place] May 05, 1986 [redacted]w[redacted]d NST  Indication:  low lying placenta, vaginal bleeding  Started @ 0840 Completed 0900  FHR: 135 beats/min Variability: moderate Accelerations: 15x15 Declerations: absent Tocometry: none

## 2022-07-12 ENCOUNTER — Inpatient Hospital Stay (HOSPITAL_COMMUNITY)
Admission: AD | Admit: 2022-07-12 | Discharge: 2022-07-12 | Disposition: A | Discharge status: Other Acute Inpatient Hospital | Payer: BC Managed Care – PPO | Attending: Obstetrics and Gynecology | Admitting: Obstetrics and Gynecology

## 2022-07-12 ENCOUNTER — Encounter (HOSPITAL_COMMUNITY): Payer: Self-pay | Admitting: Obstetrics and Gynecology

## 2022-07-12 ENCOUNTER — Encounter: Payer: Self-pay | Admitting: Obstetrics and Gynecology

## 2022-07-12 ENCOUNTER — Ambulatory Visit (INDEPENDENT_AMBULATORY_CARE_PROVIDER_SITE_OTHER): Payer: BC Managed Care – PPO | Admitting: Obstetrics and Gynecology

## 2022-07-12 VITALS — BP 130/84 | HR 96 | Wt 243.0 lb

## 2022-07-12 DIAGNOSIS — O9902 Anemia complicating childbirth: Secondary | ICD-10-CM | POA: Diagnosis not present

## 2022-07-12 DIAGNOSIS — F329 Major depressive disorder, single episode, unspecified: Secondary | ICD-10-CM | POA: Diagnosis not present

## 2022-07-12 DIAGNOSIS — O24420 Gestational diabetes mellitus in childbirth, diet controlled: Secondary | ICD-10-CM | POA: Diagnosis not present

## 2022-07-12 DIAGNOSIS — O10913 Unspecified pre-existing hypertension complicating pregnancy, third trimester: Secondary | ICD-10-CM

## 2022-07-12 DIAGNOSIS — O99343 Other mental disorders complicating pregnancy, third trimester: Secondary | ICD-10-CM | POA: Diagnosis not present

## 2022-07-12 DIAGNOSIS — D649 Anemia, unspecified: Secondary | ICD-10-CM

## 2022-07-12 DIAGNOSIS — O4403 Placenta previa specified as without hemorrhage, third trimester: Secondary | ICD-10-CM

## 2022-07-12 DIAGNOSIS — Z3A31 31 weeks gestation of pregnancy: Secondary | ICD-10-CM | POA: Insufficient documentation

## 2022-07-12 DIAGNOSIS — J45909 Unspecified asthma, uncomplicated: Secondary | ICD-10-CM | POA: Diagnosis not present

## 2022-07-12 DIAGNOSIS — O4411 Placenta previa with hemorrhage, first trimester: Secondary | ICD-10-CM

## 2022-07-12 DIAGNOSIS — O1002 Pre-existing essential hypertension complicating childbirth: Secondary | ICD-10-CM | POA: Diagnosis not present

## 2022-07-12 DIAGNOSIS — Z6841 Body Mass Index (BMI) 40.0 and over, adult: Secondary | ICD-10-CM

## 2022-07-12 DIAGNOSIS — O9832 Other infections with a predominantly sexual mode of transmission complicating childbirth: Secondary | ICD-10-CM | POA: Diagnosis not present

## 2022-07-12 DIAGNOSIS — O10919 Unspecified pre-existing hypertension complicating pregnancy, unspecified trimester: Secondary | ICD-10-CM

## 2022-07-12 DIAGNOSIS — N939 Abnormal uterine and vaginal bleeding, unspecified: Secondary | ICD-10-CM | POA: Diagnosis not present

## 2022-07-12 DIAGNOSIS — O4693 Antepartum hemorrhage, unspecified, third trimester: Secondary | ICD-10-CM

## 2022-07-12 DIAGNOSIS — O24410 Gestational diabetes mellitus in pregnancy, diet controlled: Secondary | ICD-10-CM | POA: Diagnosis not present

## 2022-07-12 DIAGNOSIS — O321XX0 Maternal care for breech presentation, not applicable or unspecified: Secondary | ICD-10-CM | POA: Diagnosis not present

## 2022-07-12 DIAGNOSIS — O99013 Anemia complicating pregnancy, third trimester: Secondary | ICD-10-CM

## 2022-07-12 DIAGNOSIS — A6009 Herpesviral infection of other urogenital tract: Secondary | ICD-10-CM | POA: Diagnosis not present

## 2022-07-12 DIAGNOSIS — O9952 Diseases of the respiratory system complicating childbirth: Secondary | ICD-10-CM | POA: Diagnosis not present

## 2022-07-12 DIAGNOSIS — Z880 Allergy status to penicillin: Secondary | ICD-10-CM | POA: Diagnosis not present

## 2022-07-12 DIAGNOSIS — O4413 Placenta previa with hemorrhage, third trimester: Secondary | ICD-10-CM | POA: Insufficient documentation

## 2022-07-12 DIAGNOSIS — N870 Mild cervical dysplasia: Secondary | ICD-10-CM | POA: Diagnosis not present

## 2022-07-12 HISTORY — DX: Tubulo-interstitial nephritis, not specified as acute or chronic: N12

## 2022-07-12 LAB — FIBRINOGEN: Fibrinogen: 554 mg/dL — ABNORMAL HIGH (ref 210–475)

## 2022-07-12 LAB — PROTIME-INR
INR: 1 (ref 0.8–1.2)
Prothrombin Time: 13.1 seconds (ref 11.4–15.2)

## 2022-07-12 LAB — CBC
HCT: 35.6 % — ABNORMAL LOW (ref 36.0–46.0)
Hemoglobin: 11.8 g/dL — ABNORMAL LOW (ref 12.0–15.0)
MCH: 31.4 pg (ref 26.0–34.0)
MCHC: 33.1 g/dL (ref 30.0–36.0)
MCV: 94.7 fL (ref 80.0–100.0)
Platelets: 318 10*3/uL (ref 150–400)
RBC: 3.76 MIL/uL — ABNORMAL LOW (ref 3.87–5.11)
RDW: 14.4 % (ref 11.5–15.5)
WBC: 23 10*3/uL — ABNORMAL HIGH (ref 4.0–10.5)
nRBC: 0 % (ref 0.0–0.2)

## 2022-07-12 LAB — TYPE AND SCREEN
ABO/RH(D): A POS
Antibody Screen: NEGATIVE

## 2022-07-12 LAB — APTT: aPTT: 30 seconds (ref 24–36)

## 2022-07-12 MED ORDER — LACTATED RINGERS IV SOLN: INTRAVENOUS | Status: DC

## 2022-07-12 NOTE — MAU Provider Note (Addendum)
History     CSN: [id number]  Arrival date and time: 07/12/22 1008   None     Chief Complaint  Patient presents with   Vaginal Bleeding   Brandi [name] is a 36 yo G2P1001 at 11w1dw/pregnancy complicated by HSV, obesity, AMA, anemia, cHTN, and anterio placenta previa who presents with vaginal bleeding.   [facility], she received betamethasone, was monitored and discharged.  Two days ago she had no bleeding. Did have some spotting yesterday. Had a clot come out this AM. Blood soaked through towel and sheets. Clot was gumball sized, dark red. Most bleeding has been dark red. When she went to urinate there was blood, but not seeing a lot on her pad right now. [facility] told her that there was blood pooling in the vagina. She was instructed to present here. Baby is moving. No LOF. No cramping or contractions that she can feel since Saturday.   No dizziness, lightheadedness, No CP, SOB, N/V/D. Nml bowel movements without blood. No HA, swelling, or vision changes Denies other complaints.     OB History     Gravida  2   Para  1   Term  1   Preterm      AB      Living  1      SAB      IAB      Ectopic      Multiple      Live Births  1           Past Medical History:  Diagnosis Date   Abnormal Pap smear of cervix 12/2015   ascus/pos- achd   Allergies    Arthritis    Asthma    Chlamydial cervicitis    Depression    pt has past treatment w/ Zoloft - good symptom control, but worsening IBS symptoms   Diabetes mellitus without complication (HCC)    Genital HSV    GERD (gastroesophageal reflux disease)    Heart murmur    Hypertension    IBS (irritable bowel syndrome)    Prediabetes 01/19/2022   _0  early GTT   Pyelonephritis    Thrombocytosis 07/20/2021   Trichomoniasis     Past Surgical History:  Procedure Laterality Date    COLONOSCOPY WITH PROPOFOL N/A 12/29/2020   Procedure: COLONOSCOPY WITH BIOPSY;  Surgeon: VLin Landsman MD;  [place]: [place];  Laterality: N/A;   COLPOSCOPY  04/17/2022   ESOPHAGOGASTRODUODENOSCOPY (EGD) WITH PROPOFOL N/A 12/29/2020   Procedure: ESOPHAGOGASTRODUODENOSCOPY (EGD) WITH BIOPSY;  Surgeon: VLin Landsman MD;  [place]  Service: [place]oscopy;  Laterality: N/A;   POLYPECTOMY N/A 12/29/2020   Procedure: POLYPECTOMY;  Surgeon: VLin Landsman MD;  [place]  Service: Endoscopy;  Laterality: N/A;   WISDOM TOOTH EXTRACTION      Family History  Problem Relation Age of Onset   Diabetes Mother    Hypertension Mother    Asthma Father    Heart disease Father    Heart attack Father    Asthma Sister    Hypertension Sister    ADD / ADHD Son    Cancer Maternal Aunt    Cancer Maternal Uncle    Cancer Maternal Grandmother    Lung cancer Maternal Grandmother    Heart disease Maternal Grandfather    Lung cancer  Paternal Grandmother    Heart disease Paternal Grandfather    Birth defects Neg Hx    Stroke Neg Hx     Social History   Tobacco Use   Smoking status: Former    Packs/day: 1.00    Years: 12.00    Total pack years: 12.00    Types: Cigarettes    Quit date: 12/07/2021    Years since quitting: 0.5   Smokeless tobacco: Never  Vaping Use   Vaping Use: Former   Substances: Nicotine, Flavoring  Substance Use Topics   Alcohol use: Not Currently    Alcohol/week: 1.0 standard drink of alcohol    Types: 1 Glasses of wine per week    Comment: rare   Drug use: Not Currently    Frequency: 7.0 times per week    Types: Marijuana    Comment: last use April 2023    Allergies:  Allergies  Allergen Reactions   Viberzi [Eluxadoline] Itching   Amoxicillin Nausea And Vomiting and Rash    Medications Prior to Admission  Medication Sig Dispense Refill Last Dose   aspirin EC 81 MG tablet Take 1  tablet (81 mg total) by mouth daily. 60 tablet 1 07/12/2022   fluticasone (FLONASE) 50 MCG/ACT nasal spray Place 1 spray into both nostrils 2 (two) times daily. 16 g 0 Past Month   labetalol (NORMODYNE) 200 MG tablet Take 1 tablet (200 mg total) by mouth 2 (two) times daily. 60 tablet 3 07/12/2022   Multiple Vitamins-Minerals (WOMENS DAILY FORMULA PO) Take by mouth.   07/12/2022   omeprazole (PRILOSEC) 20 MG capsule Take 1 capsule (20 mg total) by mouth daily. 90 capsule 1 07/11/2022   Accu-Chek Softclix Lancets lancets 1 each by Other route 4 (four) times daily. 100 each 12    Albuterol Sulfate 108 (90 Base) MCG/ACT AEPB Inhale into the lungs.      Blood Glucose Monitoring Suppl (ACCU-CHEK GUIDE) w/Device KIT 1 Device by Does not apply route 4 (four) times daily. 1 kit 0    cyclobenzaprine (FLEXERIL) 10 MG tablet Take 1 tablet (10 mg total) by mouth 2 (two) times daily as needed for muscle spasms. 20 tablet 0 More than a month   glucose blood (ACCU-CHEK GUIDE) test strip Use to check blood sugars four times a day was instructed 50 each 12    hydrocortisone (ANUSOL-HC) 2.5 % rectal cream Place 1 application rectally 2 (two) times daily as needed for hemorrhoids or anal itching. 30 g 0 More than a month   loratadine (CLARITIN) 10 MG tablet Take 10 mg by mouth daily as needed for allergies.   More than a month   Magnesium Oxide (MAGNESIUM OXIDE 400) 240 MG PACK Take 1 tablet by mouth daily as needed (headache). 30 each 1 More than a month   valACYclovir (VALTREX) 500 MG tablet 1 TABLET TWICE A DAY X 3 DAYS AS NEEDED FOR OUTBREAKS 30 tablet 0 07/10/2022    Review of Systems  All other systems reviewed and are negative.  Physical Exam   Blood pressure (!) 117/55, pulse 81, temperature 98.3 F (36.8 C), temperature source Oral, resp. rate 18, last menstrual period 11/30/2021, SpO2 97 %.  Physical Exam Vitals and nursing note reviewed.  Constitutional:      General: She is not in acute distress.     Appearance: Normal appearance. She is obese. She is not ill-appearing, toxic-appearing or diaphoretic.  HENT:     Head: Normocephalic and atraumatic.  Right Ear: External ear normal.     Left Ear: External ear normal.     Nose: Nose normal. No congestion or rhinorrhea.  Eyes:     General: No scleral icterus.       Right eye: No discharge.        Left eye: No discharge.     Extraocular Movements: Extraocular movements intact.     Pupils: Pupils are equal, round, and reactive to light.  Cardiovascular:     Rate and Rhythm: Normal rate and regular rhythm.     Heart sounds: Normal heart sounds.  Pulmonary:     Effort: Pulmonary effort is normal.     Breath sounds: Normal breath sounds.  Abdominal:     General: Bowel sounds are normal.     Palpations: Abdomen is soft.     Tenderness: There is no abdominal tenderness. There is no guarding or rebound.     Comments: gravid  Musculoskeletal:     Cervical back: Neck supple.     Right lower leg: No edema.     Left lower leg: No edema.  Skin:    General: Skin is warm and dry.  Neurological:     General: No focal deficit present.     Mental Status: She is alert and oriented to person, place, and time.  Psychiatric:        Mood and Affect: Mood normal.        Behavior: Behavior normal.     MAU Course  Procedures  MDM Pt w/known previa sent from clinic after speculum exam in office concerning for bleeding placenta previa. Reactive NST here. As our NICU is closed, and patient needs continued monitoring w/bleeding previa, will have to send to other facility to be admitted for observation, either until delivery or resolution of bleeding. Pt was [facility] at [facility].   Ordered IVMF w/LR, type and screen, CBC, coags. Labs all normal.    Assessment and Plan  Placenta previa - Bleeding confirmed by speculum exam this AM in office, exam not repeated in MAU - Reactive NST - Received BMZ and Mag on 11/3 and 11/4 - Pt is Rh+ - Will transfer  to [place], facility w/open NICU  Anemia - S/p Venofer 11/6 - Hb 11.8 today (was 10.4)  Pregnancy [redacted]w[redacted]d- Requires facility w/open NICU - Per above   [facility]    [facility]11/05/2022, 2:07 PM

## 2022-07-12 NOTE — Progress Notes (Signed)
PRENATAL VISIT NOTE  Subjective:  Brandi [name] is a 36 y.o. G2P1001 at 88w1dbeing seen today for ongoing prenatal care.  She is currently monitored for the following issues for this high-risk pregnancy and has Irritable bowel syndrome with diarrhea; Essential hypertension; Recurrent major depressive disorder, in partial remission ([place]; Gastroesophageal reflux disease; Chronic lower back pain; History of migraine; Chronic pain of both knees; Genital herpes; Mixed hyperlipidemia; Maternal morbid obesity, antepartum ([place]; Supervision of high risk pregnancy, antepartum; BMI 40.0-44.9, adult ([place]; Chronic hypertension affecting pregnancy; Supervision of elderly multigravida in third trimester; Medication exposure during first trimester of pregnancy; LGSIL on Pap smear of cervix; Gestational diabetes mellitus (GDM) in childbirth, diet controlled; Placenta previa antepartum in third trimester; Vaginal bleeding in pregnancy, third trimester; Regular astigmatism; and Anemia in pregnancy, third trimester on their problem list.  Patient reports  continued spotting with wiping [facility], but states this morning she passed a clot in the toilet, but no active bleeding with just spotting .  Contractions: Not present. Vag. Bleeding: Small.  Movement: Present. Denies leaking of fluid.   The following portions of the patient's history were reviewed and updated as appropriate: allergies, current medications, past family history, past medical history, past social history, past surgical history and problem list.   Objective:   Vitals:   07/12/22 0845  BP: 130/84  Pulse: 96  Weight: 243 lb (110.2 kg)    Fetal Status: Fetal Heart Rate (bpm): 144   Movement: Present     General:  Alert, oriented and cooperative. Patient is in no acute distress.  Skin: Skin is warm and dry. No rash noted.   Cardiovascular: Normal heart rate noted  Respiratory: Normal respiratory effort, no  problems with respiration noted  Abdomen: Soft, gravid, appropriate for gestational age.  Pain/Pressure: Absent     Pelvic: Cervical exam performed in the presence of a chaperone       EGBUS normal Vaginal vault: large clot in vault, approximately 248mCervix: no active bleeding once clot removed. Cervix visually closed/long, non tender Uterus: non tender  Extremities: Normal range of motion.  Edema: None  Mental Status: Normal mood and affect. Normal behavior. Normal judgment and thought content.   Assessment and Plan:  Pregnancy: G2P1001 at 3176w1d [redacted] weeks gestation of pregnancy  2. Vaginal bleeding in pregnancy, third trimester Recommend going to hospital for repeat assessment  [place] [facility] [place] after presenting with VB. Concern for ROM due to +swab testing (pt was having bleeding) but serial AFI stable in the high teens. She received bmz on 11/3 and 11/4 and got Mg there, as well and abx due to concern for ROM. 11/7 transvag u/s showed concern for persistent anterior previa or large clot over the cervical os. Efw 58%, 1766gm, ac 78%, afi 20.7  Given amount of bleeding, recommend patient go to hospital for further assessment. Pt put on monitor for 20 minutes and toco ?q7m 29m and baby reactive so okay to go by private vehicle.   3. Chronic hypertension affecting pregnancy Doing well on labetalol 200 bid.   4. Gestational diabetes mellitus (GDM) in childbirth, diet controlled CBGs not reviewed today  5. Placenta previa antepartum in third trimester  6. Anemia in pregnancy, third trimester S/p venofer on 11/6    Latest Ref Rng & Units 07/10/2022    6:31 AM 07/09/2022    4:08 AM 07/08/2022    6:10 AM  CBC  WBC 4.0 - 10.5  K/uL 17.9  22.5  23.5   Hemoglobin 12.0 - 15.0 g/dL 10.4  9.4  10.3   Hematocrit 36.0 - 46.0 % 30.3  28.0  30.4   Platelets 150 - 400 K/uL 265  261  276    7. BMI 40.0-44.9, adult ([place])  8. History of HSV  Preterm labor symptoms and  general obstetric precautions including but not limited to vaginal bleeding, contractions, leaking of fluid and fetal movement were reviewed in detail with the patient. Please refer to After Visit Summary for other counseling recommendations.   No follow-ups on file.  Future Appointments  Date Time Provider [place]  07/19/2022  1:15 PM [facility] NURSE [facility]  07/19/2022  1:30 PM [facility] US3 [facility]  07/24/2022  2:00 PM Jearld Fenton, NP [facility]  07/25/2022 11:15 AM [name], MD [facility]  07/25/2022  2:30 PM [facility] NURSE [facility]  07/25/2022  2:45 PM [facility] US4 [facility]  08/01/2022  2:15 PM [facility] NURSE [facility]  08/01/2022  2:30 PM [facility] US2 [facility]  08/08/2022  1:30 PM [name], MD [facility]  08/22/2022  1:30 PM Aletha Halim, MD [facility]  08/31/2022 10:35 AM Anyanwu, Sallyanne Havers, MD [facility]    Aletha Halim, MD

## 2022-07-12 NOTE — Progress Notes (Signed)
ROB 108w1d CC: pt had hospital stay for vaginal bleeding . Today having spotting more with wiping  pt passed a small clot this morning that look like old blood.

## 2022-07-12 NOTE — MAU Note (Signed)
Brandi [name] is a 36 y.o. at 60w1dhere in MAU reporting: known previa with bleeding, sent from clinic for eval, probable transfer  out due to NICU status   Pt taken directly to rm

## 2022-07-13 ENCOUNTER — Other Ambulatory Visit: Payer: Self-pay | Admitting: Obstetrics and Gynecology

## 2022-07-19 ENCOUNTER — Ambulatory Visit: Payer: BC Managed Care – PPO

## 2022-07-20 ENCOUNTER — Ambulatory Visit (INDEPENDENT_AMBULATORY_CARE_PROVIDER_SITE_OTHER): Payer: BC Managed Care – PPO | Admitting: *Deleted

## 2022-07-20 MED ORDER — NIFEDIPINE ER OSMOTIC RELEASE 30 MG PO TB24: 30.0000 mg | ORAL_TABLET | Freq: Every day | ORAL | 2 refills | Status: DC

## 2022-07-20 NOTE — Progress Notes (Signed)
Subjective:  Brandi [name] is a 36 y.o. female here for BP check and incision check.  Hypertension ROS: taking medications as instructed, no medication side effects noted, no TIA's, no chest pain on exertion, no dyspnea on exertion, and no swelling of ankles.   Pt reports incision healing well  Objective:  BP (!) 138/91   Pulse 85   Breastfeeding Unknown   Appearance alert, well appearing, and in no distress. Incision healing well   Assessment:   Blood Pressure today in office reasonably well controlled. Discussed provider may change her BP meds now that she is delivered.  Incision healed nicely, no signs of infection, scar looks good. Wound care discussed.  Plan:  Will change BP med to Procardia '30mg'$  daily and have pt come back in one week for BP check. Crosby Oyster, RN

## 2022-07-24 ENCOUNTER — Encounter: Payer: BC Managed Care – PPO | Admitting: Internal Medicine

## 2022-07-25 ENCOUNTER — Ambulatory Visit: Payer: BC Managed Care – PPO

## 2022-07-25 ENCOUNTER — Encounter: Payer: BC Managed Care – PPO | Admitting: Family Medicine

## 2022-07-31 ENCOUNTER — Encounter: Payer: Self-pay | Admitting: Obstetrics and Gynecology

## 2022-08-01 ENCOUNTER — Ambulatory Visit: Payer: BC Managed Care – PPO

## 2022-08-03 ENCOUNTER — Ambulatory Visit (INDEPENDENT_AMBULATORY_CARE_PROVIDER_SITE_OTHER): Payer: BC Managed Care – PPO | Admitting: Internal Medicine

## 2022-08-03 ENCOUNTER — Encounter: Payer: Self-pay | Admitting: Internal Medicine

## 2022-08-03 VITALS — BP 138/84 | HR 104 | Temp 96.5°F | Ht 65.0 in | Wt 233.0 lb

## 2022-08-03 DIAGNOSIS — Z23 Encounter for immunization: Secondary | ICD-10-CM

## 2022-08-03 DIAGNOSIS — Z0001 Encounter for general adult medical examination with abnormal findings: Secondary | ICD-10-CM

## 2022-08-03 DIAGNOSIS — Z6838 Body mass index (BMI) 38.0-38.9, adult: Secondary | ICD-10-CM | POA: Diagnosis not present

## 2022-08-03 NOTE — Addendum Note (Signed)
Addended by: Ashley Royalty E on: 08/03/2022 03:26 PM   Modules accepted: Orders

## 2022-08-03 NOTE — Assessment & Plan Note (Signed)
Encouraged diet and exercise for weight loss ?

## 2022-08-03 NOTE — Progress Notes (Signed)
Subjective:    Patient ID: Brandi [name], female    DOB: 03-Jul-1986, 36 y.o.   MRN: [id number]  HPI  Patient presents to clinic today for annual exam.  Flu: never Tetanus: unsure COVID: Pfizer x2 Pap smear: 02/2022, abnormal (follows with GYN) Dentist: as needed  Diet: She does eat meat. She consumes fruits and veggies. She does eat some fruits and veggies. She drinks mostly soda and water. Exercise: None   Review of Systems     Past Medical History:  Diagnosis Date   Abnormal Pap smear of cervix 12/2015   ascus/pos- achd   Allergies    Arthritis    Asthma    Chlamydial cervicitis    Depression    pt has past treatment w/ Zoloft - good symptom control, but worsening IBS symptoms   Diabetes mellitus without complication (HCC)    Genital HSV    GERD (gastroesophageal reflux disease)    Heart murmur    Hypertension    IBS (irritable bowel syndrome)    Prediabetes 01/19/2022   _0  early GTT   Pyelonephritis    Thrombocytosis 07/20/2021   Trichomoniasis     Current Outpatient Medications  Medication Sig Dispense Refill   Accu-Chek Softclix Lancets lancets 1 each by Other route 4 (four) times daily. 100 each 12   Albuterol Sulfate 108 (90 Base) MCG/ACT AEPB Inhale into the lungs.     aspirin EC 81 MG tablet Take 1 tablet (81 mg total) by mouth daily. 60 tablet 1   Blood Glucose Monitoring Suppl (ACCU-CHEK GUIDE) w/Device KIT 1 Device by Does not apply route 4 (four) times daily. 1 kit 0   cyclobenzaprine (FLEXERIL) 10 MG tablet Take 1 tablet (10 mg total) by mouth 2 (two) times daily as needed for muscle spasms. 20 tablet 0   fluticasone (FLONASE) 50 MCG/ACT nasal spray Place 1 spray into both nostrils 2 (two) times daily. 16 g 0   glucose blood (ACCU-CHEK GUIDE) test strip Use to check blood sugars four times a day was instructed 50 each 12   hydrocortisone (ANUSOL-HC) 2.5 % rectal cream Place 1 application rectally 2 (two) times daily as needed for hemorrhoids or  anal itching. 30 g 0   labetalol (NORMODYNE) 200 MG tablet Take 1 tablet (200 mg total) by mouth 2 (two) times daily. 60 tablet 3   loratadine (CLARITIN) 10 MG tablet Take 10 mg by mouth daily as needed for allergies.     Magnesium Oxide (MAGNESIUM OXIDE 400) 240 MG PACK Take 1 tablet by mouth daily as needed (headache). 30 each 1   Multiple Vitamins-Minerals (WOMENS DAILY FORMULA PO) Take by mouth.     NIFEdipine (PROCARDIA-XL/NIFEDICAL-XL) 30 MG 24 hr tablet Take 1 tablet (30 mg total) by mouth daily. 30 tablet 2   omeprazole (PRILOSEC) 20 MG capsule Take 1 capsule (20 mg total) by mouth daily. 90 capsule 1   valACYclovir (VALTREX) 500 MG tablet 1 TABLET TWICE A DAY X 3 DAYS AS NEEDED FOR OUTBREAKS 30 tablet 0   No current facility-administered medications for this visit.    Allergies  Allergen Reactions   Viberzi [Eluxadoline] Itching   Amoxicillin Nausea And Vomiting and Rash    Family History  Problem Relation Age of Onset   Diabetes Mother    Hypertension Mother    Asthma Father    Heart disease Father    Heart attack Father    Asthma Sister    Hypertension Sister  ADD / ADHD Son    Cancer Maternal Aunt    Cancer Maternal Uncle    Cancer Maternal Grandmother    Lung cancer Maternal Grandmother    Heart disease Maternal Grandfather    Lung cancer Paternal Grandmother    Heart disease Paternal Grandfather    Birth defects Neg Hx    Stroke Neg Hx     Social History   Socioeconomic History   Marital status: Single    Spouse name: Not on file   Number of children: 1   Years of education: Not on file   Highest education level: Some college, no degree  Occupational History   Occupation: Architect    Comment: [employer]  Tobacco Use   Smoking status: Former    Packs/day: 1.00    Years: 12.00    Total pack years: 12.00    Types: Cigarettes    Quit date: 12/07/2021    Years since quitting: 0.6   Smokeless tobacco: Never  Vaping Use   Vaping Use: Former    Substances: Nicotine, Flavoring  Substance and Sexual Activity   Alcohol use: Not Currently    Alcohol/week: 1.0 standard drink of alcohol    Types: 1 Glasses of wine per week    Comment: rare   Drug use: Not Currently    Frequency: 7.0 times per week    Types: Marijuana    Comment: last use April 2023   Sexual activity: Not Currently  Other Topics Concern   Not on file  Social History Narrative   Not on file   Social Determinants of Health   Financial Resource Strain: Low Risk  (10/22/2018)   Overall Financial Resource Strain (CARDIA)    Difficulty of Paying Living Expenses: Not very hard  Food Insecurity: No Food Insecurity (07/07/2022)   Hunger Vital Sign    Worried About Running Out of Food in the Last Year: Never true    Ran Out of Food in the Last Year: Never true  Transportation Needs: No Transportation Needs (07/07/2022)   PRAPARE - Hydrologist (Medical): No    Lack of Transportation (Non-Medical): No  Physical Activity: Not on file  Stress: Not on file  Social Connections: Not on file  Intimate Partner Violence: Not At Risk (07/07/2022)   Humiliation, Afraid, Rape, and Kick questionnaire    Fear of Current or Ex-Partner: No    Emotionally Abused: No    Physically Abused: No    Sexually Abused: No     Constitutional: Denies fever, malaise, fatigue, headache or abrupt weight changes.  HEENT: Denies eye pain, eye redness, ear pain, ringing in the ears, wax buildup, runny nose, nasal congestion, bloody nose, or sore throat. Respiratory: Denies difficulty breathing, shortness of breath, cough or sputum production.   Cardiovascular: Denies chest pain, chest tightness, palpitations or swelling in the hands or feet.  Gastrointestinal: Patient reports intermittent diarrhea.  Denies abdominal pain, bloating, constipation, or blood in the stool.  GU: Denies urgency, frequency, pain with urination, burning sensation, blood in urine, odor or  discharge. Musculoskeletal: Patient reports chronic joint pain.  Denies decrease in range of motion, difficulty with gait,  or joint swelling.  Skin: Denies redness, rashes, lesions or ulcercations.  Neurological: Denies dizziness, difficulty with memory, difficulty with speech or problems with balance and coordination.  Psych: Patient has a history of depression.  Denies anxiety, SI/HI.  No other specific complaints in a complete review of systems (except as  listed in HPI above).  Objective:   Physical Exam   BP 138/84 (BP Location: Right Arm, Patient Position: Sitting, Cuff Size: Normal)   Pulse (!) 104   Temp (!) 96.5 F (35.8 C) (Temporal)   Ht _0  (1.651 m)   Wt 233 lb (105.7 kg)   SpO2 99%   BMI 38.77 kg/m    Wt Readings from Last 3 Encounters:  07/12/22 243 lb (110.2 kg)  07/06/22 245 lb (111.1 kg)  06/27/22 246 lb (111.6 kg)    General: Appears her stated age, obese, in NAD. Skin: Warm, dry and intact.  HEENT: Head: normal shape and size; Eyes: sclera white, no icterus, conjunctiva pink, PERRLA and EOMs intact;  Neck:  Neck supple, trachea midline. No masses, lumps or thyromegaly present.  Cardiovascular:Tachycardic with normal rhythm. S1,S2 noted.  No murmur, rubs or gallops noted. No JVD or BLE edema. No carotid bruits noted. Pulmonary/Chest: Normal effort and positive vesicular breath sounds. No respiratory distress. No wheezes, rales or ronchi noted.  Abdomen: Normal bowel sounds.  Musculoskeletal: Strength 5/5 BUE/BLE. No difficulty with gait.  Neurological: Alert and oriented. Cranial nerves II-XII grossly intact. Coordination normal.  Psychiatric: Mood and affect normal. Behavior is normal. Judgment and thought content normal.    BMET    Component Value Date/Time   NA 138 01/19/2022 1414   K 3.8 01/19/2022 1414   CL 107 01/19/2022 1414   CO2 21 01/19/2022 1414   GLUCOSE 141 (H) 01/19/2022 1414   BUN 6 (L) 01/19/2022 1414   CREATININE 0.58 01/19/2022  1414   CALCIUM 9.0 01/19/2022 1414   GFRNONAA 112 05/30/2020 1051   GFRAA 130 05/30/2020 1051    Lipid Panel     Component Value Date/Time   CHOL 190 01/19/2022 1414   TRIG 212 (H) 01/19/2022 1414   HDL 43 (L) 01/19/2022 1414   CHOLHDL 4.4 01/19/2022 1414   LDLCALC 114 (H) 01/19/2022 1414    CBC    Component Value Date/Time   WBC 23.0 (H) 07/12/2022 1221   RBC 3.76 (L) 07/12/2022 1221   HGB 11.8 (L) 07/12/2022 1221   HGB 11.9 06/27/2022 1529   HCT 35.6 (L) 07/12/2022 1221   HCT 35.7 06/27/2022 1529   PLT 318 07/12/2022 1221   PLT 295 06/27/2022 1529   MCV 94.7 07/12/2022 1221   MCV 94 06/27/2022 1529   MCH 31.4 07/12/2022 1221   MCHC 33.1 07/12/2022 1221   RDW 14.4 07/12/2022 1221   RDW 13.2 06/27/2022 1529   LYMPHSABS 2.8 07/06/2022 2341   LYMPHSABS 2.1 02/22/2022 1520   MONOABS 1.0 07/06/2022 2341   EOSABS 0.4 07/06/2022 2341   EOSABS 0.1 02/22/2022 1520   BASOSABS 0.0 07/06/2022 2341   BASOSABS 0.0 02/22/2022 1520    Hgb A1C Lab Results  Component Value Date   HGBA1C 5.6 06/27/2022           Assessment & Plan:    Preventative Health Maintenance:  She declines flu shot today Tetanus today Encouraged her get her COVID booster She will have her Pap repeated by GYN Encouraged her to consume a balanced diet and exercise regimen Advised her to see an eye doctor and dentist annually We will check CBC, c-Met lipid profile today  RTC in 6 months, follow-up chronic conditions Webb Silversmith, NP

## 2022-08-03 NOTE — Patient Instructions (Signed)

## 2022-08-04 ENCOUNTER — Encounter: Payer: Self-pay | Admitting: Internal Medicine

## 2022-08-04 LAB — COMPLETE METABOLIC PANEL WITH GFR
AG Ratio: 1.4 (calc) (ref 1.0–2.5)
ALT: 21 U/L (ref 6–29)
AST: 14 U/L (ref 10–30)
Albumin: 4.2 g/dL (ref 3.6–5.1)
Alkaline phosphatase (APISO): 90 U/L (ref 31–125)
BUN: 8 mg/dL (ref 7–25)
CO2: 23 mmol/L (ref 20–32)
Calcium: 9.5 mg/dL (ref 8.6–10.2)
Chloride: 106 mmol/L (ref 98–110)
Creat: 0.92 mg/dL (ref 0.50–0.97)
Globulin: 2.9 g/dL (calc) (ref 1.9–3.7)
Glucose, Bld: 111 mg/dL — ABNORMAL HIGH (ref 65–99)
Potassium: 4.6 mmol/L (ref 3.5–5.3)
Sodium: 139 mmol/L (ref 135–146)
Total Bilirubin: 0.3 mg/dL (ref 0.2–1.2)
Total Protein: 7.1 g/dL (ref 6.1–8.1)
eGFR: 83 mL/min/{1.73_m2} (ref >=60)

## 2022-08-04 LAB — CBC
HCT: 38.5 % (ref 35.0–45.0)
Hemoglobin: 12.8 g/dL (ref 11.7–15.5)
MCH: 30.6 pg (ref 27.0–33.0)
MCHC: 33.2 g/dL (ref 32.0–36.0)
MCV: 92.1 fL (ref 80.0–100.0)
MPV: 10.8 fL (ref 7.5–12.5)
Platelets: 465 10*3/uL — ABNORMAL HIGH (ref 140–400)
RBC: 4.18 10*6/uL (ref 3.80–5.10)
RDW: 12.9 % (ref 11.0–15.0)
WBC: 8.6 10*3/uL (ref 3.8–10.8)

## 2022-08-04 LAB — LIPID PANEL
Cholesterol: 249 mg/dL — ABNORMAL HIGH (ref <200)
HDL: 42 mg/dL — ABNORMAL LOW (ref >=50)
LDL Cholesterol (Calc): 161 mg/dL (calc) — ABNORMAL HIGH
Non-HDL Cholesterol (Calc): 207 mg/dL (calc) — ABNORMAL HIGH (ref <130)
Total CHOL/HDL Ratio: 5.9 (calc) — ABNORMAL HIGH (ref <5.0)
Triglycerides: 283 mg/dL — ABNORMAL HIGH (ref <150)

## 2022-08-08 ENCOUNTER — Ambulatory Visit (INDEPENDENT_AMBULATORY_CARE_PROVIDER_SITE_OTHER): Payer: BC Managed Care – PPO | Admitting: Family Medicine

## 2022-08-08 ENCOUNTER — Encounter: Payer: Self-pay | Admitting: Family Medicine

## 2022-08-08 ENCOUNTER — Encounter: Payer: BC Managed Care – PPO | Admitting: Family Medicine

## 2022-08-08 DIAGNOSIS — R87612 Low grade squamous intraepithelial lesion on cytologic smear of cervix (LGSIL): Secondary | ICD-10-CM

## 2022-08-08 NOTE — Progress Notes (Signed)
[employer] Partum Visit Note  Brandi [name] is a 36 y.o. G36P1001 female who presents for a postpartum visit. She is 4 weeks postpartum following a primary cesarean section.  I have fully reviewed the prenatal and intrapartum course. The delivery was at 31.1 gestational weeks.  Anesthesia: spinal. Postpartum course has been complicated. Baby is doing well at NICU at [facility]. Baby is feeding by both breast and bottle - Similac . Bleeding no bleeding. Bowel function is normal. Bladder function is normal. Patient is sexually active. Contraception method is  using condoms now and would like a tubal . Postpartum depression screening: negative.   The pregnancy intention screening data noted above was reviewed. Potential methods of contraception were discussed. The patient elected to proceed with No data recorded.   Edinburgh Postnatal Depression Scale - 08/08/22 1349       Edinburgh Postnatal Depression Scale:  In the Past 7 Days   I have been able to laugh and see the funny side of things. 0    I have looked forward with enjoyment to things. 0    I have blamed myself unnecessarily when things went wrong. 1    I have been anxious or worried for no good reason. 2    I have felt scared or panicky for no good reason. 2    Things have been getting on top of me. 1    I have been so unhappy that I have had difficulty sleeping. 0    I have felt sad or miserable. 1    I have been so unhappy that I have been crying. 1    The thought of harming myself has occurred to me. 0    Edinburgh Postnatal Depression Scale Total 8             There are no preventive care reminders to display for this patient.  The following portions of the patient's history were reviewed and updated as appropriate: allergies, current medications, past family history, past medical history, past social history, past surgical history, and problem list.  Review of Systems Pertinent items noted in HPI and remainder of  comprehensive ROS otherwise negative.  Objective:  BP 138/89   Pulse (!) 105   Wt 234 lb (106.1 kg)   Breastfeeding No   BMI 38.94 kg/m    General:  alert, cooperative, and appears stated age  Lungs: Normal effort  Heart:  regular rate and rhythm  Abdomen: soft, non-tender; bowel sounds normal; no masses,  no organomegaly   Wound well approximated incision       Assessment:   Normal postpartum exam.   Plan:   Essential components of care per ACOG recommendations:  1.  Mood and well being: Patient with equivocal depression screening today. Reviewed local resources for support.  - Patient tobacco use? Yes. Patient desires to quit? Yes.Discussed reduction and cessation and higher likelihood of postpartum relapse  - hx of drug use? No.    2. Infant care and feeding:  -Patient currently breastmilk feeding? Yes. Discussed returning to work and pumping.  -Social determinants of health ([place]) reviewed in [place]. No concerns  3. Sexuality, contraception and birth spacing - Patient does not want a pregnancy in the next year.  Desired family size is 2 children.  - Reviewed reproductive life planning. Reviewed contraceptive methods based on pt preferences and effectiveness.  Patient desired Female Sterilization today.   - Discussed birth spacing of 18 months  4. Sleep and fatigue -Encouraged  family/partner/community support of 4 hrs of uninterrupted sleep to help with mood and fatigue  5. Physical Recovery  - Discussed patients delivery and complications. She describes her labor as good. - Patient had a C-section.  Perineal healing reviewed. Patient expressed understanding - Patient has urinary incontinence? No. - Patient is safe to resume physical and sexual activity  6.  Health Maintenance - HM due items addressed Yes - Last pap smear  Diagnosis  Date Value Ref Range Status  02/22/2022 - Low grade squamous intraepithelial lesion (LSIL) (A)  Final   Pap smear not done at  today's visit. Needs colpo -Breast Cancer screening indicated? No.   7. Chronic Disease/Pregnancy Condition follow up: Hypertension  - PCP follow up Return in about 4 weeks (around 09/05/2022) for colpo.  [name], MD Center for [employer], [place]

## 2022-08-15 ENCOUNTER — Encounter: Payer: Self-pay | Admitting: *Deleted

## 2022-08-18 ENCOUNTER — Encounter: Payer: Self-pay | Admitting: Obstetrics and Gynecology

## 2022-08-22 ENCOUNTER — Encounter: Payer: BC Managed Care – PPO | Admitting: Obstetrics and Gynecology

## 2022-08-31 ENCOUNTER — Encounter: Payer: BC Managed Care – PPO | Admitting: Obstetrics & Gynecology

## 2022-09-05 ENCOUNTER — Encounter: Payer: Self-pay | Admitting: Family Medicine

## 2022-09-05 ENCOUNTER — Ambulatory Visit (INDEPENDENT_AMBULATORY_CARE_PROVIDER_SITE_OTHER): Payer: BC Managed Care – PPO | Admitting: Family Medicine

## 2022-09-05 ENCOUNTER — Other Ambulatory Visit (HOSPITAL_COMMUNITY)
Admission: RE | Admit: 2022-09-05 | Discharge: 2022-09-05 | Disposition: A | Discharge status: Home / Self Care | Payer: BC Managed Care – PPO | Source: Ambulatory Visit | Attending: Family Medicine | Admitting: Family Medicine

## 2022-09-05 VITALS — BP 137/97 | HR 82

## 2022-09-05 DIAGNOSIS — R87810 Cervical high risk human papillomavirus (HPV) DNA test positive: Secondary | ICD-10-CM | POA: Diagnosis not present

## 2022-09-05 DIAGNOSIS — R87612 Low grade squamous intraepithelial lesion on cytologic smear of cervix (LGSIL): Secondary | ICD-10-CM | POA: Insufficient documentation

## 2022-09-05 DIAGNOSIS — Z01812 Encounter for preprocedural laboratory examination: Secondary | ICD-10-CM | POA: Diagnosis not present

## 2022-09-05 DIAGNOSIS — Z8632 Personal history of gestational diabetes: Secondary | ICD-10-CM | POA: Insufficient documentation

## 2022-09-05 LAB — POCT URINE PREGNANCY: Preg Test, Ur: NEGATIVE

## 2022-09-05 NOTE — Addendum Note (Signed)
Addended by: Crosby Oyster on: 09/05/2022 03:53 PM   Modules accepted: Orders

## 2022-09-05 NOTE — Progress Notes (Addendum)
    GYNECOLOGY OFFICE COLPOSCOPY PROCEDURE NOTE  37 y.o. [id number] here for colposcopy for low-grade squamous intraepithelial neoplasia (LGSIL - encompassing HPV,mild dysplasia,CIN I) pap smear on 02/22/2022. Discussed role for HPV in cervical dysplasia, need for surveillance.  Patient gave informed written consent, time out was performed.  Placed in lithotomy position. Cervix viewed with speculum and colposcope after application of acetic acid.   Colposcopy adequate? Yes  visible lesion(s) at SCJ; 4 quadrant biopsies obtained.  ECC specimen obtained. All specimens were labeled and sent to pathology.  Chaperone was present during entire procedure.  Patient was given post procedure instructions.  Will follow up pathology and manage accordingly; patient will be contacted with results and recommendations.  Routine preventative health maintenance measures emphasized.  Needs 2 hour. Book for tubal.  [name], MD 09/05/2022 2:07 PM

## 2022-09-06 ENCOUNTER — Encounter: Payer: Self-pay | Admitting: Family Medicine

## 2022-09-06 ENCOUNTER — Telehealth: Payer: Self-pay | Admitting: Family Medicine

## 2022-09-06 LAB — SURGICAL PATHOLOGY

## 2022-09-06 NOTE — Telephone Encounter (Signed)
Telephone call to patient with surgery date/time/instructions.   Posted for 09/11/22 at 10:00 am at [facility].

## 2022-09-06 NOTE — Telephone Encounter (Signed)
Telephone call to patient with revised surgery date/time.    Case moved due to [place] papers were not 23 days old.

## 2022-09-10 ENCOUNTER — Other Ambulatory Visit: Payer: BC Managed Care – PPO

## 2022-09-10 DIAGNOSIS — Z8632 Personal history of gestational diabetes: Secondary | ICD-10-CM

## 2022-09-10 DIAGNOSIS — O24420 Gestational diabetes mellitus in childbirth, diet controlled: Secondary | ICD-10-CM

## 2022-09-11 LAB — GLUCOSE TOLERANCE, 2 HOURS
Glucose, 2 hour: 115 mg/dL (ref 70–139)
Glucose, GTT - Fasting: 89 mg/dL (ref 70–99)

## 2022-09-17 DIAGNOSIS — Z302 Encounter for sterilization: Secondary | ICD-10-CM

## 2022-09-17 NOTE — H&P (Signed)
Brandi [name] is an 37 y.o. G29P1101 female.   Chief Complaint: undesired fertility HPI: Patient is recently pp and desires permanent sterilization.  Past Medical History:  Diagnosis Date   Abnormal Pap smear of cervix 12/2015   ascus/pos- achd   Allergies    Arthritis    Asthma    Chlamydial cervicitis    Depression    pt has past treatment w/ Zoloft - good symptom control, but worsening IBS symptoms   Diabetes mellitus without complication (HCC)    Genital HSV    GERD (gastroesophageal reflux disease)    Heart murmur    Hypertension    IBS (irritable bowel syndrome)    Prediabetes 01/19/2022   '[ ]'$  early GTT   Pyelonephritis    Thrombocytosis 07/20/2021   Trichomoniasis     Past Surgical History:  Procedure Laterality Date   CESAREAN SECTION     COLONOSCOPY WITH PROPOFOL N/A 12/29/2020   Procedure: COLONOSCOPY WITH BIOPSY;  Surgeon: Lin Landsman, MD;  Location: [place];  Service: Endoscopy;  Laterality: N/A;   COLPOSCOPY  04/17/2022   ESOPHAGOGASTRODUODENOSCOPY (EGD) WITH PROPOFOL N/A 12/29/2020   Procedure: ESOPHAGOGASTRODUODENOSCOPY (EGD) WITH BIOPSY;  Surgeon: Lin Landsman, MD;  Location: [place];  Service: Endoscopy;  Laterality: N/A;   POLYPECTOMY N/A 12/29/2020   Procedure: POLYPECTOMY;  Surgeon: Lin Landsman, MD;  Location: [place];  Service: Endoscopy;  Laterality: N/A;   WISDOM TOOTH EXTRACTION      Family History  Problem Relation Age of Onset   Diabetes Mother    Hypertension Mother    Asthma Father    Heart disease Father    Heart attack Father    Asthma Sister    Hypertension Sister    ADD / ADHD Son    Cancer Maternal Aunt    Cancer Maternal Uncle    Cancer Maternal Grandmother    Lung cancer Maternal Grandmother    Heart disease Maternal Grandfather    Lung cancer Paternal Grandmother    Heart disease Paternal Grandfather    Birth defects Neg Hx    Stroke Neg Hx    Social History:   reports that she quit smoking about 9 months ago. Her smoking use included cigarettes. She has a 12.00 pack-year smoking history. She has never used smokeless tobacco. She reports that she does not currently use alcohol after a past usage of about 1.0 standard drink of alcohol per week. She reports that she does not currently use drugs after having used the following drugs: Marijuana. Frequency: 7.00 times per week.  Allergies:  Allergies  Allergen Reactions   Viberzi [Eluxadoline] Itching   Amoxicillin Nausea And Vomiting and Rash    No medications prior to admission.    A comprehensive review of systems was negative.  not currently breastfeeding. General appearance: alert, cooperative, appears stated age, and moderately obese Head: Normocephalic, without obvious abnormality, atraumatic Neck: supple, symmetrical, trachea midline Lungs:  normal effort Heart: regular rate and rhythm Abdomen: soft, non-tender; bowel sounds normal; no masses,  no organomegaly Extremities: extremities normal, atraumatic, no cyanosis or edema Skin: Skin color, texture, turgor normal. No rashes or lesions Neurologic: Grossly normal   No results found for this or any previous visit (from the past 24 hour(s)). No results found.  Assessment/Plan Active Problems:   Encounter for sterilization  For laparoscopic bilateral sterilization. Patient counseled, r.e. Risks benefits of BTL, including permanency of procedure, discussed benefits of salpingectomy including almost no failure  and prevention of ovarian cancer.  Patient verbalized understanding and desires to proceed  Risks include but are not limited to bleeding, infection, injury to surrounding structures, including bowel, bladder and ureters, blood clots, and death.  Likelihood of success is high.    Brandi [name] 09/17/2022, 4:27 PM

## 2022-09-19 ENCOUNTER — Encounter (HOSPITAL_BASED_OUTPATIENT_CLINIC_OR_DEPARTMENT_OTHER): Payer: Self-pay | Admitting: Family Medicine

## 2022-09-19 NOTE — Progress Notes (Signed)
Spoke w/ via phone for pre-op interview---Brandi [name] needs dos----  UPT and CBC per surgeon. EKG per anesthesia.             [name] results------ COVID test -----patient states asymptomatic no test needed Arrive at -------1250 NPO after MN NO Solid Food.  Clear liquids from MN until---1150 Med rec completed Medications to take morning of surgery -----Nifedipine and Omeprazole Diabetic medication ----- Patient instructed no nail polish to be worn day of surgery Patient instructed to bring photo id and insurance card day of surgery Patient aware to have Driver (ride ) / caregiver   Mother Brandi [name] for 24 hours after surgery  Patient Special Instructions ----- Pre-Op special Istructions ----- Patient verbalized understanding of instructions that were given at this phone interview. Patient denies shortness of breath, chest pain, fever, cough at this phone interview.

## 2022-09-21 ENCOUNTER — Encounter: Payer: Self-pay | Admitting: Obstetrics and Gynecology

## 2022-09-25 ENCOUNTER — Ambulatory Visit (HOSPITAL_BASED_OUTPATIENT_CLINIC_OR_DEPARTMENT_OTHER): Admission: RE | Admit: 2022-09-25 | Payer: BC Managed Care – PPO | Source: Home / Self Care | Admitting: Family Medicine

## 2022-09-25 DIAGNOSIS — Z302 Encounter for sterilization: Secondary | ICD-10-CM

## 2022-09-25 DIAGNOSIS — I10 Essential (primary) hypertension: Secondary | ICD-10-CM

## 2022-09-25 SURGERY — SALPINGECTOMY, BILATERAL, LAPAROSCOPIC
Anesthesia: General | Laterality: Bilateral

## 2022-10-02 ENCOUNTER — Encounter: Payer: Self-pay | Admitting: Obstetrics and Gynecology

## 2022-10-02 ENCOUNTER — Ambulatory Visit (INDEPENDENT_AMBULATORY_CARE_PROVIDER_SITE_OTHER): Payer: BC Managed Care – PPO | Admitting: Obstetrics and Gynecology

## 2022-10-02 ENCOUNTER — Other Ambulatory Visit (HOSPITAL_COMMUNITY)
Admission: RE | Admit: 2022-10-02 | Discharge: 2022-10-02 | Disposition: A | Discharge status: Home / Self Care | Payer: BC Managed Care – PPO | Source: Ambulatory Visit | Attending: Obstetrics and Gynecology | Admitting: Obstetrics and Gynecology

## 2022-10-02 VITALS — BP 137/87 | HR 97 | Ht 65.0 in | Wt 230.8 lb

## 2022-10-02 DIAGNOSIS — N920 Excessive and frequent menstruation with regular cycle: Secondary | ICD-10-CM

## 2022-10-02 DIAGNOSIS — N946 Dysmenorrhea, unspecified: Secondary | ICD-10-CM | POA: Insufficient documentation

## 2022-10-02 NOTE — Progress Notes (Addendum)
Obstetrics and Gynecology New Patient Evaluation  Appointment Date: 10/02/2022  OBGYN Clinic: [facility] PCP:  [place]  Referring Provider: self  Chief Complaint: desires hysterectomy for heavy and painful periods  History of Present Illness: Brandi [name] is a 37 y.o. Caucasian G2P2 (Patient's last menstrual period was 09/27/2022.), seen for the above chief complaint. Her past medical history is significant for HTN, BMI 38, h/o c-section x 1.   Patient with heavy and painful periods that last for 10 days. She was to get a BTL with Dr. Kennon Rounds but cancelled due to concern that BTL could worsen her periods. She has used nexplanon, mirena and depo and the latter did help but concerned on how it made her mood. She states her periods were bad prior to this most recent pregnancy and worse since the c-section.   Review of Systems: A comprehensive review of systems was negative.   Patient Active Problem List   Diagnosis Date Noted   Encounter for sterilization 09/17/2022   History of gestational diabetes 09/05/2022   Class 2 obesity due to excess calories with body mass index (BMI) of 38.0 to 38.9 in adult 02/22/2022   Mixed hyperlipidemia 07/20/2021   Genital herpes 02/03/2021   Chronic lower back pain 05/30/2020   History of migraine 05/30/2020   Chronic pain of both knees 05/30/2020   Irritable bowel syndrome with diarrhea 10/22/2018   Essential hypertension 10/22/2018   Recurrent major depressive disorder, in partial remission ([place]) 10/22/2018   Gastroesophageal reflux disease 10/22/2018    Past Medical History:  Past Medical History:  Diagnosis Date   Abnormal Pap smear of cervix 12/2015   ascus/pos- achd   Allergies    Arthritis    Asthma    Chlamydial cervicitis    Depression    pt has past treatment w/ Zoloft - good symptom control, but worsening IBS symptoms   Genital HSV    GERD (gastroesophageal reflux disease)    Heart  murmur    Hypertension    IBS (irritable bowel syndrome)    Prediabetes 01/19/2022   '[ ]'$  early GTT   Pyelonephritis    Thrombocytosis 07/20/2021   Trichomoniasis     Past Surgical History:  Past Surgical History:  Procedure Laterality Date   CESAREAN SECTION     COLONOSCOPY WITH PROPOFOL N/A 12/29/2020   Procedure: COLONOSCOPY WITH BIOPSY;  Surgeon: Lin Landsman, MD;  Location: [place];  Service: Endoscopy;  Laterality: N/A;   COLPOSCOPY  04/17/2022   ESOPHAGOGASTRODUODENOSCOPY (EGD) WITH PROPOFOL N/A 12/29/2020   Procedure: ESOPHAGOGASTRODUODENOSCOPY (EGD) WITH BIOPSY;  Surgeon: Lin Landsman, MD;  Location: [place];  Service: Endoscopy;  Laterality: N/A;   POLYPECTOMY N/A 12/29/2020   Procedure: POLYPECTOMY;  Surgeon: Lin Landsman, MD;  Location: [place];  Service: Endoscopy;  Laterality: N/A;   WISDOM TOOTH EXTRACTION      Past Obstetrical History:  OB History  Gravida Para Term Preterm AB Living  '2 2 1 1   1  '$ SAB IAB Ectopic Multiple Live Births          1    # Outcome Date GA Lbr Len/2nd Weight Sex Delivery Anes PTL Lv  2 Preterm 07/12/22 [redacted]w[redacted]d 4 lb 4 oz (1.928 kg) M CS-LTranv        Complications: Placenta Previa  1 Term 2009   8 lb 4.8 oz (3.765 kg) M Vag-Spont   LIV    Past Gynecological  History: As per HPI. History of Pap Smear(s): Yes.   Last pap 2022, which was CIN 1 on colposcopy  Social History:  Social History   Socioeconomic History   Marital status: Single    Spouse name: Not on file   Number of children: 1   Years of education: Not on file   Highest education level: Some college, no degree  Occupational History   Occupation: Architect    Comment: [employer]  Tobacco Use   Smoking status: Former    Packs/day: 1.00    Years: 12.00    Total pack years: 12.00    Types: Cigarettes    Quit date: 12/07/2021    Years since quitting: 0.8    Passive exposure: Past   Smokeless tobacco: Never   Vaping Use   Vaping Use: Former   Substances: Nicotine, Flavoring  Substance and Sexual Activity   Alcohol use: Not Currently    Alcohol/week: 1.0 standard drink of alcohol    Types: 1 Glasses of wine per week    Comment: rare   Drug use: Not Currently    Frequency: 7.0 times per week    Types: Marijuana    Comment: last use April 2023   Sexual activity: Not Currently  Other Topics Concern   Not on file  Social History Narrative   Not on file   Social Determinants of Health   Financial Resource Strain: Low Risk  (10/22/2018)   Overall Financial Resource Strain (CARDIA)    Difficulty of Paying Living Expenses: Not very hard  Food Insecurity: No Food Insecurity (07/07/2022)   Hunger Vital Sign    Worried About Running Out of Food in the Last Year: Never true    Ran Out of Food in the Last Year: Never true  Transportation Needs: No Transportation Needs (07/07/2022)   PRAPARE - Hydrologist (Medical): No    Lack of Transportation (Non-Medical): No  Physical Activity: Not on file  Stress: Not on file  Social Connections: Not on file  Intimate Partner Violence: Not At Risk (07/07/2022)   Humiliation, Afraid, Rape, and Kick questionnaire    Fear of Current or Ex-Partner: No    Emotionally Abused: No    Physically Abused: No    Sexually Abused: No    Family History:  Family History  Problem Relation Age of Onset   Diabetes Mother    Hypertension Mother    Asthma Father    Heart disease Father    Heart attack Father    Asthma Sister    Hypertension Sister    ADD / ADHD Son    Cancer Maternal Aunt    Cancer Maternal Uncle    Cancer Maternal Grandmother    Lung cancer Maternal Grandmother    Heart disease Maternal Grandfather    Lung cancer Paternal Grandmother    Heart disease Paternal Grandfather    Birth defects Neg Hx    Stroke Neg Hx     Medications [name] had no medications administered during this visit. Current  Outpatient Medications  Medication Sig Dispense Refill   omeprazole (PRILOSEC) 20 MG capsule Take 1 capsule (20 mg total) by mouth daily. 90 capsule 1   Albuterol Sulfate 108 (90 Base) MCG/ACT AEPB Inhale into the lungs.     No current facility-administered medications for this visit.    Allergies Viberzi [eluxadoline] and Amoxicillin   Physical Exam:  BP 137/87   Pulse 97   Wt  230 lb 12.8 oz (104.7 kg)   LMP 09/27/2022   BMI 38.41 kg/m  Body mass index is 38.41 kg/m. General appearance: Well nourished, well developed female in no acute distress.  Neck:  Supple, normal appearance, and no thyromegaly  Cardiovascular: normal s1 and s2.  No murmurs, rubs or gallops. Respiratory:  Clear to auscultation bilateral. Normal respiratory effort Abdomen: positive bowel sounds and no masses, hernias; diffusely non tender to palpation, non distended Neuro/Psych:  Normal mood and affect.  Skin:  Warm and dry.  Lymphatic:  No inguinal lymphadenopathy.   Pelvic exam: is limited by body habitus EGBUS: within normal limits Vagina: within normal limits and with no blood or discharge in the vault Cervix: normal appearing cervix without tenderness, discharge or lesions.  Uterus:  nonenlarged and non tender, mobile Adnexa:  normal adnexa and no mass, fullness, tenderness Rectovaginal: deferred  Laboratory: none  Radiology: none  Assessment: patient stable  Plan:  1. Dysmenorrhea Long d/w pt re: options including medical options and endometrial ablation and cyclic Lysteda. Op note from c-section reviewed and was negative. I also discussed her re: hysterectomy, with the risks of surgery were discussed in detail with the patient including but not limited to: bleeding which may require transfusion or reoperation; infection which may require prolonged hospitalization or re-hospitalization and antibiotic therapy; injury to bowel, bladder, ureters and major vessels or other surrounding organs which  may lead to other procedures; formation of adhesions; need for additional procedures including laparotomy or subsequent procedures secondary to intraoperative injury or abnormal pathology; thromboembolic phenomenon; incisional problems and other postoperative or anesthesia complications.  Patient was told that the likelihood that her condition and symptoms will be treated effectively with this surgical management was very high; the postoperative expectations were also discussed in detail. The patient also understands the alternative treatment options which were discussed in full. All questions were answered.  She would like to proceed with TLH/BS/cystoscopy. Will order u/s first and f/u patient re: scheduling after u/s.   She will need continued pap smear surveillance s/p hysterectomy  2. Menorrhagia with regular cycle  RTC PRN  Durene Romans MD Attending Center for [facility])

## 2022-10-03 ENCOUNTER — Encounter: Payer: Self-pay | Admitting: Obstetrics and Gynecology

## 2022-10-03 LAB — CERVICOVAGINAL ANCILLARY ONLY
Bacterial Vaginitis (gardnerella): POSITIVE — AB
Candida Glabrata: NEGATIVE
Candida Vaginitis: NEGATIVE
Chlamydia: NEGATIVE
Comment: NEGATIVE
Comment: NEGATIVE
Comment: NEGATIVE
Comment: NEGATIVE
Comment: NEGATIVE
Comment: NORMAL
Neisseria Gonorrhea: NEGATIVE
Trichomonas: NEGATIVE

## 2022-10-03 LAB — CBC
Hematocrit: 40.6 % (ref 34.0–46.6)
Hemoglobin: 13.4 g/dL (ref 11.1–15.9)
MCH: 29.1 pg (ref 26.6–33.0)
MCHC: 33 g/dL (ref 31.5–35.7)
MCV: 88 fL (ref 79–97)
Platelets: 520 10*3/uL — ABNORMAL HIGH (ref 150–450)
RBC: 4.6 x10E6/uL (ref 3.77–5.28)
RDW: 13.4 % (ref 11.7–15.4)
WBC: 9.2 10*3/uL (ref 3.4–10.8)

## 2022-10-04 ENCOUNTER — Other Ambulatory Visit: Payer: Self-pay | Admitting: *Deleted

## 2022-10-04 MED ORDER — METRONIDAZOLE 500 MG PO TABS: 500.0000 mg | ORAL_TABLET | Freq: Two times a day (BID) | ORAL | 0 refills | Status: DC

## 2022-10-08 ENCOUNTER — Encounter: Payer: Self-pay | Admitting: Obstetrics and Gynecology

## 2022-10-08 DIAGNOSIS — D696 Thrombocytopenia, unspecified: Secondary | ICD-10-CM | POA: Insufficient documentation

## 2022-10-08 MED ORDER — METRONIDAZOLE 500 MG PO TABS: 500.0000 mg | ORAL_TABLET | Freq: Two times a day (BID) | ORAL | 0 refills | Status: DC

## 2022-10-08 NOTE — Addendum Note (Signed)
Addended by: Aletha Halim on: 10/08/2022 08:12 AM   Modules accepted: Orders

## 2022-10-11 ENCOUNTER — Ambulatory Visit
Admission: RE | Admit: 2022-10-11 | Discharge: 2022-10-11 | Disposition: A | Discharge status: Home / Self Care | Payer: BC Managed Care – PPO | Source: Ambulatory Visit | Attending: Obstetrics and Gynecology | Admitting: Obstetrics and Gynecology

## 2022-10-11 DIAGNOSIS — N920 Excessive and frequent menstruation with regular cycle: Secondary | ICD-10-CM | POA: Insufficient documentation

## 2022-10-11 DIAGNOSIS — N946 Dysmenorrhea, unspecified: Secondary | ICD-10-CM | POA: Diagnosis present

## 2022-10-16 ENCOUNTER — Encounter: Payer: Self-pay | Admitting: Obstetrics and Gynecology

## 2022-10-18 ENCOUNTER — Other Ambulatory Visit: Payer: Self-pay | Admitting: Obstetrics and Gynecology

## 2022-10-18 MED ORDER — TRANEXAMIC ACID 650 MG PO TABS: 1300.0000 mg | ORAL_TABLET | Freq: Three times a day (TID) | ORAL | 2 refills | Status: DC

## 2022-10-19 ENCOUNTER — Other Ambulatory Visit: Payer: Self-pay | Admitting: Family Medicine

## 2022-10-31 ENCOUNTER — Encounter: Payer: Self-pay | Admitting: Internal Medicine

## 2022-10-31 ENCOUNTER — Encounter: Payer: Self-pay | Admitting: *Deleted

## 2022-11-20 ENCOUNTER — Encounter: Payer: Self-pay | Admitting: Obstetrics and Gynecology

## 2022-11-21 ENCOUNTER — Encounter: Payer: Self-pay | Admitting: *Deleted

## 2022-11-30 NOTE — Pre-Procedure Instructions (Signed)
Surgical Instructions    Your procedure is scheduled on Tuesday, April 9th.  Report to [facility] Main Entrance "A" at 07:45 A.M., then check in with the Admitting office.  Call this number if you have problems the morning of surgery:  [phone]  If you have any questions prior to your surgery date call [phone]: Open Monday-Friday 8am-4pm If you experience any cold or flu symptoms such as cough, fever, chills, shortness of breath, etc. between now and your scheduled surgery, please notify us at the above number.     Remember:  Do not eat after midnight the night before your surgery  You may drink clear liquids until 06:45 AM the morning of your surgery.   Clear liquids allowed are: Water, Non-Citrus Juices (without pulp), Carbonated Beverages, Clear Tea, Black Coffee Only (NO MILK, CREAM OR POWDERED CREAMER of any kind), and Gatorade.    Take these medicines the morning of surgery with A SIP OF WATER  NIFEdipine (PROCARDIA-XL/NIFEDICAL-XL)  omeprazole (PRILOSEC)    As of today, STOP taking any Aspirin (unless otherwise instructed by your surgeon) Aleve, Naproxen, Ibuprofen, Motrin, Advil, Goody's, BC's, all herbal medications, fish oil, and all vitamins.                     Do NOT Smoke (Tobacco/Vaping) for 24 hours prior to your procedure.  If you use a CPAP at night, you may bring your mask/headgear for your overnight stay.   Contacts, glasses, piercing's, hearing aid's, dentures or partials may not be worn into surgery, please bring cases for these belongings.    For patients admitted to the hospital, discharge time will be determined by your treatment team.   Patients discharged the day of surgery will not be allowed to drive home, and someone needs to stay with them for 24 hours.  SURGICAL WAITING ROOM VISITATION Patients having surgery or a procedure may have no more than 2 support people in the waiting area - these visitors may rotate.   Children under the age of  41 must have an adult with them who is not the patient. If the patient needs to stay at the hospital during part of their recovery, the visitor guidelines for inpatient rooms apply. Pre-op nurse will coordinate an appropriate time for 1 support person to accompany patient in pre-op.  This support person may not rotate.   Please refer to the [facility] website for the visitor guidelines for Inpatients (after your surgery is over and you are in a regular room).    Special instructions:   [facility]- Preparing For Surgery  Before surgery, you can play an important role. Because skin is not sterile, your skin needs to be as free of germs as possible. You can reduce the number of germs on your skin by washing with CHG (chlorahexidine gluconate) Soap before surgery.  CHG is an antiseptic cleaner which kills germs and bonds with the skin to continue killing germs even after washing.    Oral Hygiene is also important to reduce your risk of infection.  Remember - BRUSH YOUR TEETH THE MORNING OF SURGERY WITH YOUR REGULAR TOOTHPASTE  Please do not use if you have an allergy to CHG or antibacterial soaps. If your skin becomes reddened/irritated stop using the CHG.  Do not shave (including legs and underarms) for at least 48 hours prior to first CHG shower. It is OK to shave your face.  Please follow these instructions carefully.   Shower the [employer] BEFORE SURGERY  and the MORNING OF SURGERY  If you chose to wash your hair, wash your hair first as usual with your normal shampoo.  After you shampoo, rinse your hair and body thoroughly to remove the shampoo.  Use CHG Soap as you would any other liquid soap. You can apply CHG directly to the skin and wash gently with a scrungie or a clean washcloth.   Apply the CHG Soap to your body ONLY FROM THE NECK DOWN.  Do not use on open wounds or open sores. Avoid contact with your eyes, ears, mouth and genitals (private parts). Wash Face and genitals (private  parts)  with your normal soap.   Wash thoroughly, paying special attention to the area where your surgery will be performed.  Thoroughly rinse your body with warm water from the neck down.  DO NOT shower/wash with your normal soap after using and rinsing off the CHG Soap.  Pat yourself dry with a CLEAN TOWEL.  Wear CLEAN PAJAMAS to bed the night before surgery  Place CLEAN SHEETS on your bed the night before your surgery  DO NOT SLEEP WITH PETS.   Day of Surgery: Take a shower with CHG soap. Do not wear jewelry or makeup Do not wear lotions, powders, perfumes, or deodorant. Do not shave 48 hours prior to surgery.   Do not bring valuables to the hospital. [facility] is not responsible for any belongings or valuables. Do not wear nail polish, gel polish, artificial nails, or any other type of covering on natural nails (fingers and toes) If you have artificial nails or gel coating that need to be removed by a nail salon, please have this removed prior to surgery. Artificial nails or gel coating may interfere with anesthesia's ability to adequately monitor your vital signs. Wear Clean/Comfortable clothing the morning of surgery Remember to brush your teeth WITH YOUR REGULAR TOOTHPASTE.   Please read over the following fact sheets that you were given.    If you received a COVID test during your pre-op visit  it is requested that you wear a mask when out in public, stay away from anyone that may not be feeling well and notify your surgeon if you develop symptoms. If you have been in contact with anyone that has tested positive in the last 10 days please notify you surgeon.

## 2022-12-03 ENCOUNTER — Other Ambulatory Visit: Payer: Self-pay | Admitting: Obstetrics and Gynecology

## 2022-12-03 ENCOUNTER — Encounter (HOSPITAL_COMMUNITY)
Admission: RE | Admit: 2022-12-03 | Discharge: 2022-12-03 | Disposition: A | Discharge status: Home / Self Care | Payer: BC Managed Care – PPO | Source: Ambulatory Visit | Attending: Obstetrics and Gynecology | Admitting: Obstetrics and Gynecology

## 2022-12-03 ENCOUNTER — Other Ambulatory Visit: Payer: Self-pay

## 2022-12-03 ENCOUNTER — Encounter (HOSPITAL_COMMUNITY): Payer: Self-pay

## 2022-12-03 VITALS — BP 139/95 | HR 100 | Temp 98.2°F | Resp 18 | Ht 65.0 in | Wt 232.3 lb

## 2022-12-03 DIAGNOSIS — Z01818 Encounter for other preprocedural examination: Secondary | ICD-10-CM | POA: Insufficient documentation

## 2022-12-03 DIAGNOSIS — I10 Essential (primary) hypertension: Secondary | ICD-10-CM | POA: Diagnosis not present

## 2022-12-03 DIAGNOSIS — R9431 Abnormal electrocardiogram [ECG] [EKG]: Secondary | ICD-10-CM | POA: Insufficient documentation

## 2022-12-03 LAB — CBC
HCT: 38.7 % (ref 36.0–46.0)
Hemoglobin: 12.5 g/dL (ref 12.0–15.0)
MCH: 28.4 pg (ref 26.0–34.0)
MCHC: 32.3 g/dL (ref 30.0–36.0)
MCV: 88 fL (ref 80.0–100.0)
Platelets: 431 10*3/uL — ABNORMAL HIGH (ref 150–400)
RBC: 4.4 MIL/uL (ref 3.87–5.11)
RDW: 15 % (ref 11.5–15.5)
WBC: 8.8 10*3/uL (ref 4.0–10.5)
nRBC: 0 % (ref 0.0–0.2)

## 2022-12-03 LAB — TYPE AND SCREEN
ABO/RH(D): A POS
Antibody Screen: NEGATIVE

## 2022-12-03 NOTE — Progress Notes (Signed)
PCP - Eber Hong ([place])  Cardiologist - denies  PPM/ICD - denies   Chest x-ray - n/a EKG - 12/03/22 Stress Test - denies ECHO - denies Cardiac Cath - denies  Sleep Study - denies  ERAS Protcol -yes PRE-SURGERY Ensure or G2- not ordered  COVID TEST- not needed   Anesthesia review: no  Patient denies shortness of breath, fever, cough and chest pain at PAT appointment   All instructions explained to the patient, with a verbal understanding of the material. Patient agrees to go over the instructions while at home for a better understanding. Patient also instructed to self quarantine after being tested for COVID-19. The opportunity to ask questions was provided.

## 2022-12-11 ENCOUNTER — Observation Stay (HOSPITAL_COMMUNITY)
Admission: RE | Admit: 2022-12-11 | Discharge: 2022-12-12 | Disposition: A | Discharge status: Home / Self Care | Payer: BC Managed Care – PPO | Attending: Obstetrics and Gynecology | Admitting: Obstetrics and Gynecology

## 2022-12-11 ENCOUNTER — Other Ambulatory Visit: Payer: Self-pay

## 2022-12-11 ENCOUNTER — Encounter (HOSPITAL_COMMUNITY)
Admission: RE | Disposition: A | Discharge status: Home / Self Care | Payer: Self-pay | Source: Home / Self Care | Attending: Obstetrics and Gynecology

## 2022-12-11 ENCOUNTER — Encounter (HOSPITAL_COMMUNITY): Payer: Self-pay | Admitting: Obstetrics and Gynecology

## 2022-12-11 ENCOUNTER — Observation Stay (HOSPITAL_COMMUNITY): Payer: BC Managed Care – PPO | Admitting: Certified Registered"

## 2022-12-11 DIAGNOSIS — N870 Mild cervical dysplasia: Secondary | ICD-10-CM | POA: Diagnosis not present

## 2022-12-11 DIAGNOSIS — Z90710 Acquired absence of both cervix and uterus: Secondary | ICD-10-CM | POA: Diagnosis present

## 2022-12-11 DIAGNOSIS — Z87891 Personal history of nicotine dependence: Secondary | ICD-10-CM | POA: Diagnosis not present

## 2022-12-11 DIAGNOSIS — N920 Excessive and frequent menstruation with regular cycle: Secondary | ICD-10-CM | POA: Diagnosis not present

## 2022-12-11 DIAGNOSIS — I10 Essential (primary) hypertension: Secondary | ICD-10-CM | POA: Insufficient documentation

## 2022-12-11 DIAGNOSIS — Z9889 Other specified postprocedural states: Secondary | ICD-10-CM

## 2022-12-11 DIAGNOSIS — Z79899 Other long term (current) drug therapy: Secondary | ICD-10-CM | POA: Insufficient documentation

## 2022-12-11 DIAGNOSIS — J45909 Unspecified asthma, uncomplicated: Secondary | ICD-10-CM | POA: Diagnosis not present

## 2022-12-11 DIAGNOSIS — Z01818 Encounter for other preprocedural examination: Secondary | ICD-10-CM

## 2022-12-11 HISTORY — PX: CYSTOSCOPY: SHX5120

## 2022-12-11 HISTORY — PX: TOTAL LAPAROSCOPIC HYSTERECTOMY WITH SALPINGECTOMY: SHX6742

## 2022-12-11 LAB — COMPREHENSIVE METABOLIC PANEL
ALT: 20 U/L (ref 0–44)
AST: 19 U/L (ref 15–41)
Albumin: 3.5 g/dL (ref 3.5–5.0)
Alkaline Phosphatase: 73 U/L (ref 38–126)
Anion gap: 8 (ref 5–15)
BUN: 6 mg/dL (ref 6–20)
CO2: 21 mmol/L — ABNORMAL LOW (ref 22–32)
Calcium: 8.8 mg/dL — ABNORMAL LOW (ref 8.9–10.3)
Chloride: 110 mmol/L (ref 98–111)
Creatinine, Ser: 0.7 mg/dL (ref 0.44–1.00)
GFR, Estimated: >60 mL/min (ref >60)
Glucose, Bld: 113 mg/dL — ABNORMAL HIGH (ref 70–99)
Potassium: 3.5 mmol/L (ref 3.5–5.1)
Sodium: 139 mmol/L (ref 135–145)
Total Bilirubin: 0.5 mg/dL (ref 0.3–1.2)
Total Protein: 7 g/dL (ref 6.5–8.1)

## 2022-12-11 LAB — POCT PREGNANCY, URINE: Preg Test, Ur: NEGATIVE

## 2022-12-11 SURGERY — HYSTERECTOMY, TOTAL, LAPAROSCOPIC, WITH SALPINGECTOMY
Anesthesia: General | Site: Abdomen

## 2022-12-11 MED ORDER — SODIUM CHLORIDE 0.9 % IR SOLN
Status: DC | PRN
  Administered 2022-12-11: 2500 mL

## 2022-12-11 MED ORDER — PROPOFOL 10 MG/ML IV BOLUS
INTRAVENOUS | Status: DC | PRN
  Administered 2022-12-11: 180 mg via INTRAVENOUS
  Administered 2022-12-11: 20 mg via INTRAVENOUS

## 2022-12-11 MED ORDER — IBUPROFEN 600 MG PO TABS
600.0000 mg | ORAL_TABLET | Freq: Four times a day (QID) | ORAL | Status: DC
  Administered 2022-12-11 – 2022-12-12 (×2): 600 mg via ORAL
  Filled 2022-12-11 (×2): qty 1

## 2022-12-11 MED ORDER — ONDANSETRON HCL 4 MG/2ML IJ SOLN
4.0000 mg | Freq: Four times a day (QID) | INTRAMUSCULAR | Status: DC | PRN
  Administered 2022-12-11: 4 mg via INTRAVENOUS
  Filled 2022-12-11: qty 2

## 2022-12-11 MED ORDER — HEMOSTATIC AGENTS (NO CHARGE) OPTIME
TOPICAL | Status: DC | PRN
  Administered 2022-12-11: 1 via TOPICAL

## 2022-12-11 MED ORDER — HYDRALAZINE HCL 20 MG/ML IJ SOLN
INTRAMUSCULAR | Status: AC
  Filled 2022-12-11: qty 1

## 2022-12-11 MED ORDER — ONDANSETRON HCL 4 MG/2ML IJ SOLN
INTRAMUSCULAR | Status: DC | PRN
  Administered 2022-12-11: 4 mg via INTRAVENOUS

## 2022-12-11 MED ORDER — ROCURONIUM BROMIDE 10 MG/ML (PF) SYRINGE
PREFILLED_SYRINGE | INTRAVENOUS | Status: AC
  Filled 2022-12-11: qty 10

## 2022-12-11 MED ORDER — FLUORESCEIN SODIUM 10 % IV SOLN
INTRAVENOUS | Status: AC
  Filled 2022-12-11: qty 5

## 2022-12-11 MED ORDER — 0.9 % SODIUM CHLORIDE (POUR BTL) OPTIME
TOPICAL | Status: DC | PRN
  Administered 2022-12-11: 500 mL

## 2022-12-11 MED ORDER — FLUORESCEIN SODIUM 10 % IV SOLN
INTRAVENOUS | Status: DC | PRN
  Administered 2022-12-11: .25 mg via INTRAVENOUS

## 2022-12-11 MED ORDER — CEFAZOLIN SODIUM 1 G IJ SOLR
INTRAMUSCULAR | Status: AC
  Filled 2022-12-11: qty 10

## 2022-12-11 MED ORDER — FENTANYL CITRATE (PF) 250 MCG/5ML IJ SOLN
INTRAMUSCULAR | Status: DC | PRN
  Administered 2022-12-11 (×2): 100 ug via INTRAVENOUS
  Administered 2022-12-11: 50 ug via INTRAVENOUS

## 2022-12-11 MED ORDER — KETOROLAC TROMETHAMINE 30 MG/ML IJ SOLN
INTRAMUSCULAR | Status: AC
  Filled 2022-12-11: qty 1

## 2022-12-11 MED ORDER — ONDANSETRON HCL 4 MG/2ML IJ SOLN
INTRAMUSCULAR | Status: AC
  Filled 2022-12-11: qty 2

## 2022-12-11 MED ORDER — ALBUTEROL SULFATE HFA 108 (90 BASE) MCG/ACT IN AERS
INHALATION_SPRAY | RESPIRATORY_TRACT | Status: AC
  Filled 2022-12-11: qty 6.7

## 2022-12-11 MED ORDER — LIDOCAINE 2% (20 MG/ML) 5 ML SYRINGE
INTRAMUSCULAR | Status: DC | PRN
  Administered 2022-12-11: 40 mg via INTRAVENOUS

## 2022-12-11 MED ORDER — BUPIVACAINE HCL 0.5 % IJ SOLN
INTRAMUSCULAR | Status: DC | PRN
  Administered 2022-12-11: 17 mL

## 2022-12-11 MED ORDER — HYDROMORPHONE HCL 1 MG/ML IJ SOLN
0.5000 mg | INTRAMUSCULAR | Status: AC | PRN
  Administered 2022-12-11 (×2): 0.5 mg via INTRAVENOUS
  Filled 2022-12-11 (×2): qty 0.5

## 2022-12-11 MED ORDER — ROCURONIUM BROMIDE 10 MG/ML (PF) SYRINGE
PREFILLED_SYRINGE | INTRAVENOUS | Status: DC | PRN
  Administered 2022-12-11 (×2): 20 mg via INTRAVENOUS
  Administered 2022-12-11: 50 mg via INTRAVENOUS
  Administered 2022-12-11: 20 mg via INTRAVENOUS
  Administered 2022-12-11: 30 mg via INTRAVENOUS
  Administered 2022-12-11: 20 mg via INTRAVENOUS

## 2022-12-11 MED ORDER — LABETALOL HCL 5 MG/ML IV SOLN
INTRAVENOUS | Status: AC
  Filled 2022-12-11: qty 4

## 2022-12-11 MED ORDER — DEXMEDETOMIDINE HCL IN NACL 80 MCG/20ML IV SOLN
INTRAVENOUS | Status: AC
  Filled 2022-12-11: qty 20

## 2022-12-11 MED ORDER — PROPOFOL 10 MG/ML IV BOLUS
INTRAVENOUS | Status: AC
  Filled 2022-12-11: qty 20

## 2022-12-11 MED ORDER — SODIUM CHLORIDE 0.9 % IV SOLN: INTRAVENOUS | Status: DC

## 2022-12-11 MED ORDER — DEXMEDETOMIDINE HCL IN NACL 80 MCG/20ML IV SOLN
INTRAVENOUS | Status: DC | PRN
  Administered 2022-12-11: 12 ug via BUCCAL

## 2022-12-11 MED ORDER — KETOROLAC TROMETHAMINE 30 MG/ML IJ SOLN
INTRAMUSCULAR | Status: DC | PRN
  Administered 2022-12-11: 30 mg via INTRAVENOUS

## 2022-12-11 MED ORDER — LACTATED RINGERS IV SOLN: INTRAVENOUS | Status: DC

## 2022-12-11 MED ORDER — BUPIVACAINE HCL (PF) 0.5 % IJ SOLN
INTRAMUSCULAR | Status: AC
  Filled 2022-12-11: qty 30

## 2022-12-11 MED ORDER — CHLORHEXIDINE GLUCONATE 0.12 % MT SOLN
15.0000 mL | Freq: Once | OROMUCOSAL | Status: AC
  Administered 2022-12-11: 15 mL via OROMUCOSAL
  Filled 2022-12-11: qty 15

## 2022-12-11 MED ORDER — DEXAMETHASONE SODIUM PHOSPHATE 10 MG/ML IJ SOLN
INTRAMUSCULAR | Status: AC
  Filled 2022-12-11: qty 1

## 2022-12-11 MED ORDER — LABETALOL HCL 5 MG/ML IV SOLN
INTRAVENOUS | Status: DC | PRN
  Administered 2022-12-11 (×3): 5 mg via INTRAVENOUS

## 2022-12-11 MED ORDER — FENTANYL CITRATE (PF) 100 MCG/2ML IJ SOLN: 25.0000 ug | INTRAMUSCULAR | Status: DC | PRN

## 2022-12-11 MED ORDER — EPHEDRINE 5 MG/ML INJ
INTRAVENOUS | Status: AC
  Filled 2022-12-11: qty 5

## 2022-12-11 MED ORDER — ORAL CARE MOUTH RINSE: 15.0000 mL | Freq: Once | OROMUCOSAL | Status: AC

## 2022-12-11 MED ORDER — DEXAMETHASONE SODIUM PHOSPHATE 10 MG/ML IJ SOLN
INTRAMUSCULAR | Status: DC | PRN
  Administered 2022-12-11: 10 mg via INTRAVENOUS

## 2022-12-11 MED ORDER — PHENYLEPHRINE 80 MCG/ML (10ML) SYRINGE FOR IV PUSH (FOR BLOOD PRESSURE SUPPORT)
PREFILLED_SYRINGE | INTRAVENOUS | Status: AC
  Filled 2022-12-11: qty 10

## 2022-12-11 MED ORDER — OXYCODONE HCL 5 MG/5ML PO SOLN: 5.0000 mg | Freq: Once | ORAL | Status: DC | PRN

## 2022-12-11 MED ORDER — MIDAZOLAM HCL 2 MG/2ML IJ SOLN
INTRAMUSCULAR | Status: AC
  Filled 2022-12-11: qty 2

## 2022-12-11 MED ORDER — ONDANSETRON HCL 4 MG PO TABS: 4.0000 mg | ORAL_TABLET | Freq: Four times a day (QID) | ORAL | Status: DC | PRN

## 2022-12-11 MED ORDER — FENTANYL CITRATE (PF) 250 MCG/5ML IJ SOLN
INTRAMUSCULAR | Status: AC
  Filled 2022-12-11: qty 5

## 2022-12-11 MED ORDER — SODIUM CHLORIDE 0.9 % IV SOLN: INTRAVENOUS | Status: AC

## 2022-12-11 MED ORDER — SUGAMMADEX SODIUM 200 MG/2ML IV SOLN
INTRAVENOUS | Status: DC | PRN
  Administered 2022-12-11: 200 mg via INTRAVENOUS
  Administered 2022-12-11: 100 mg via INTRAVENOUS

## 2022-12-11 MED ORDER — POLYETHYLENE GLYCOL 3350 17 G PO PACK: 17.0000 g | PACK | Freq: Every day | ORAL | Status: DC

## 2022-12-11 MED ORDER — POVIDONE-IODINE 10 % EX SWAB
2.0000 | Freq: Once | CUTANEOUS | Status: AC
  Administered 2022-12-11: 2 via TOPICAL

## 2022-12-11 MED ORDER — LIDOCAINE 2% (20 MG/ML) 5 ML SYRINGE
INTRAMUSCULAR | Status: AC
  Filled 2022-12-11: qty 5

## 2022-12-11 MED ORDER — HYDROMORPHONE HCL 1 MG/ML IJ SOLN
INTRAMUSCULAR | Status: DC | PRN
  Administered 2022-12-11: .5 mg via INTRAVENOUS

## 2022-12-11 MED ORDER — FLUORESCEIN SODIUM 10 % IV SOLN
500.0000 mg | Freq: Once | INTRAVENOUS | Status: DC
  Filled 2022-12-11: qty 5

## 2022-12-11 MED ORDER — HYDROMORPHONE HCL 1 MG/ML IJ SOLN
INTRAMUSCULAR | Status: AC
  Filled 2022-12-11: qty 0.5

## 2022-12-11 MED ORDER — OXYCODONE HCL 5 MG PO TABS
5.0000 mg | ORAL_TABLET | ORAL | Status: DC | PRN
  Administered 2022-12-12: 5 mg via ORAL
  Filled 2022-12-11: qty 1

## 2022-12-11 MED ORDER — PANTOPRAZOLE SODIUM 40 MG PO TBEC
40.0000 mg | DELAYED_RELEASE_TABLET | Freq: Every day | ORAL | Status: DC
  Filled 2022-12-11: qty 1

## 2022-12-11 MED ORDER — GABAPENTIN 100 MG PO CAPS
200.0000 mg | ORAL_CAPSULE | Freq: Two times a day (BID) | ORAL | Status: DC
  Administered 2022-12-11 – 2022-12-12 (×2): 200 mg via ORAL
  Filled 2022-12-11 (×2): qty 2

## 2022-12-11 MED ORDER — NIFEDIPINE ER OSMOTIC RELEASE 30 MG PO TB24
30.0000 mg | ORAL_TABLET | Freq: Every day | ORAL | Status: DC
  Administered 2022-12-12: 30 mg via ORAL
  Filled 2022-12-11: qty 1

## 2022-12-11 MED ORDER — CEFAZOLIN SODIUM-DEXTROSE 2-4 GM/100ML-% IV SOLN
2.0000 g | INTRAVENOUS | Status: AC
  Administered 2022-12-11 (×2): 2 g via INTRAVENOUS
  Filled 2022-12-11: qty 100

## 2022-12-11 MED ORDER — MIDAZOLAM HCL 2 MG/2ML IJ SOLN
INTRAMUSCULAR | Status: DC | PRN
  Administered 2022-12-11: 2 mg via INTRAVENOUS

## 2022-12-11 MED ORDER — SIMETHICONE 80 MG PO CHEW
80.0000 mg | CHEWABLE_TABLET | Freq: Three times a day (TID) | ORAL | Status: DC
  Administered 2022-12-11: 80 mg via ORAL
  Filled 2022-12-11: qty 1

## 2022-12-11 MED ORDER — OXYCODONE HCL 5 MG PO TABS: 5.0000 mg | ORAL_TABLET | Freq: Once | ORAL | Status: DC | PRN

## 2022-12-11 MED ORDER — ONDANSETRON HCL 4 MG/2ML IJ SOLN: 4.0000 mg | Freq: Four times a day (QID) | INTRAMUSCULAR | Status: DC | PRN

## 2022-12-11 SURGICAL SUPPLY — 62 items
ADH SKN CLS APL DERMABOND .7 (GAUZE/BANDAGES/DRESSINGS) ×2
APL SRG 38 LTWT LNG FL B (MISCELLANEOUS) ×2
APPLICATOR ARISTA FLEXITIP XL (MISCELLANEOUS) IMPLANT
BARRIER ADHS 3X4 INTERCEED (GAUZE/BANDAGES/DRESSINGS) IMPLANT
BLADE SURG 15 STRL LF DISP TIS (BLADE) ×2 IMPLANT
BLADE SURG 15 STRL SS (BLADE) ×2
BRR ADH 4X3 ABS CNTRL BYND (GAUZE/BANDAGES/DRESSINGS)
DEFOGGER SCOPE WARMER CLEARIFY (MISCELLANEOUS) ×2 IMPLANT
DERMABOND ADVANCED .7 DNX12 (GAUZE/BANDAGES/DRESSINGS) ×2 IMPLANT
DEVICE SUTURE ENDOST 10MM (ENDOMECHANICALS) IMPLANT
DURAPREP 26ML APPLICATOR (WOUND CARE) ×2 IMPLANT
GLOVE BIOGEL PI IND STRL 6.5 (GLOVE) IMPLANT
GLOVE BIOGEL PI IND STRL 7.0 (GLOVE) IMPLANT
GLOVE BIOGEL PI IND STRL 7.5 (GLOVE) ×4 IMPLANT
GLOVE ECLIPSE 7.0 STRL STRAW (GLOVE) IMPLANT
GLOVE INDICATOR 7.5 STRL GRN (GLOVE) IMPLANT
GLOVE SURG SS PI 6.5 STRL IVOR (GLOVE) IMPLANT
GLOVE SURG SS PI 7.0 STRL IVOR (GLOVE) ×4 IMPLANT
GOWN STRL REUS W/ TWL LRG LVL3 (GOWN DISPOSABLE) ×4 IMPLANT
GOWN STRL REUS W/ TWL XL LVL3 (GOWN DISPOSABLE) ×2 IMPLANT
GOWN STRL REUS W/TWL LRG LVL3 (GOWN DISPOSABLE) ×6
GOWN STRL REUS W/TWL XL LVL3 (GOWN DISPOSABLE) ×4
HEMOSTAT ARISTA ABSORB 3G PWDR (HEMOSTASIS) IMPLANT
HIBICLENS CHG 4% 4OZ BTL (MISCELLANEOUS) ×2 IMPLANT
IRRIG SUCT STRYKERFLOW 2 WTIP (MISCELLANEOUS) ×2
IRRIGATION SUCT STRKRFLW 2 WTP (MISCELLANEOUS) ×2 IMPLANT
KIT PINK PAD W/HEAD ARE REST (MISCELLANEOUS) ×2
KIT PINK PAD W/HEAD ARM REST (MISCELLANEOUS) ×4 IMPLANT
KIT TURNOVER KIT B (KITS) ×2 IMPLANT
L-HOOK LAP DISP 36CM (ELECTROSURGICAL) ×2
LHOOK LAP DISP 36CM (ELECTROSURGICAL) IMPLANT
LIGASURE VESSEL 5MM BLUNT TIP (ELECTROSURGICAL) IMPLANT
MANIFOLD NEPTUNE II (INSTRUMENTS) ×2 IMPLANT
MANIPULATOR VCARE LG CRV RETR (MISCELLANEOUS) IMPLANT
MANIPULATOR VCARE SML CRV RETR (MISCELLANEOUS) IMPLANT
MANIPULATOR VCARE STD CRV RETR (MISCELLANEOUS) IMPLANT
OCCLUDER COLPOPNEUMO (BALLOONS) ×2 IMPLANT
PACK LAPAROSCOPY BASIN (CUSTOM PROCEDURE TRAY) ×2 IMPLANT
PAD ARMBOARD 7.5X6 YLW CONV (MISCELLANEOUS) ×4 IMPLANT
PAD OB MATERNITY 4.3X12.25 (PERSONAL CARE ITEMS) ×2 IMPLANT
PENCIL SMOKE EVACUATOR (MISCELLANEOUS) ×2 IMPLANT
POUCH LAPAROSCOPIC INSTRUMENT (MISCELLANEOUS) ×2 IMPLANT
SCISSORS LAP 5X35 DISP (ENDOMECHANICALS) IMPLANT
SET IRRIG Y TYPE TUR BLADDER L (SET/KITS/TRAYS/PACK) ×2 IMPLANT
SET TUBE SMOKE EVAC HIGH FLOW (TUBING) ×2 IMPLANT
SLEEVE SURGEON STRL (DRAPES) IMPLANT
SOL ELECTROSURG ANTI STICK (MISCELLANEOUS)
SOLUTION ELECTROSURG ANTI STCK (MISCELLANEOUS) IMPLANT
SUT ENDO VLOC 180-0-8IN (SUTURE) IMPLANT
SUT MNCRL AB 4-0 PS2 18 (SUTURE) IMPLANT
SUT VICRYL 0 UR6 27IN ABS (SUTURE) IMPLANT
SYR 10ML LL (SYRINGE) ×2 IMPLANT
SYR 50ML LL SCALE MARK (SYRINGE) ×2 IMPLANT
SYSTEM CARTER THOMASON II (TROCAR) IMPLANT
TOWEL GREEN STERILE FF (TOWEL DISPOSABLE) ×2 IMPLANT
TRAY FOLEY W/BAG SLVR 14FR (SET/KITS/TRAYS/PACK) IMPLANT
TRAY FOLEY W/BAG SLVR 16FR (SET/KITS/TRAYS/PACK)
TRAY FOLEY W/BAG SLVR 16FR ST (SET/KITS/TRAYS/PACK) ×2 IMPLANT
TROCAR 11X100 Z THREAD (TROCAR) IMPLANT
TROCAR ADV FIXATION 5X100MM (TROCAR) IMPLANT
TROCAR BALLN 12MMX100 BLUNT (TROCAR) IMPLANT
UNDERPAD 30X36 HEAVY ABSORB (UNDERPADS AND DIAPERS) ×2 IMPLANT

## 2022-12-11 NOTE — Anesthesia Preprocedure Evaluation (Signed)
Anesthesia Evaluation  Patient identified by MRN, date of birth, ID band Patient awake    Reviewed: Allergy & Precautions, H&P , NPO status , Patient's Chart, lab work & pertinent test results  Airway Mallampati: II   Neck ROM: full    Dental   Pulmonary asthma , former smoker   breath sounds clear to auscultation       Cardiovascular hypertension,  Rhythm:regular Rate:Normal     Neuro/Psych  PSYCHIATRIC DISORDERS  Depression       GI/Hepatic ,GERD  ,,  Endo/Other    Renal/GU      Musculoskeletal  (+) Arthritis ,    Abdominal   Peds  Hematology   Anesthesia Other Findings   Reproductive/Obstetrics                             Anesthesia Physical Anesthesia Plan  ASA: 2  Anesthesia Plan: General   Post-op Pain Management:    Induction: Intravenous  PONV Risk Score and Plan: 3 and Ondansetron, Dexamethasone, Midazolam and Treatment may vary due to age or medical condition  Airway Management Planned: Oral ETT  Additional Equipment:   Intra-op Plan:   Post-operative Plan: Extubation in OR  Informed Consent: I have reviewed the patients History and Physical, chart, labs and discussed the procedure including the risks, benefits and alternatives for the proposed anesthesia with the patient or authorized representative who has indicated his/her understanding and acceptance.     Dental advisory given  Plan Discussed with: CRNA, Anesthesiologist and Surgeon  Anesthesia Plan Comments:        Anesthesia Quick Evaluation

## 2022-12-11 NOTE — Anesthesia Procedure Notes (Signed)
Procedure Name: Intubation Date/Time: 12/11/2022 10:09 AM  Performed by: Alwyn Ren, CRNAPre-anesthesia Checklist: Patient identified, Emergency Drugs available, Suction available and Patient being monitored Patient Re-evaluated:Patient Re-evaluated prior to induction Oxygen Delivery Method: Circle system utilized Preoxygenation: Pre-oxygenation with 100% oxygen Induction Type: IV induction Ventilation: Mask ventilation without difficulty Laryngoscope Size: Miller and 2 Grade View: Grade I Tube type: Oral Tube size: 7.0 mm Number of attempts: 1 Airway Equipment and Method: Stylet and Oral airway Placement Confirmation: ETT inserted through vocal cords under direct vision, positive ETCO2 and breath sounds checked- equal and bilateral Secured at: 22 cm Tube secured with: Tape Dental Injury: Teeth and Oropharynx as per pre-operative assessment

## 2022-12-11 NOTE — Op Note (Signed)
Operative Note   12/11/2022  PRE-OP DIAGNOSIS *Menorrhagia  *Dysmenorrhea   POST-OP DIAGNOSIS *Same *Pelvic adhesive disease   SURGEON: Surgeon(s) and Role:    * Brinson BingPickens, Ximena Todaro, MD - Primary  ASSISTANT:    Hermina Staggers* Ervin, Michael L, MD - Assisting  An experienced assistant was required given the standard of surgical care given the complexity of the case.  This assistant was needed for exposure, dissection, suctioning, retraction, instrument exchange, and for overall help during the procedure.  PROCEDURE: Diagnostic laparoscopy, lysis of adhesions approximately 90 minutes, total laparoscopic hysterectomy, bilateral salpingectomy, cystoscopy  ANESTHESIA: General and local  ESTIMATED BLOOD LOSS:  250mL  DRAINS: indwelling foley 200ml UOP   TOTAL IV FLUIDS: 1200mL crystalloid  VTE PROPHYLAXIS: SCDs to the bilateral lower extremities  ANTIBIOTICS: Two grams of Cefazolin were given. within 1 hour of skin incision  SPECIMENS: cervix, uterus, bilateral tubes  DISPOSITION: PACU - hemodynamically stable.  CONDITION: stable  COMPLICATIONS: Patient OR bed not assembled correctly, which was not noticed until after patient was asleep and positioning attempted. Patient had to be moved back to transport bed, OR bed assembled correctly and then patient moved back to OR bed, which added approximately 20 minutes to the case.   FINDINGS: Normal liver and stomach edge. Omental adhesion at the suprapubic area below the area of the low transverse skin incision and moderately dense adhesions at the bladder flap from the middle to right side. Bowel adhesions to the left pelvic brim.   Grossly normal cervix, uterus, fallopian tubes and ovaries and normal EGBUS, vaginal vault and cervix on pelvic exam.   Normal bladder and +efflux of green dye from bilateral ureteral orifices on cystoscopy.   DESCRIPTION OF PROCEDURE: After informed consent was obtained, the patient was taken to the operating room where  anesthesia was obtained without difficulty. The patient was positioned in the dorsal lithotomy position in [place] stirrups and her arms were carefully tucked at her sides and the usual precautions were taken.  She was prepped and draped in normal sterile fashion.  Time-out was performed and a Foley catheter was placed into the bladder. A standard VCare uterine manipulator was then placed in the uterus without incident. Gloves were then changed, and after injection of local anesthesia, the open technique was used to place an infraumbilical 12-mm baloon trocar under direct visualization. The laparoscope was introduced and CO2 gas was infused for pneumoperitoneum to a pressure of 15 mm Hg and the area below inspected for injury.  The patient was placed in Trendelenburg and the bowel was displaced up into the upper abdomen, and the right and left lateral 5-mm ports.   Omental adhesion lysed with Ligasure and 11mm suprapubic port placed under direct visualization and after injection of local anesthesia.  The left round ligament was cauterized and cut and the bladder flap developed until the scar tissue was encountered on the right side of the bladder flap area. Bowel also noticed to not be mobile with adhesion of large bowel noted on the left pelvic brim which was lysed with monopolar scissors.  The left utero-ovarian ligament was then cauterized, cut and opened.  The right round ligament was cauterized and cut and opened some. The right utero-ovarian ligament was cauterized and cut and this allowed the broad ligament to open up.  The adhesion at the bladder flap area was taken down superiorly with monopolar scissors.  The bladder was then distended with insufflation area and the bladder was away from the adhesion band.  The adhesion was continued to be opened up until the bladder flap was down well past the colpotomy cup.  The broad ligaments were opened further, and the uterine arteries were skeletonized bilaterally,  sealed and divided with the Ligasure device.  A colpotomy was performed circumferentially along the ring with electrocautery and the cervix was incised from the vagina and the specimen was removed through the vagina.  A pneumo balloon was placed in the vagina and the vaginal cuff was then closed in a running continuous fashion using the EndoStitch technique with 2-0 V-Lock suture with careful attention to include the vaginal cuff angles and the vaginal mucosa within the closure.  The bilateral tubes were then cauterized, transected and separated from the ovaries, with the Ligasure device and removed; the cystoscopy was then performed, with the above noted findings. The intraperitoneal pressure was dropped, and all planes of dissection, vascular pedicles and the vaginal cuff were found to be hemostatic.    The suprapubic trocar and the umbilical was removed and the fascia was closed with 0 vicryl suture using the [employer] thomason II device. The lateral trocars were removed under visualization.   Before the umbilical trocar was removed the CO2 gas was released.   The skin incision at the umbilicus and suprapubic site was closed with a subcuticular stitch of 4-0 monocryl.  The remaining skin incisions were closed with Dermabond glue.  The patient tolerated the procedure well.  Sponge, lap and needle counts were correct x2.  The patient was taken to recovery room in excellent condition.  Cornelia Copaharlie Johnella Crumm, Jr MD Attending Center for [facility](Faculty Practice)

## 2022-12-11 NOTE — H&P (Signed)
Obstetrics & Gynecology Surgical H&P   Date of Surgery: 12/11/2022    Primary OBGYN: [facility]  Reason for Admission: scheduled hysterectomy  History of Present Illness: Brandi [name] is a 37 y.o. [phone] with the above CC.  Her past medical history is significant for HTN, BMI 38, h/o c-section x 1, LSIL pap   Patient with heavy and painful periods that last for 10 days. She was to get a BTL with Dr. Shawnie Pons but cancelled due to concern that BTL could worsen her periods. She has used nexplanon, mirena and depo and the latter did help but concerned on how it made her mood. She states her periods were bad prior to this most recent pregnancy and worse since the c-section. Patient seen by me on 1/30 and medical and surgical options d/w her. She elected for hysterectomy, pelvic u/s and trial of Lysteda prior to surgery. She last had a period in mid to late March and believes the lysteda may have helped with her heavy and painful periods. Pelvic u/s showed normal 9cm uterus.   ROS: A 12-point review of systems was performed and negative, except as stated in the above HPI.  OBGYN History: As per HPI. OB History  Gravida Para Term Preterm AB Living  2 2 1 1   1   SAB IAB Ectopic Multiple Live Births          1    # Outcome Date GA Lbr Len/2nd Weight Sex Delivery Anes PTL Lv  2 Preterm 07/12/22 [redacted]w[redacted]d  1928 g M CS-LTranv        Complications: Placenta Previa  1 Term 2009   3765 g M Vag-Spont   LIV    Past Medical History: Past Medical History:  Diagnosis Date   Abnormal Pap smear of cervix 12/2015   ascus/pos- achd   Allergies    Arthritis    Asthma    Chlamydial cervicitis    Depression    pt has past treatment w/ Zoloft - good symptom control, but worsening IBS symptoms   Genital HSV    GERD (gastroesophageal reflux disease)    Heart murmur    Hypertension    IBS (irritable bowel syndrome)    Prediabetes 01/19/2022   [ ]  early GTT   Pyelonephritis     Thrombocytosis 07/20/2021   Trichomoniasis     Past Surgical History: Past Surgical History:  Procedure Laterality Date   CESAREAN SECTION     COLONOSCOPY WITH PROPOFOL N/A 12/29/2020   Procedure: COLONOSCOPY WITH BIOPSY;  Surgeon: Toney Reil, MD;  Location: [facility];  Service: Endoscopy;  Laterality: N/A;   COLPOSCOPY  04/17/2022   ESOPHAGOGASTRODUODENOSCOPY (EGD) WITH PROPOFOL N/A 12/29/2020   Procedure: ESOPHAGOGASTRODUODENOSCOPY (EGD) WITH BIOPSY;  Surgeon: Toney Reil, MD;  Location: [facility];  Service: Endoscopy;  Laterality: N/A;   POLYPECTOMY N/A 12/29/2020   Procedure: POLYPECTOMY;  Surgeon: Toney Reil, MD;  Location: [facility];  Service: Endoscopy;  Laterality: N/A;   WISDOM TOOTH EXTRACTION      Family History:  Family History  Problem Relation Age of Onset   Diabetes Mother    Hypertension Mother    Asthma Father    Heart disease Father    Heart attack Father    Asthma Sister    Hypertension Sister    ADD / ADHD Son    Cancer Maternal Aunt    Cancer Maternal Uncle    Cancer Maternal Grandmother  Lung cancer Maternal Grandmother    Heart disease Maternal Grandfather    Lung cancer Paternal Grandmother    Heart disease Paternal Grandfather    Birth defects Neg Hx    Stroke Neg Hx     Social History:  Social History   Socioeconomic History   Marital status: Single    Spouse name: Not on file   Number of children: 1   Years of education: Not on file   Highest education level: Some college, no degree  Occupational History   Occupation: Horticulturist, commercialDebt Collection    Comment: [employer]  Tobacco Use   Smoking status: Former    Packs/day: 1.00    Years: 12.00    Additional pack years: 0.00    Total pack years: 12.00    Types: Cigarettes    Quit date: 12/07/2021    Years since quitting: 1.0    Passive exposure: Past   Smokeless tobacco: Never  Vaping Use   Vaping Use: Former   Substances: Nicotine,  Flavoring  Substance and Sexual Activity   Alcohol use: Not Currently    Alcohol/week: 1.0 standard drink of alcohol    Types: 1 Glasses of wine per week    Comment: rare   Drug use: Not Currently    Frequency: 7.0 times per week    Types: Marijuana   Sexual activity: Not Currently  Other Topics Concern   Not on file  Social History Narrative   Not on file   Social Determinants of Health   Financial Resource Strain: Low Risk  (10/22/2018)   Overall Financial Resource Strain (CARDIA)    Difficulty of Paying Living Expenses: Not very hard  Food Insecurity: No Food Insecurity (07/07/2022)   Hunger Vital Sign    Worried About Running Out of Food in the Last Year: Never true    Ran Out of Food in the Last Year: Never true  Transportation Needs: No Transportation Needs (07/07/2022)   PRAPARE - Administrator, Civil ServiceTransportation    Lack of Transportation (Medical): No    Lack of Transportation (Non-Medical): No  Physical Activity: Not on file  Stress: Not on file  Social Connections: Not on file  Intimate Partner Violence: Not At Risk (07/07/2022)   Humiliation, Afraid, Rape, and Kick questionnaire    Fear of Current or Ex-Partner: No    Emotionally Abused: No    Physically Abused: No    Sexually Abused: No    Allergy: Allergies  Allergen Reactions   Viberzi [Eluxadoline] Itching   Amoxicillin Nausea And Vomiting and Rash    Current Outpatient Medications: Medications Prior to Admission  Medication Sig Dispense Refill Last Dose   NIFEdipine (PROCARDIA-XL/NIFEDICAL-XL) 30 MG 24 hr tablet Take 30 mg by mouth daily.   12/11/2022   omeprazole (PRILOSEC) 20 MG capsule Take 1 capsule (20 mg total) by mouth daily. 90 capsule 1 12/11/2022   tranexamic acid (LYSTEDA) 650 MG TABS tablet Take 2 tablets (1,300 mg total) by mouth 3 (three) times daily. Take during menses for a maximum of five days, during your period (Patient not taking: Reported on 11/28/2022) 30 tablet 2 Not Taking   metroNIDAZOLE (FLAGYL) 500  MG tablet Take 1 tablet (500 mg total) by mouth 2 (two) times daily. (Patient not taking: Reported on 11/28/2022) 14 tablet 0 Not Taking     Hospital Medications: Current Facility-Administered Medications  Medication Dose Route Frequency Provider Last Rate Last Admin   0.9 %  sodium chloride infusion   Intravenous Continuous Geyser BingPickens, Ruffus Kamaka, MD  ceFAZolin (ANCEF) IVPB 2g/100 mL premix  2 g Intravenous On Call to OR [place] Bing, MD       lactated ringers infusion   Intravenous Continuous Heather Roberts, MD 10 mL/hr at 12/11/22 0828 New Bag at 12/11/22 2620     Physical Exam:  Current Vital Signs 24h Vital Sign Ranges  T 98.2 F (36.8 C) Temp  Avg: 98.2 F (36.8 C)  Min: 98.2 F (36.8 C)  Max: 98.2 F (36.8 C)  BP (!) 141/93 BP  Min: 141/93  Max: 141/93  HR 86 Pulse  Avg: 86  Min: 86  Max: 86  RR 17 Resp  Avg: 17  Min: 17  Max: 17  SaO2 95 % Room Air SpO2  Avg: 95 %  Min: 95 %  Max: 95 %       24 Hour I/O Current Shift I/O  Time Ins Outs No intake/output data recorded. No intake/output data recorded.    Body mass index is 39.11 kg/m. General appearance: Well nourished, well developed female in no acute distress.  Cardiovascular: S1, S2 normal, no murmur, rub or gallop, regular rate and rhythm Respiratory:  Clear to auscultation bilateral. Normal respiratory effort Abdomen: positive bowel sounds and no masses, hernias; diffusely non tender to palpation, non distended Neuro/Psych:  Normal mood and affect.  Skin:  Warm and dry.  Extremities: no clubbing, cyanosis, or edema.  Lymphatic:  No inguinal lymphadenopathy.   From 10/02/2022 Pelvic exam: is limited by body habitus EGBUS: within normal limits Vagina: within normal limits and with no blood or discharge in the vault Cervix: normal appearing cervix without tenderness, discharge or lesions.  Uterus:  nonenlarged and non tender, mobile Adnexa:  normal adnexa and no mass, fullness, tenderness Rectovaginal:  deferred  Laboratory: UPT: negative CMP: pending    Latest Ref Rng & Units 12/03/2022   11:29 AM 10/02/2022   12:01 PM 08/03/2022    3:25 PM  CBC  WBC 4.0 - 10.5 K/uL 8.8  9.2  8.6   Hemoglobin 12.0 - 15.0 g/dL 35.5  97.4  16.3   Hematocrit 36.0 - 46.0 % 38.7  40.6  38.5   Platelets 150 - 400 K/uL 431  520  465     Imaging:  Narrative & Impression  CLINICAL DATA:  heavy, painful periods   EXAM: ULTRASOUND OF PELVIS   TECHNIQUE: Transabdominal and transvaginalultrasound examination of the pelvis was performed including evaluation of the uterus, ovaries, adnexal regions, and pelvic cul-de-sac.   COMPARISON:  None Available.   FINDINGS: Uterusanteverted, 9 x 6 x 4 cm. The endometrium unremarkable 1.2 cm. The uterine cavity is empty. There are no uterine masses. C-section scar noted.   Right ovary   Unremarkable, 3.6 x 3.7 x 3.6 cm.   Left ovary   Unremarkable, 2.6 x 2.5 x 2.2 cm.   Images of the adnexae demonstrated no masses or fluid collections.   IMPRESSION: Unremarkable examination of the pelvis.     Electronically Signed   By: Layla Maw M.D.   On: 10/11/2022 12:25    Assessment: Brandi [name] is a 37 y.o. G2P1101 here for scheduled hysterectomy; patient stable  Plan: Anesthesia reviewed her ECG and fine with proceeding with surgery today. Patient would like to proceed with hysterectomy/definitive treatment today and plan is for TLH/BS/cysto and plan to keep overnight. Surgical risks again reviewed with patient.   Can proceed to OR when room is ready.    Cornelia Copa MD Attending Center for [facility]  Healthcare Fish farm manager)

## 2022-12-11 NOTE — OR Nursing (Signed)
Patient has a total of four port sites, the forth one was added at approximately 12:45pm, and it is located at the pubic fold -lower-midline abdomen.

## 2022-12-11 NOTE — Plan of Care (Signed)
  Problem: Skin Integrity: ?Goal: Demonstration of wound healing without infection will improve ?Outcome: Progressing ?  ?Problem: Education: ?Goal: Knowledge of General Education information will improve ?Description: Including pain rating scale, medication(s)/side effects and non-pharmacologic comfort measures ?Outcome: Progressing ?  ?

## 2022-12-11 NOTE — Brief Op Note (Signed)
12/11/2022  2:14 PM  PATIENT:  Brandi [name]  37 y.o. female  PRE-OPERATIVE DIAGNOSIS:  Menorrhagia  POST-OPERATIVE DIAGNOSIS:  Menorrhagia  PROCEDURE:  Diagnostic laparoscopy, lysis of adhesions for approximately 90 minutes, total laparoscopic hysterectomy, bilateral salpingectomy, cystoscopy  SURGEON:  Surgeon(s) and Role:    * Dufur Bing, MD - Primary  ASSISTANTS:    Hermina Staggers, MD - Assisting   ANESTHESIA:   local and general  EBL:  250 mL   BLOOD ADMINISTERED:none  DRAINS:  foley, UOP    LOCAL MEDICATIONS USED:  MARCAINE     SPECIMEN:  cervix, uterus, bilateral tubes  DISPOSITION OF SPECIMEN:  PATHOLOGY  COUNTS:  YES  TOURNIQUET:  * No tourniquets in log *  DICTATION: .Note written in EPIC  PLAN OF CARE: Admit for overnight observation  PATIENT DISPOSITION:  PACU - hemodynamically stable.   Delay start of Pharmacological VTE agent (>24hrs) due to surgical blood loss or risk of bleeding: not applicable  Cornelia Copa MD Attending Center for [facility] (Faculty Practice) 12/11/2022 Time: [phone]

## 2022-12-11 NOTE — Transfer of Care (Signed)
Immediate Anesthesia Transfer of Care Note  Patient: Brandi [name]  Procedure(s) Performed: TOTAL LAPAROSCOPIC HYSTERECTOMY WITH SALPINGECTOMY (Bilateral: Abdomen) CYSTOSCOPY  Patient Location: PACU  Anesthesia Type:General  Level of Consciousness: sedated  Airway & Oxygen Therapy: Patient Spontanous Breathing and Patient connected to nasal cannula oxygen  Post-op Assessment: Report given to RN and Post -op Vital signs reviewed and stable  Post vital signs: Reviewed and stable  Last Vitals:  Vitals Value Taken Time  BP 146/98 12/11/22 1425  Temp 37.7 C 12/11/22 1425  Pulse 89 12/11/22 1427  Resp 21 12/11/22 1427  SpO2 93 % 12/11/22 1427  Vitals shown include unvalidated device data.  Last Pain:  Vitals:   12/11/22 0825  TempSrc:   PainSc: 0-No pain         Complications: No notable events documented.

## 2022-12-12 ENCOUNTER — Encounter (HOSPITAL_COMMUNITY): Payer: Self-pay | Admitting: Obstetrics and Gynecology

## 2022-12-12 ENCOUNTER — Encounter: Payer: Self-pay | Admitting: Obstetrics and Gynecology

## 2022-12-12 ENCOUNTER — Other Ambulatory Visit: Payer: Self-pay | Admitting: Obstetrics and Gynecology

## 2022-12-12 DIAGNOSIS — R0683 Snoring: Secondary | ICD-10-CM

## 2022-12-12 DIAGNOSIS — N920 Excessive and frequent menstruation with regular cycle: Secondary | ICD-10-CM | POA: Diagnosis not present

## 2022-12-12 MED ORDER — OXYCODONE-ACETAMINOPHEN 5-325 MG PO TABS: 1.0000 | ORAL_TABLET | Freq: Four times a day (QID) | ORAL | 0 refills | Status: DC | PRN

## 2022-12-12 MED ORDER — POLYETHYLENE GLYCOL 3350 17 G PO PACK: 17.0000 g | PACK | Freq: Every day | ORAL | 0 refills | Status: AC

## 2022-12-12 MED ORDER — GABAPENTIN 100 MG PO CAPS: 200.0000 mg | ORAL_CAPSULE | Freq: Two times a day (BID) | ORAL | 0 refills | Status: DC

## 2022-12-12 MED ORDER — IBUPROFEN 600 MG PO TABS: 600.0000 mg | ORAL_TABLET | Freq: Four times a day (QID) | ORAL | 0 refills | Status: AC | PRN

## 2022-12-12 MED ORDER — SIMETHICONE 80 MG PO CHEW: 80.0000 mg | CHEWABLE_TABLET | Freq: Two times a day (BID) | ORAL | 0 refills | Status: DC

## 2022-12-12 NOTE — Anesthesia Postprocedure Evaluation (Signed)
Anesthesia Post Note  Patient: Brandi [name]  Procedure(s) Performed: TOTAL LAPAROSCOPIC HYSTERECTOMY WITH SALPINGECTOMY (Bilateral: Abdomen) CYSTOSCOPY     Patient location during evaluation: PACU Anesthesia Type: General Level of consciousness: awake and alert Pain management: pain level controlled Vital Signs Assessment: post-procedure vital signs reviewed and stable Respiratory status: spontaneous breathing, nonlabored ventilation, respiratory function stable and patient connected to nasal cannula oxygen Cardiovascular status: blood pressure returned to baseline and stable Postop Assessment: no apparent nausea or vomiting Anesthetic complications: no   No notable events documented.  Last Vitals:  Vitals:   12/12/22 0245 12/12/22 0455  BP:  134/61  Pulse:  88  Resp:  17  Temp:  36.7 C  SpO2: 96% 96%    Last Pain:  Vitals:   12/12/22 0800  TempSrc:   PainSc: 2                  Zacharey Jensen S

## 2022-12-12 NOTE — Plan of Care (Signed)
Pt to be discharged home with printed instructions. Braxton Weisbecker L Latessa Tillis, RN  

## 2022-12-12 NOTE — Discharge Instructions (Signed)
Laparoscopic Surgery Discharge Instructions ° °Instructions Following Major Laparoscopic Surgery °You have just undergone a major laparoscopic surgery.  The following list should answer your most common questions.  Although we will discuss your surgery and post-operative instructions with you prior to your discharge, this list will serve as a reminder if you fail to recall the details of what we discussed. ° °We will discuss your surgery once again in detail at your post-op visit in two to four weeks. If you haven’t already done so, please call to make your appointment as soon as possible. ° °How you will feel: Although you have just undergone a major surgery, your recovery will be significantly shorter since the surgery was performed through much smaller incisions than the traditional approach.  You should feel slightly better each day.  If you suddenly feel much worse than the prior day, please call the clinic.  It’s important during the early part of your recovery that you maintain some activity.  Walking is encouraged.  You will quicken your recovery by continued activity. ° °Incision:  Your incisions will be closed with dissolvable stitches or surgical adhesive (glue).  There may be Band-aids and/or Steri-strips covering your incisions.  If there is no drainage from the incisions you may remove the Band-aids in one to two days.  You may notice some minor bruising at the incision sites.  This is common and will resolve within several days.  Please inform us if the redness at the edges of your incision appears to be spreading.  If the skin around your incision becomes warm to the touch, or if you notice a pus-like drainage, please call the office. ° °Vaginal Discharge Following a Laparoscopic Hysterectomy: Minor vaginal bleeding or spotting is normal following a hysterectomy.  Bleeding similar to the amount of your period is excessive, and you should inform us of this immediately.  Vaginal spotting may continue  for several weeks following your surgery.  You may notice a yellowish discharge which occasionally occurs as the vaginal stitches dissolve, and may last for several weeks. ° °Sexual Activity Following a Hysterectomy: Do not have sexual intercourse or place tampons or douches in the vagina prior to your first office visit.  We will discuss when you may resume these activities at that visit.   ° °Stairs/Driving/Activities: You may climb stairs if necessary.  If you’ve had general anesthesia, do not drive a car the rest of the day today.  You may begin light housework when you feel up to it, but avoid heavy lifting (more than 15-20lbs) or pushing until cleared for these activities by your physician. ° °Hygiene:  Do not soak your incisions.  Showers are acceptable but you may not take a bath or swim in a pool.  Cleanse your incisions daily with soap and water. ° °Medications:  Please resume taking any medications that you were taking prior to the surgery.  If we have prescribed any new medications for you, please take them as directed. ° °Constipation:  It is fairly common to experience some difficulty in moving your bowels following major surgery.  Being active will help to reduce this likelihood. A diet rich in fiber and plenty of liquids is desirable.  If you do become constipated, a mild laxative such as Miralax, Milk of Magnesia, or Metamucil, or a stool softener such as Colace, is recommended. ° °General Instructions: If you develop a fever of 100.5 degrees or higher, please call the office number(s) below for physician on call. ° ° °

## 2022-12-12 NOTE — Discharge Summary (Signed)
  Gynecology Discharge Summary Date of Admission: 12/11/2022 Date of Discharge: 12/12/2022  The patient was admitted, as scheduled, and underwent a diagnostic laparoscopy, LOA approximately 90 minutes, TLH/BS/cysto; please refer to operative note for full details.  NAD/CTAB/normal s1 and s2, no MRGs/abdomen: +BS, soft, non distended, c/d/I l/s port sites with large bruise at the RLQ port site, no e/o hernia at sites.  She was meeting all post op goals and discharged to home on POD#1.  Patient had negative physical exam. She had some O2 sats to low 90s overnight. She was 94-95% on RA during my visit today on room air. She states she smokes and when she had a "smoker's cough" her O2 sats went up immdiately to above 96% and stayed there. She states that she was told that she does snore.  Tobacco cessation recommended and will refer for sleep study as outpatient.   Allergies as of 12/12/2022       Reactions   Viberzi [eluxadoline] Itching   Amoxicillin Nausea And Vomiting, Rash        Medication List     STOP taking these medications    tranexamic acid 650 MG Tabs tablet Commonly known as: LYSTEDA       TAKE these medications    gabapentin 100 MG capsule Commonly known as: NEURONTIN Take 2 capsules (200 mg total) by mouth 2 (two) times daily for 7 days.   ibuprofen 600 MG tablet Commonly known as: ADVIL Take 1 tablet (600 mg total) by mouth every 6 (six) hours as needed.   metroNIDAZOLE 500 MG tablet Commonly known as: FLAGYL Take 1 tablet (500 mg total) by mouth 2 (two) times daily.   NIFEdipine 30 MG 24 hr tablet Commonly known as: PROCARDIA-XL/NIFEDICAL-XL Take 30 mg by mouth daily.   omeprazole 20 MG capsule Commonly known as: PRILOSEC Take 1 capsule (20 mg total) by mouth daily.   oxyCODONE-acetaminophen 5-325 MG tablet Commonly known as: PERCOCET/ROXICET Take 1-2 tablets by mouth every 6 (six) hours as needed.   polyethylene glycol 17 g packet Commonly known  as: MIRALAX / GLYCOLAX Take 17 g by mouth daily for 21 days.   simethicone 80 MG chewable tablet Commonly known as: MYLICON Chew 1 tablet (80 mg total) by mouth 2 (two) times daily for 7 days.        No future appointments. Request sent to office for 6 week f/u visit  Cornelia Copa MD Attending Center for [facility])

## 2022-12-13 LAB — SURGICAL PATHOLOGY

## 2022-12-16 ENCOUNTER — Other Ambulatory Visit: Payer: Self-pay | Admitting: Obstetrics and Gynecology

## 2022-12-16 IMAGING — DX DG KNEE 1-2V*L*
2 series · 2 of 2 positions shown · non-contrast
Comparison: None.

CLINICAL DATA: Fall, knee pain

EXAM:
LEFT KNEE - 1-2 VIEW

[knee ap]
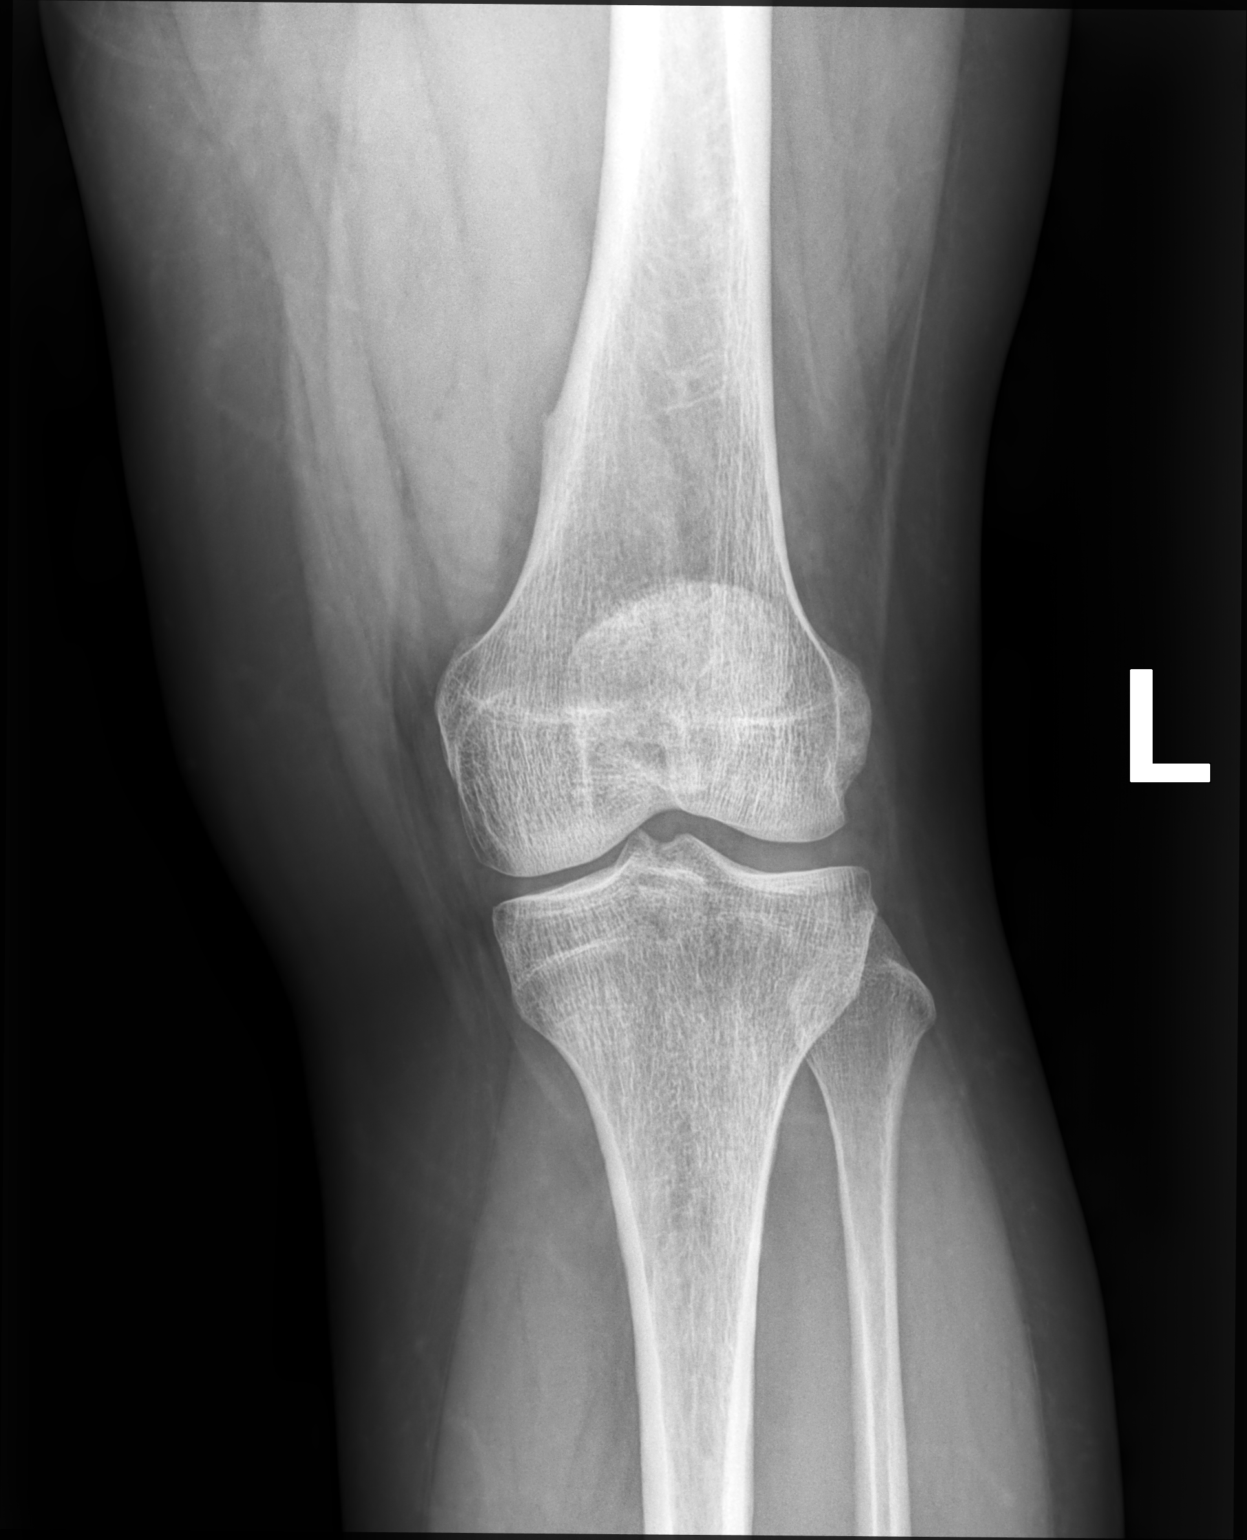

[knee lat]
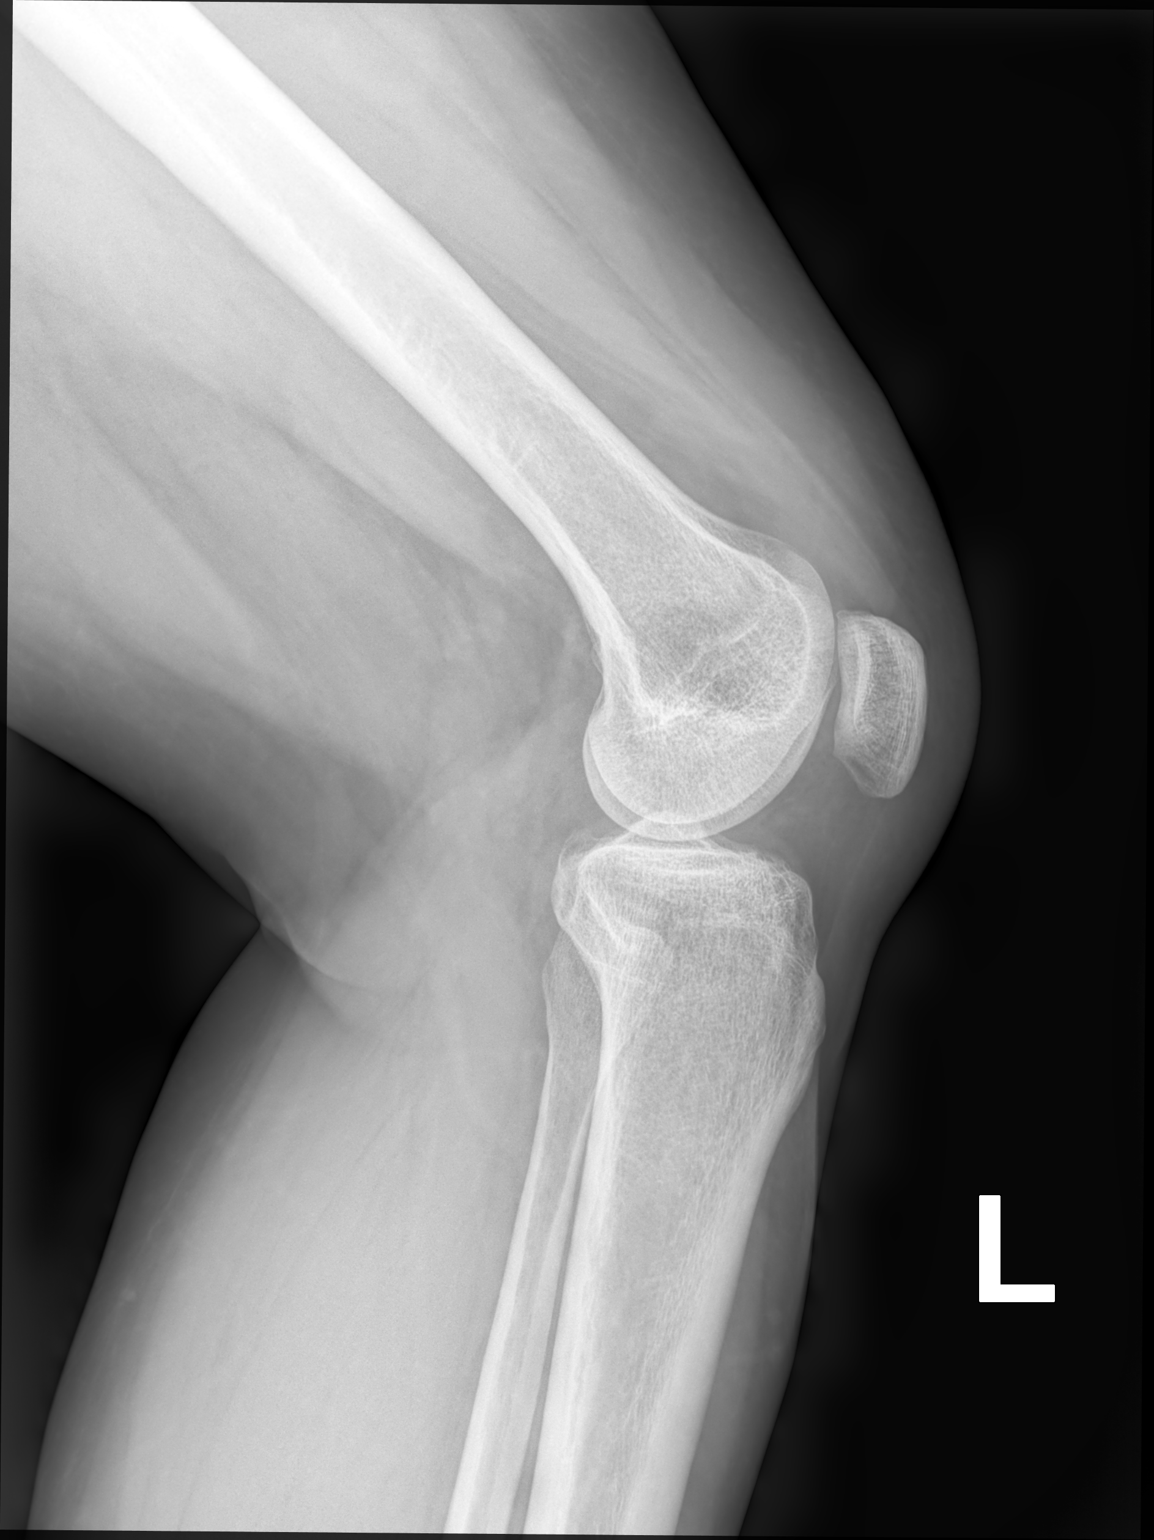

[2 of 2 positions shown; findings below may reference images not displayed]

FINDINGS: No evidence of fracture, dislocation, or joint effusion. No evidence
of arthropathy or other focal bone abnormality. Prepatellar soft
tissue prominence/swelling.
IMPRESSION: No acute osseous abnormality identified. Prepatellar soft tissue
prominence/swelling.

## 2022-12-16 IMAGING — DX DG WRIST COMPLETE 3+V*R*
4 series · 4 of 4 positions shown · non-contrast
Comparison: None.

CLINICAL DATA: Right wrist pain

EXAM:
RIGHT WRIST - COMPLETE 3+ VIEW

[wrist pa]
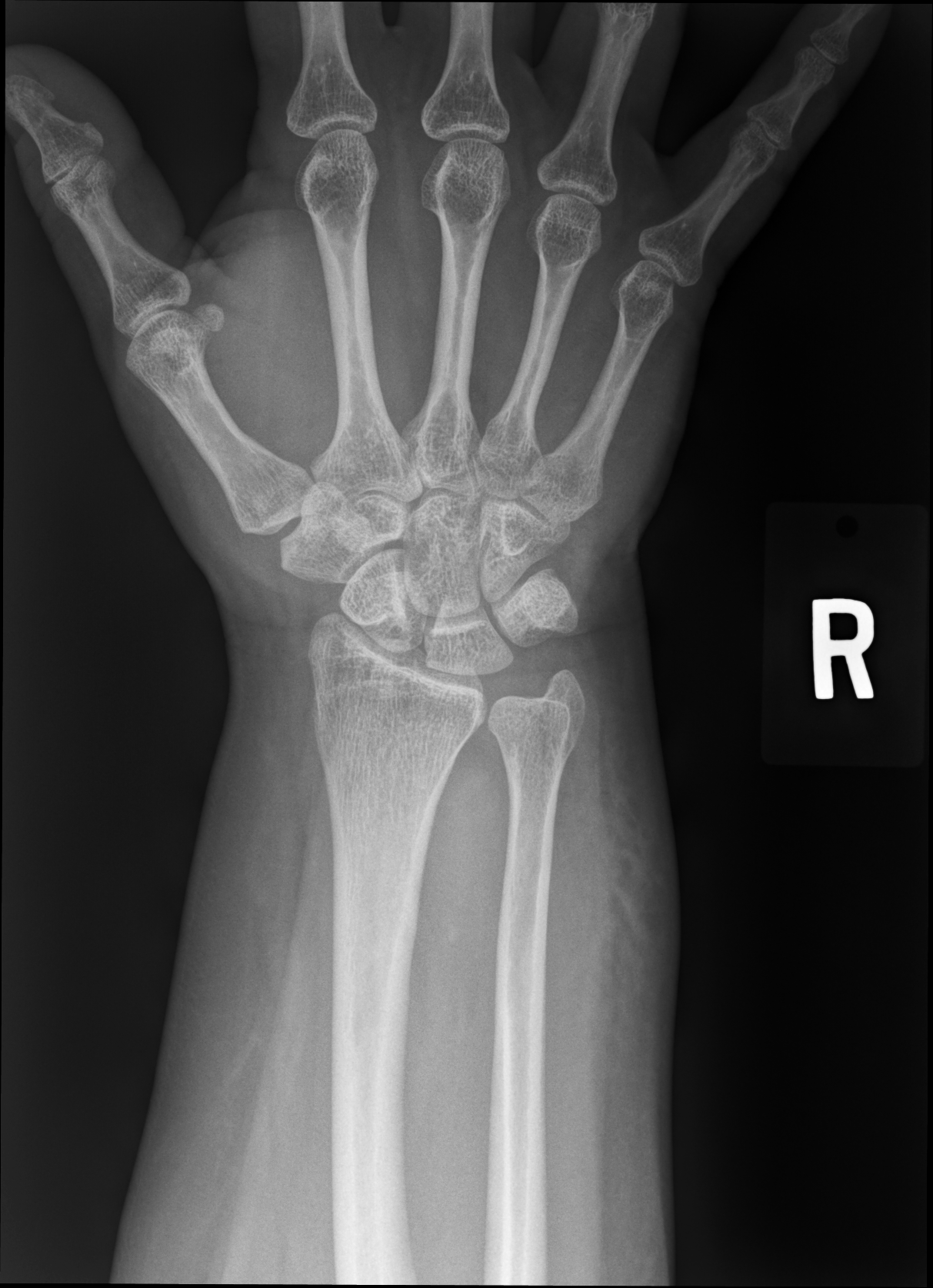

[wrist mlo]
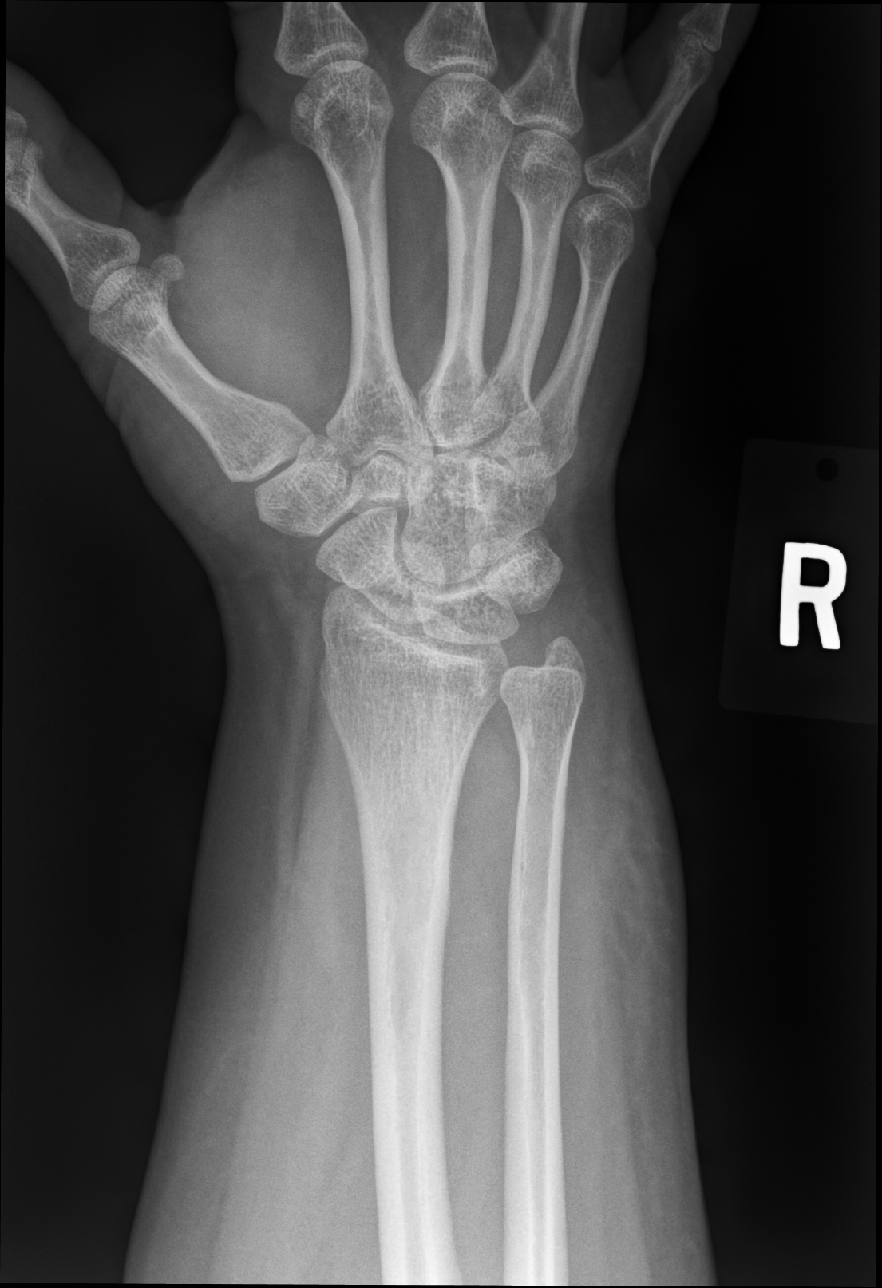

[wrist lat]
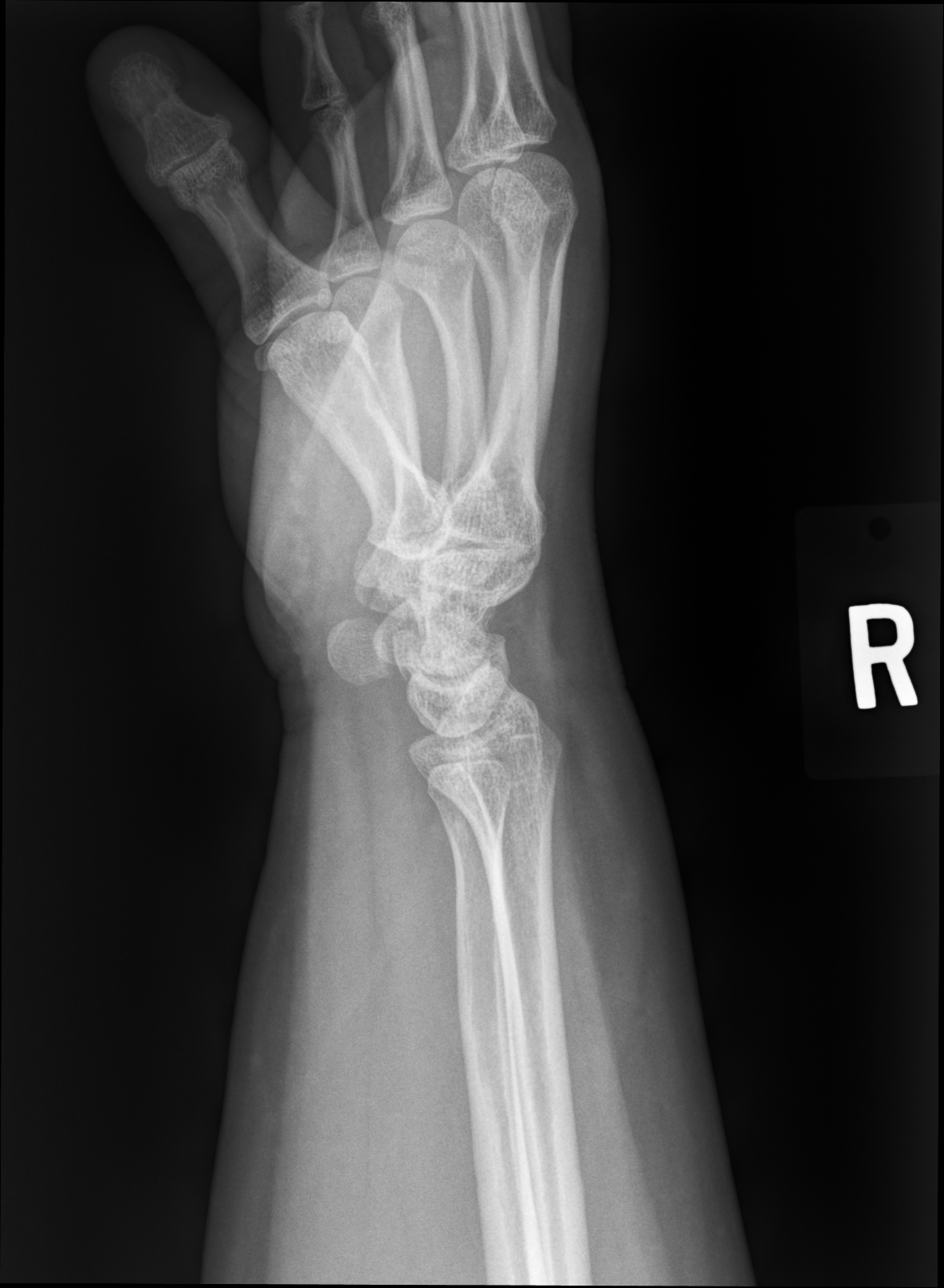

[scaphoid pa]
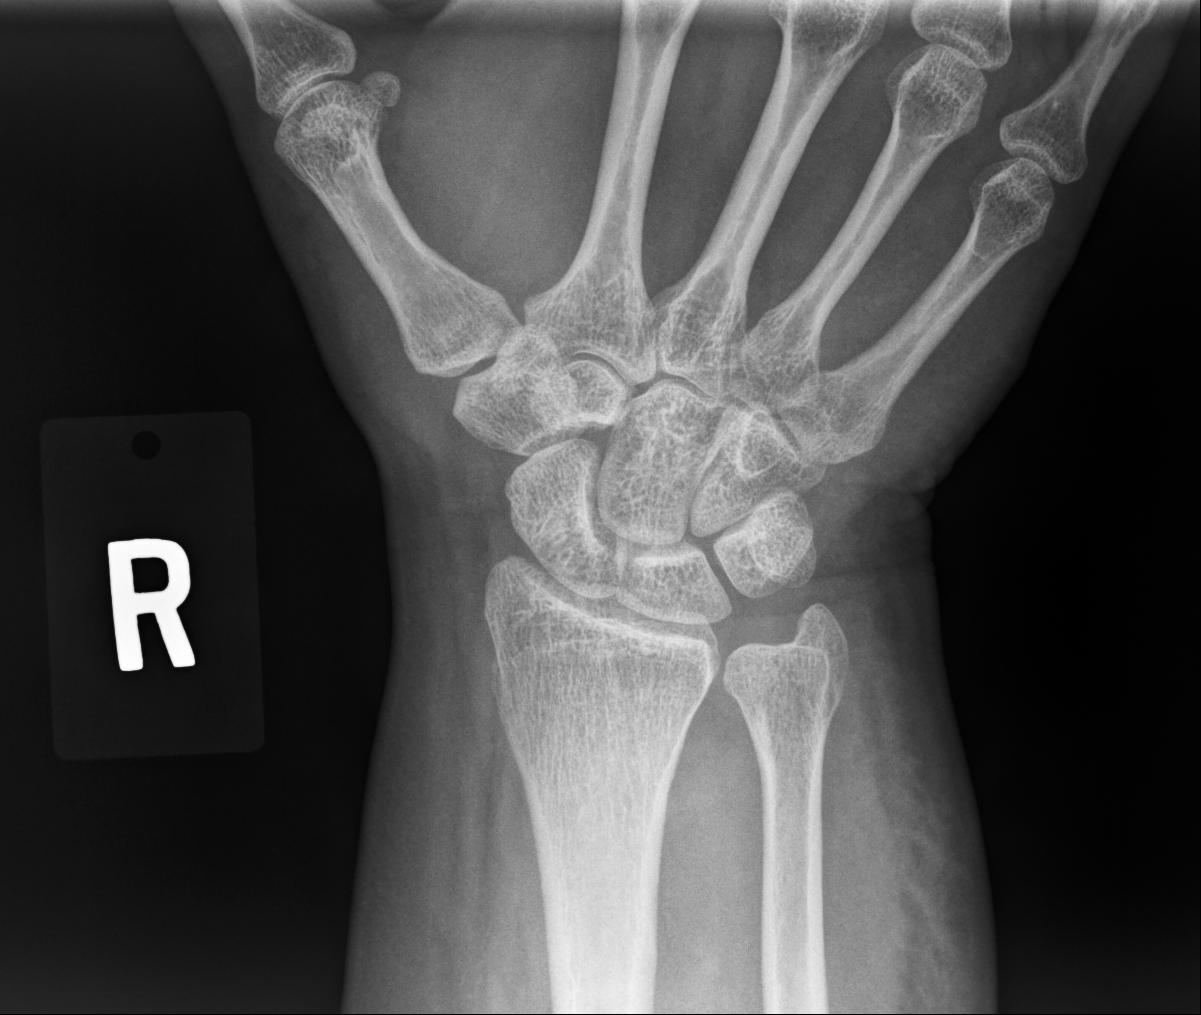

[4 of 4 positions shown; findings below may reference images not displayed]

FINDINGS: There is no evidence of fracture or dislocation. There is no
evidence of arthropathy or other focal bone abnormality. Soft
tissues are unremarkable.
IMPRESSION: No acute osseous abnormality identified.

## 2022-12-17 ENCOUNTER — Other Ambulatory Visit: Payer: Self-pay | Admitting: Family Medicine

## 2022-12-17 ENCOUNTER — Encounter: Payer: Self-pay | Admitting: Obstetrics and Gynecology

## 2022-12-18 ENCOUNTER — Other Ambulatory Visit: Payer: Self-pay | Admitting: Obstetrics and Gynecology

## 2022-12-18 ENCOUNTER — Other Ambulatory Visit: Payer: Self-pay | Admitting: *Deleted

## 2022-12-18 MED ORDER — NIFEDIPINE ER OSMOTIC RELEASE 30 MG PO TB24: 30.0000 mg | ORAL_TABLET | Freq: Every day | ORAL | 0 refills | Status: DC

## 2022-12-30 ENCOUNTER — Other Ambulatory Visit: Payer: Self-pay | Admitting: Internal Medicine

## 2022-12-30 DIAGNOSIS — K219 Gastro-esophageal reflux disease without esophagitis: Secondary | ICD-10-CM

## 2023-01-01 NOTE — Telephone Encounter (Signed)
Requested Prescriptions  Pending Prescriptions Disp Refills   omeprazole (PRILOSEC) 20 MG capsule [Pharmacy Med Name: OMEPRAZOLE 20MG  CAPSULES] 90 capsule 0    Sig: TAKE 1 CAPSULE(20 MG) BY MOUTH DAILY     Gastroenterology: Proton Pump Inhibitors Passed - 12/30/2022 10:11 AM      Passed - Valid encounter within last 12 months    Recent Outpatient Visits           5 months ago Encounter for general adult medical examination with abnormal findings   [place] [facility] [place], Salvadore Oxford, NP   11 months ago Prediabetes   [place] [facility] [place], Salvadore Oxford, NP   1 year ago Encounter for general adult medical examination with abnormal findings   [facility] [place], Salvadore Oxford, NP   1 year ago Essential hypertension   [place] [facility] [place], [place] W, NP   2 years ago Chronic bilateral low back pain without sciatica   [facility], Jodelle Gross, [place]

## 2023-01-06 ENCOUNTER — Encounter: Payer: Self-pay | Admitting: Obstetrics and Gynecology

## 2023-01-06 DIAGNOSIS — N870 Mild cervical dysplasia: Secondary | ICD-10-CM | POA: Insufficient documentation

## 2023-01-15 ENCOUNTER — Encounter: Payer: Self-pay | Admitting: Internal Medicine

## 2023-01-15 MED ORDER — LISINOPRIL-HYDROCHLOROTHIAZIDE 10-12.5 MG PO TABS: 1.0000 | ORAL_TABLET | Freq: Every day | ORAL | 0 refills | Status: DC

## 2023-01-16 ENCOUNTER — Encounter: Payer: Self-pay | Admitting: Obstetrics and Gynecology

## 2023-01-16 ENCOUNTER — Ambulatory Visit (INDEPENDENT_AMBULATORY_CARE_PROVIDER_SITE_OTHER): Payer: BC Managed Care – PPO | Admitting: Obstetrics and Gynecology

## 2023-01-16 ENCOUNTER — Other Ambulatory Visit: Payer: Self-pay | Admitting: Obstetrics and Gynecology

## 2023-01-16 ENCOUNTER — Other Ambulatory Visit (HOSPITAL_COMMUNITY)
Admission: RE | Admit: 2023-01-16 | Discharge: 2023-01-16 | Disposition: A | Discharge status: Home / Self Care | Payer: BC Managed Care – PPO | Source: Ambulatory Visit | Attending: Obstetrics and Gynecology | Admitting: Obstetrics and Gynecology

## 2023-01-16 VITALS — BP 147/86 | HR 89 | Wt 236.0 lb

## 2023-01-16 DIAGNOSIS — N870 Mild cervical dysplasia: Secondary | ICD-10-CM | POA: Insufficient documentation

## 2023-01-16 DIAGNOSIS — Z09 Encounter for follow-up examination after completed treatment for conditions other than malignant neoplasm: Secondary | ICD-10-CM | POA: Insufficient documentation

## 2023-01-16 NOTE — Progress Notes (Signed)
CC: switching back to lisinopril per PCP

## 2023-01-16 NOTE — Progress Notes (Signed)
Obstetrics and Gynecology Visit Return Patient Evaluation  Appointment Date: 01/16/2023  Primary Care Provider: Emmit Alexanders Clinic: Center for [facility]  Chief Complaint: routine post op check  History of Present Illness:  Brandi [name] is a 37 y.o. s/p 4/9 diagnostic laparoscopy, LOA approximately 90 minutes, TLH/BS/cysto for heavy and painful periods. Final pathology significant for CIN 1 on the cervix; she was discharged to home on POD#1.   Interval History: Since that time, she states that she's doing well and no problems or issues; no sexual intercourse yet.   Review of Systems: as noted in the History of Present Illness.  Patient Active Problem List   Diagnosis Date Noted   Dysplasia of cervix, low grade (CIN 1) 01/06/2023   History of gestational diabetes 09/05/2022   Class 2 obesity due to excess calories with body mass index (BMI) of 38.0 to 38.9 in adult 02/22/2022   Mixed hyperlipidemia 07/20/2021   Genital herpes 02/03/2021   Chronic lower back pain 05/30/2020   History of migraine 05/30/2020   Chronic pain of both knees 05/30/2020   Irritable bowel syndrome with diarrhea 10/22/2018   Essential hypertension 10/22/2018   Recurrent major depressive disorder, in partial remission (HCC) 10/22/2018   Gastroesophageal reflux disease 10/22/2018   Medications:  Alex Gardener had no medications administered during this visit. Current Outpatient Medications  Medication Sig Dispense Refill   NIFEdipine (PROCARDIA-XL/NIFEDICAL-XL) 30 MG 24 hr tablet Take 1 tablet (30 mg total) by mouth daily. 30 tablet 0   omeprazole (PRILOSEC) 20 MG capsule TAKE 1 CAPSULE(20 MG) BY MOUTH DAILY 90 capsule 0   gabapentin (NEURONTIN) 100 MG capsule Take 2 capsules (200 mg total) by mouth 2 (two) times daily for 7 days. 28 capsule 0   ibuprofen (ADVIL) 600 MG tablet Take 1 tablet (600 mg total) by mouth every 6 (six) hours as needed. (Patient not taking:  Reported on 01/16/2023) 30 tablet 0   lisinopril-hydrochlorothiazide (ZESTORETIC) 10-12.5 MG tablet Take 1 tablet by mouth daily. (Patient not taking: Reported on 01/16/2023) 90 tablet 0   metroNIDAZOLE (FLAGYL) 500 MG tablet Take 1 tablet (500 mg total) by mouth 2 (two) times daily. (Patient not taking: Reported on 11/28/2022) 14 tablet 0   simethicone (MYLICON) 80 MG chewable tablet Chew 1 tablet (80 mg total) by mouth 2 (two) times daily for 7 days. 30 tablet 0   No current facility-administered medications for this visit.    Allergies: is allergic to viberzi [eluxadoline] and amoxicillin.  Physical Exam:  BP (!) 137/92   Pulse 89   Wt 236 lb (107 kg)   LMP 09/27/2022   BMI 39.27 kg/m  Body mass index is 39.27 kg/m. General appearance: Well nourished, well developed female in no acute distress.  Abdomen: diffusely non tender to palpation, non distended, and no masses, hernias; well healed l/s port sites Neuro/Psych:  Normal mood and affect.    Pelvic exam:  Cervical exam performed in the presence of a chaperone EGBUS: wnl Vaginal vault: wnl, white/yellow d/c noted Cuff: well healed, nttp, wnl Bimanual: negative   Assessment: pt doing well  Plan:  1. Dysplasia of cervix, low grade (CIN 1) Recommend repeat pap smear one year  2. Postop check Doing well. Recommend wait until mid June for full 10weeks before sexual intercourse and abdominal exercises. Swab done today.    RTC: 1 year for pap.   No follow-ups on file.  No future appointments.  Cornelia Copa MD  Attending Center for [employer])

## 2023-01-17 ENCOUNTER — Other Ambulatory Visit: Payer: Self-pay | Admitting: *Deleted

## 2023-01-17 ENCOUNTER — Encounter: Payer: Self-pay | Admitting: Obstetrics and Gynecology

## 2023-01-17 LAB — CERVICOVAGINAL ANCILLARY ONLY
Bacterial Vaginitis (gardnerella): POSITIVE — AB
Candida Glabrata: NEGATIVE
Candida Vaginitis: NEGATIVE
Comment: NEGATIVE
Comment: NEGATIVE
Comment: NEGATIVE

## 2023-01-17 MED ORDER — METRONIDAZOLE 500 MG PO TABS: 500.0000 mg | ORAL_TABLET | Freq: Two times a day (BID) | ORAL | 0 refills | Status: DC

## 2023-01-21 ENCOUNTER — Encounter: Payer: Self-pay | Admitting: Obstetrics and Gynecology

## 2023-01-21 ENCOUNTER — Encounter: Payer: Self-pay | Admitting: *Deleted

## 2023-03-28 IMAGING — US US OB LIMITED
1 series · 7 of 7 positions shown · non-contrast
Comparison: none

[Series 1: us ob limited · 7 of 7 slices shown]
[im 1/7]
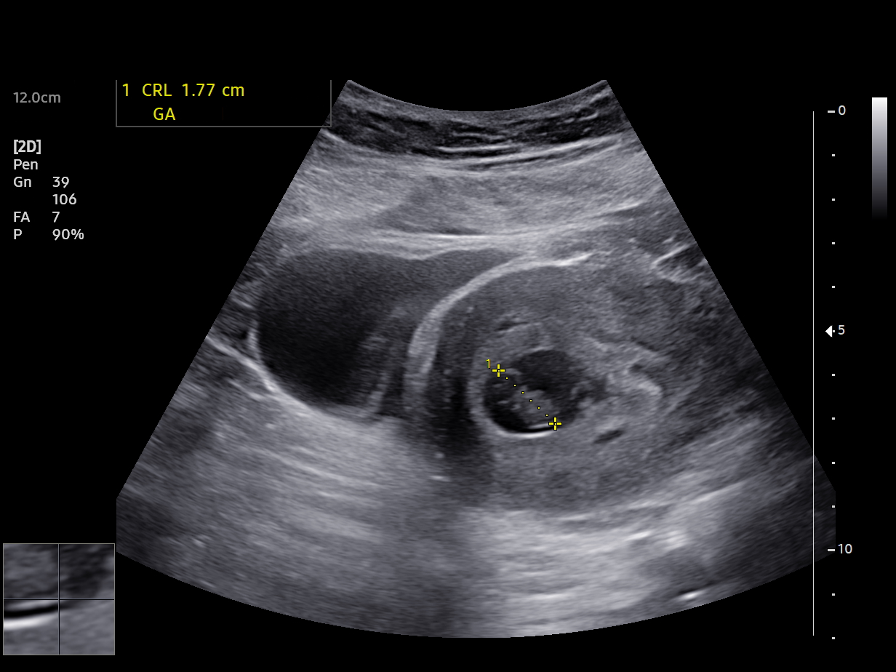
[im 2/7]
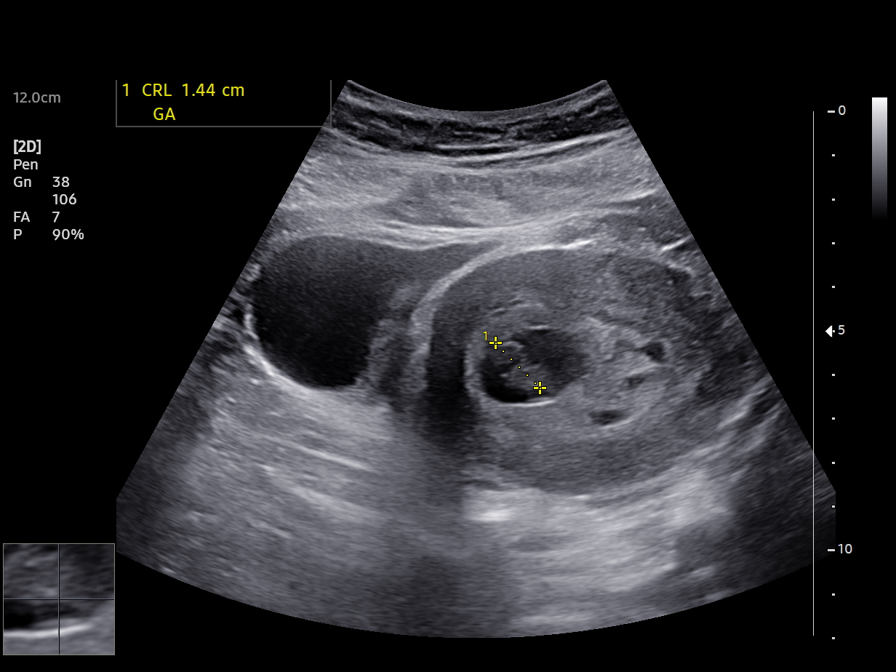
[im 3/7]
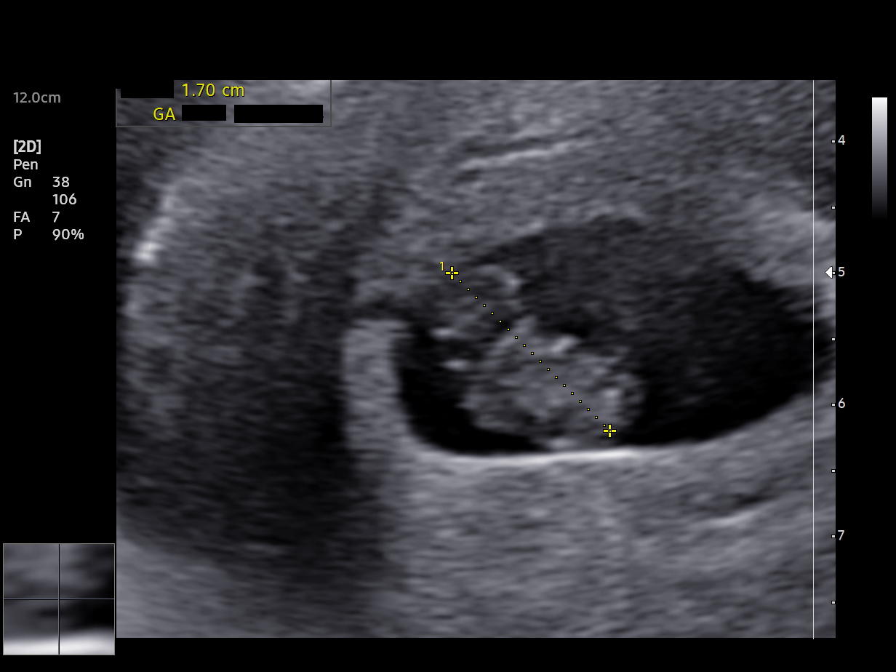
[im 4/7]
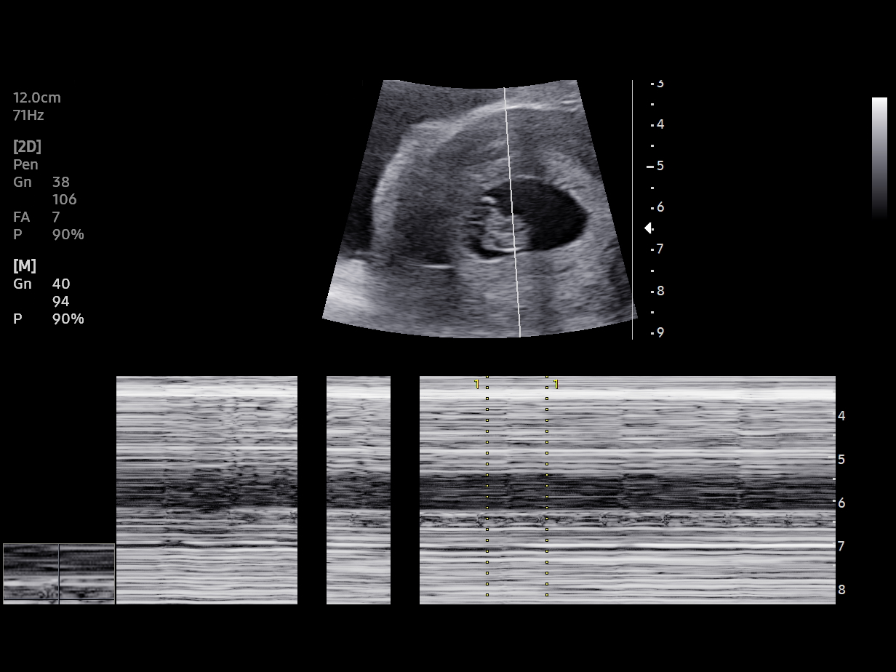
[im 5/7]
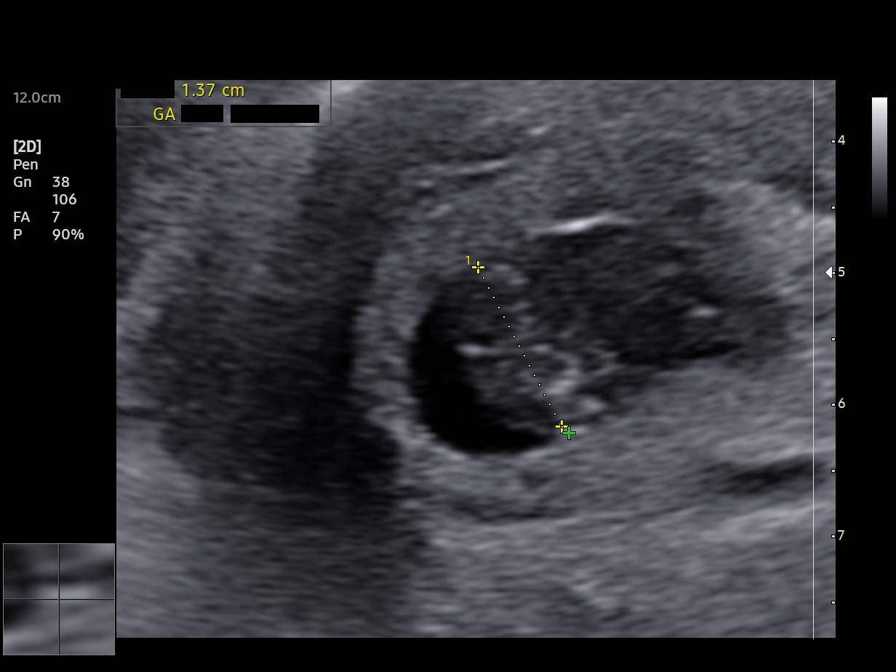
[im 6/7]
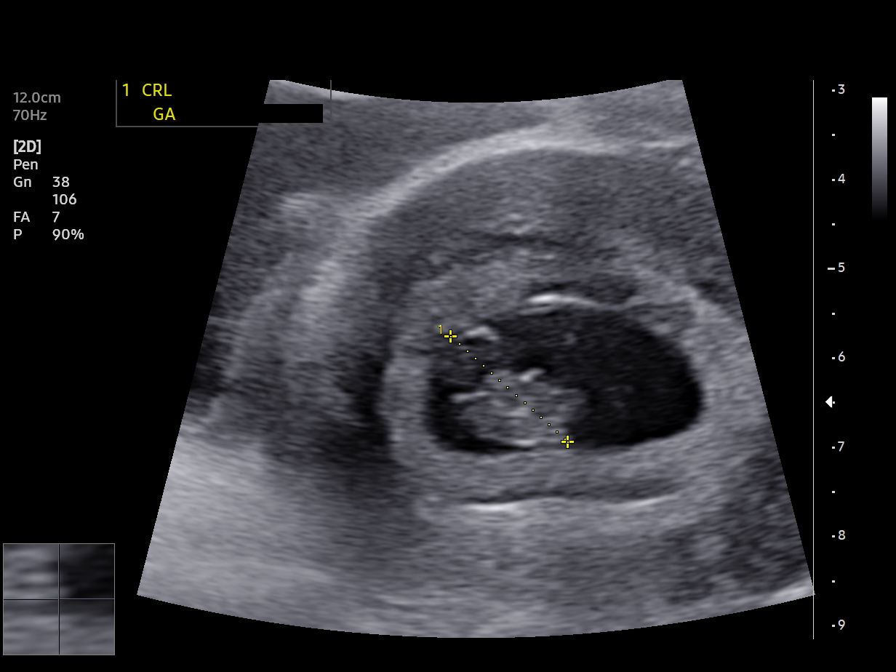
[im 7/7]
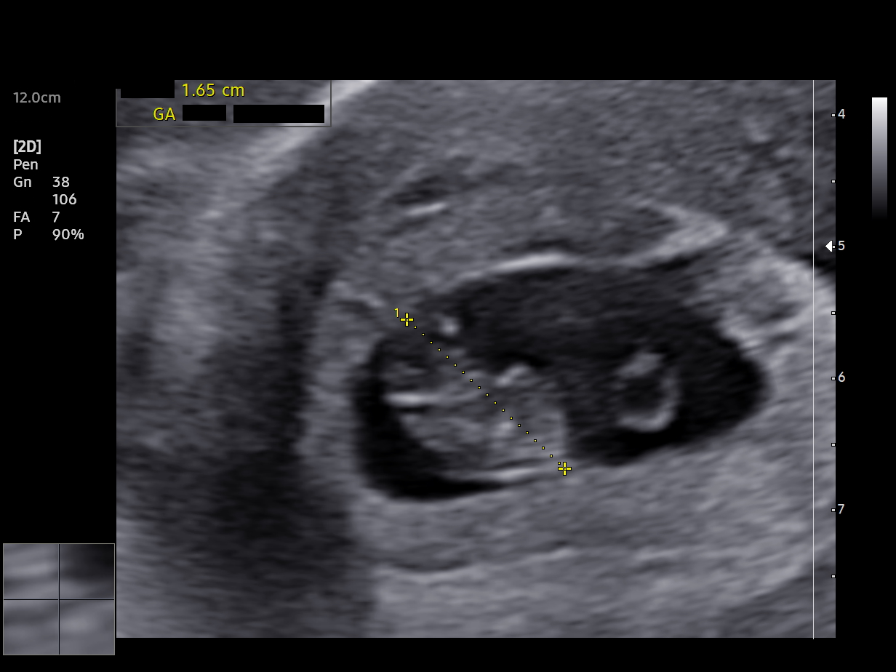

[7 of 7 positions shown; findings below may reference images not displayed]

Women?s
                                                            [facility]
                                                            [place]

 1  [HOSPITAL]                         [id number]     LUSINE JIM

Indications

 8 weeks gestation of pregnancy
Fetal Evaluation

 Num Of Fetuses:         1
 Fetal Heart Rate(bpm):  172
 Cardiac Activity:       Observed
Biometry

 CRL:      16.5  mm     G. Age:  8w 0d                   EDD:   09/12/22
OB History

 Gravidity:    2         Term:   1        Prem:   0        SAB:   0
 TOP:          0       Ectopic:  0        Living: 1
Gestational Age

 LMP:           8w 6d         Date:  11/30/21                  EDD:   09/06/22
 Best:          8w 0d      Det. By:  U/S C R L (01/31/22)     EDD:   09/12/22
Impression

 Single living IUP
 size not c/w dates
 EDC by today's u/s
Recommendations
 Follow-up scans as clinically indicated.
                 Steckler, Marga

## 2023-04-05 ENCOUNTER — Other Ambulatory Visit: Payer: Self-pay | Admitting: Internal Medicine

## 2023-04-05 DIAGNOSIS — K219 Gastro-esophageal reflux disease without esophagitis: Secondary | ICD-10-CM

## 2023-04-05 MED ORDER — OMEPRAZOLE 20 MG PO CPDR: 20.0000 mg | DELAYED_RELEASE_CAPSULE | Freq: Every day | ORAL | 0 refills | Status: DC

## 2023-04-08 NOTE — Telephone Encounter (Signed)
3 days ago (04/05/2023) pt needs appt for further refills  Requested Prescriptions  Refused Prescriptions Disp Refills   omeprazole (PRILOSEC) 20 MG capsule [Pharmacy Med Name: OMEPRAZOLE 20MG  CAPSULES] 90 capsule     Sig: TAKE 1 CAPSULE(20 MG) BY MOUTH DAILY     Gastroenterology: Proton Pump Inhibitors Passed - 04/05/2023  4:16 PM      Passed - Valid encounter within last 12 months    Recent Outpatient Visits           8 months ago Encounter for general adult medical examination with abnormal findings   [place] [facility] [place], Salvadore Oxford, NP   1 year ago Prediabetes   [place] [facility] [place], Salvadore Oxford, NP   1 year ago Encounter for general adult medical examination with abnormal findings   [place] [facility] [place], Salvadore Oxford, NP   2 years ago Essential hypertension   [place] [facility] [place], [place] W, NP   2 years ago Chronic bilateral low back pain without sciatica   [facility], Jodelle Gross, [place]

## 2023-04-13 ENCOUNTER — Other Ambulatory Visit: Payer: Self-pay | Admitting: Internal Medicine

## 2023-04-15 ENCOUNTER — Other Ambulatory Visit: Payer: Self-pay | Admitting: Internal Medicine

## 2023-04-15 NOTE — Telephone Encounter (Signed)
Courtesy refill. Requested Prescriptions  Pending Prescriptions Disp Refills   lisinopril-hydrochlorothiazide (ZESTORETIC) 10-12.5 MG tablet [Pharmacy Med Name: LISINOPRIL-HCTZ 10/12.5MG  TABLETS] 30 tablet 0    Sig: TAKE 1 TABLET BY MOUTH DAILY     Cardiovascular:  ACEI + Diuretic Combos Failed - 04/13/2023  3:34 AM      Failed - Last BP in normal range    BP Readings from Last 1 Encounters:  01/16/23 (!) 147/86         Failed - Valid encounter within last 6 months    Recent Outpatient Visits           8 months ago Encounter for general adult medical examination with abnormal findings   [place] [facility] [place], [place], NP   1 year ago Prediabetes   [place] [facility] [place], Salvadore Oxford, NP   1 year ago Encounter for general adult medical examination with abnormal findings   [place] [facility] [place], Salvadore Oxford, NP   2 years ago Essential hypertension   [place] [facility] [place], Salvadore Oxford, NP   2 years ago Chronic bilateral low back pain without sciatica    [facility], Jodelle Gross, [place]              Passed - Na in normal range and within 180 days    Sodium  Date Value Ref Range Status  12/11/2022 139 135 - 145 mmol/L Final         Passed - K in normal range and within 180 days    Potassium  Date Value Ref Range Status  12/11/2022 3.5 3.5 - 5.1 mmol/L Final         Passed - Cr in normal range and within 180 days    Creat  Date Value Ref Range Status  08/03/2022 0.92 0.50 - 0.97 mg/dL Final   Creatinine, Ser  Date Value Ref Range Status  12/11/2022 0.70 0.44 - 1.00 mg/dL Final         Passed - eGFR is 30 or above and within 180 days    GFR, Est African American  Date Value Ref Range Status  05/30/2020 130 > OR = 60 mL/min/1.15m2 Final   GFR, Est Non African American  Date Value Ref Range Status  05/30/2020 112 > OR = 60 mL/min/1.22m2 Final   GFR,  Estimated  Date Value Ref Range Status  12/11/2022 >60 >60 mL/min Final    Comment:    (NOTE) Calculated using the CKD-EPI Creatinine Equation (2021)    eGFR  Date Value Ref Range Status  08/03/2022 83 > OR = 60 mL/min/1.51m2 Final         Passed - Patient is not pregnant

## 2023-04-16 ENCOUNTER — Other Ambulatory Visit: Payer: Self-pay | Admitting: Internal Medicine

## 2023-04-16 ENCOUNTER — Encounter: Payer: Self-pay | Admitting: Internal Medicine

## 2023-04-16 NOTE — Telephone Encounter (Signed)
Okay to refill.  I do not recall denying this

## 2023-04-17 MED ORDER — LISINOPRIL-HYDROCHLOROTHIAZIDE 10-12.5 MG PO TABS: 1.0000 | ORAL_TABLET | Freq: Every day | ORAL | 0 refills | Status: DC

## 2023-04-17 NOTE — Telephone Encounter (Signed)
Rx request was refilled 04/17/23, duplicate request.E-Prescribing Status: Receipt confirmed by pharmacy (04/17/2023 10:53 AM EDT).  Requested Prescriptions  Pending Prescriptions Disp Refills   lisinopril-hydrochlorothiazide (ZESTORETIC) 10-12.5 MG tablet [Pharmacy Med Name: LISINOPRIL-HCTZ 10/12.5MG  TABLETS] 90 tablet     Sig: TAKE 1 TABLET BY MOUTH DAILY     Cardiovascular:  ACEI + Diuretic Combos Failed - 04/15/2023  2:25 PM      Failed - Last BP in normal range    BP Readings from Last 1 Encounters:  01/16/23 (!) 147/86         Failed - Valid encounter within last 6 months    Recent Outpatient Visits           8 months ago Encounter for general adult medical examination with abnormal findings   [place] [facility] [place], Salvadore Oxford, NP   1 year ago Prediabetes   [place] [facility] [place], [place], NP   1 year ago Encounter for general adult medical examination with abnormal findings   [place] [facility] [place], Salvadore Oxford, NP   2 years ago Essential hypertension   [place] [facility] [place], Salvadore Oxford, NP   2 years ago Chronic bilateral low back pain without sciatica   [place] [facility], Jodelle [place], FNP       Future Appointments             In 1 week [place], Salvadore Oxford, NP [place] [facility]            Passed - Na in normal range and within 180 days    Sodium  Date Value Ref Range Status  12/11/2022 139 135 - 145 mmol/L Final         Passed - K in normal range and within 180 days    Potassium  Date Value Ref Range Status  12/11/2022 3.5 3.5 - 5.1 mmol/L Final         Passed - Cr in normal range and within 180 days    Creat  Date Value Ref Range Status  08/03/2022 0.92 0.50 - 0.97 mg/dL Final   Creatinine, Ser  Date Value Ref Range Status  12/11/2022 0.70 0.44 - 1.00 mg/dL Final         Passed - eGFR is 30 or above and within 180  days    GFR, Est African American  Date Value Ref Range Status  05/30/2020 130 > OR = 60 mL/min/1.39m2 Final   GFR, Est Non African American  Date Value Ref Range Status  05/30/2020 112 > OR = 60 mL/min/1.94m2 Final   GFR, Estimated  Date Value Ref Range Status  12/11/2022 >60 >60 mL/min Final    Comment:    (NOTE) Calculated using the CKD-EPI Creatinine Equation (2021)    eGFR  Date Value Ref Range Status  08/03/2022 83 > OR = 60 mL/min/1.58m2 Final         Passed - Patient is not pregnant

## 2023-04-17 NOTE — Addendum Note (Signed)
Addended by: Kavin Leech E on: 04/17/2023 10:52 AM   Modules accepted: Orders

## 2023-04-19 NOTE — Telephone Encounter (Signed)
There was a referral for sleep study placed April 2024.  Brandi [name] can you check on this referral for me please.

## 2023-04-29 ENCOUNTER — Ambulatory Visit: Payer: Medicaid Other | Attending: Otolaryngology

## 2023-04-29 ENCOUNTER — Ambulatory Visit (INDEPENDENT_AMBULATORY_CARE_PROVIDER_SITE_OTHER): Payer: Medicaid Other | Admitting: Internal Medicine

## 2023-04-29 ENCOUNTER — Encounter: Payer: Self-pay | Admitting: Internal Medicine

## 2023-04-29 VITALS — BP 112/60 | HR 106 | Temp 96.2°F | Wt 242.0 lb

## 2023-04-29 DIAGNOSIS — G4733 Obstructive sleep apnea (adult) (pediatric): Secondary | ICD-10-CM | POA: Insufficient documentation

## 2023-04-29 DIAGNOSIS — K580 Irritable bowel syndrome with diarrhea: Secondary | ICD-10-CM

## 2023-04-29 DIAGNOSIS — I10 Essential (primary) hypertension: Secondary | ICD-10-CM

## 2023-04-29 DIAGNOSIS — G4736 Sleep related hypoventilation in conditions classified elsewhere: Secondary | ICD-10-CM | POA: Diagnosis not present

## 2023-04-29 DIAGNOSIS — Z6841 Body Mass Index (BMI) 40.0 and over, adult: Secondary | ICD-10-CM

## 2023-04-29 DIAGNOSIS — E782 Mixed hyperlipidemia: Secondary | ICD-10-CM

## 2023-04-29 DIAGNOSIS — M25561 Pain in right knee: Secondary | ICD-10-CM

## 2023-04-29 DIAGNOSIS — K130 Diseases of lips: Secondary | ICD-10-CM | POA: Diagnosis not present

## 2023-04-29 DIAGNOSIS — R7303 Prediabetes: Secondary | ICD-10-CM | POA: Insufficient documentation

## 2023-04-29 DIAGNOSIS — D75839 Thrombocytosis, unspecified: Secondary | ICD-10-CM | POA: Insufficient documentation

## 2023-04-29 DIAGNOSIS — G4761 Periodic limb movement disorder: Secondary | ICD-10-CM | POA: Insufficient documentation

## 2023-04-29 DIAGNOSIS — M25562 Pain in left knee: Secondary | ICD-10-CM

## 2023-04-29 DIAGNOSIS — R0683 Snoring: Secondary | ICD-10-CM | POA: Diagnosis present

## 2023-04-29 DIAGNOSIS — F3341 Major depressive disorder, recurrent, in partial remission: Secondary | ICD-10-CM

## 2023-04-29 DIAGNOSIS — K219 Gastro-esophageal reflux disease without esophagitis: Secondary | ICD-10-CM

## 2023-04-29 DIAGNOSIS — M545 Low back pain, unspecified: Secondary | ICD-10-CM

## 2023-04-29 DIAGNOSIS — G8929 Other chronic pain: Secondary | ICD-10-CM

## 2023-04-29 DIAGNOSIS — G43019 Migraine without aura, intractable, without status migrainosus: Secondary | ICD-10-CM

## 2023-04-29 MED ORDER — TRIAMCINOLONE ACETONIDE 0.1 % EX CREA: 1.0000 | TOPICAL_CREAM | Freq: Two times a day (BID) | CUTANEOUS | 0 refills | Status: DC

## 2023-04-29 NOTE — Assessment & Plan Note (Signed)
Encouraged diet and exercise for weight loss ?

## 2023-04-29 NOTE — Patient Instructions (Signed)

## 2023-04-29 NOTE — Assessment & Plan Note (Signed)
C-Met and lipid profile today Encouraged her to consume a low-fat diet

## 2023-04-29 NOTE — Assessment & Plan Note (Signed)
Encourage weight loss as this can help reduce reflux symptoms Continue omeprazole 

## 2023-04-29 NOTE — Assessment & Plan Note (Signed)
Avoid triggers Continue rizatriptan as needed

## 2023-04-29 NOTE — Assessment & Plan Note (Signed)
Encourage low-fat diet Continue Imodium OTC as needed

## 2023-04-29 NOTE — Progress Notes (Signed)
Subjective:    Patient ID: Brandi [name], female    DOB: 1985/10/07, 37 y.o.   MRN: [id number]  HPI  Patient presents to clinic today for 71-month follow-up of chronic conditions.  HTN: Her BP today is 112/60.  She is taking lisinopril HCT as prescribed.  There is no ECG on file.  HLD: Her last LDL was 161, triglycerides 283, 10/2021.  She is not taking atorvastatin.  She tries to consume a low-fat diet.  Prediabetes: Her last A1c was 5.6%, 06/2022.  She is not taking any oral diabetic medication at this time.  She does not check her sugars.  Migraines: These occur 1 x month.  Triggered by stress and lack of sleep.  She takes rizatriptan as needed with good relief of symptoms.  She does not follow with neurology.  IBS: She reports mainly diarrhea.  She takes imodium as needed with good relief of symptoms.  Colonoscopy from 12/2020 reviewed.  She follows with GI.  GERD: She is not sure what triggers this.  She denies breakthrough on omeprazole.  Upper GI from 12/2020 reviewed.  Depression: She is not currently taking any medications for this.  She is not currently seeing a therapist.  She denies anxiety, SI/HI.  Chronic pain syndrome: Mainly in her back and knees.  She takes tylenol as needed with good relief of symptoms.  She does not follow with orthopedics or pain management.  Thrombocytosis: Her last platelet count was 431, 12/2022.  She does not follow with hematology.   Review of Systems  Past Medical History:  Diagnosis Date   Abnormal Pap smear of cervix 12/2015   ascus/pos- achd   Allergies    Arthritis    Asthma    Chlamydial cervicitis    Depression    pt has past treatment w/ Zoloft - good symptom control, but worsening IBS symptoms   Genital herpes 02/03/2021   [ ]  valtrex at 34-36wks   Genital HSV    GERD (gastroesophageal reflux disease)    Heart murmur    Hypertension    IBS (irritable bowel syndrome)    Prediabetes 01/19/2022   [ ]  early GTT    Pyelonephritis    Thrombocytosis 07/20/2021   Trichomoniasis     Current Outpatient Medications  Medication Sig Dispense Refill   gabapentin (NEURONTIN) 100 MG capsule Take 2 capsules (200 mg total) by mouth 2 (two) times daily for 7 days. 28 capsule 0   ibuprofen (ADVIL) 600 MG tablet Take 1 tablet (600 mg total) by mouth every 6 (six) hours as needed. (Patient not taking: Reported on 01/16/2023) 30 tablet 0   lisinopril-hydrochlorothiazide (ZESTORETIC) 10-12.5 MG tablet Take 1 tablet by mouth daily. 30 tablet 0   metroNIDAZOLE (FLAGYL) 500 MG tablet Take 1 tablet (500 mg total) by mouth 2 (two) times daily. 14 tablet 0   NIFEdipine (PROCARDIA-XL/NIFEDICAL-XL) 30 MG 24 hr tablet Take 1 tablet (30 mg total) by mouth daily. 30 tablet 0   omeprazole (PRILOSEC) 20 MG capsule Take 1 capsule (20 mg total) by mouth daily. Please schedule an office visit before anymore refills. 30 capsule 0   simethicone (MYLICON) 80 MG chewable tablet Chew 1 tablet (80 mg total) by mouth 2 (two) times daily for 7 days. 30 tablet 0   No current facility-administered medications for this visit.    Allergies  Allergen Reactions   Viberzi [Eluxadoline] Itching   Amoxicillin Nausea And Vomiting and Rash    Family History  Problem Relation  Age of Onset   Diabetes Mother    Hypertension Mother    Asthma Father    Heart disease Father    Heart attack Father    Asthma Sister    Hypertension Sister    ADD / ADHD Son    Cancer Maternal Aunt    Cancer Maternal Uncle    Cancer Maternal Grandmother    Lung cancer Maternal Grandmother    Heart disease Maternal Grandfather    Lung cancer Paternal Grandmother    Heart disease Paternal Grandfather    Birth defects Neg Hx    Stroke Neg Hx     Social History   Socioeconomic History   Marital status: Single    Spouse name: Not on file   Number of children: 1   Years of education: Not on file   Highest education level: Some college, no degree  Occupational  History   Occupation: Horticulturist, commercial    Comment: [employer]  Tobacco Use   Smoking status: Former    Current packs/day: 0.00    Average packs/day: 1 pack/day for 12.0 years (12.0 ttl pk-yrs)    Types: Cigarettes    Start date: 12/07/2009    Quit date: 12/07/2021    Years since quitting: 1.3    Passive exposure: Past   Smokeless tobacco: Never  Vaping Use   Vaping status: Former   Substances: Nicotine, Flavoring  Substance and Sexual Activity   Alcohol use: Not Currently    Alcohol/week: 1.0 standard drink of alcohol    Types: 1 Glasses of wine per week    Comment: rare   Drug use: Not Currently    Frequency: 7.0 times per week    Types: Marijuana   Sexual activity: Not Currently  Other Topics Concern   Not on file  Social History Narrative   Not on file   Social Determinants of Health   Financial Resource Strain: Low Risk  (10/22/2018)   Overall Financial Resource Strain (CARDIA)    Difficulty of Paying Living Expenses: Not very hard  Food Insecurity: No Food Insecurity (07/07/2022)   Hunger Vital Sign    Worried About Running Out of Food in the Last Year: Never true    Ran Out of Food in the Last Year: Never true  Transportation Needs: No Transportation Needs (07/13/2022)   Received from [facility], [facility]   PRAPARE - Transportation    In the past 12 months, has lack of transportation kept you from medical appointments or from getting medications?: No    Lack of Transportation (Non-Medical): No  Physical Activity: Not on file  Stress: Not on file  Social Connections: Not on file  Intimate Partner Violence: Not At Risk (07/07/2022)   Humiliation, Afraid, Rape, and Kick questionnaire    Fear of Current or Ex-Partner: No    Emotionally Abused: No    Physically Abused: No    Sexually Abused: No     Constitutional: Patient reports admit headaches.  Denies fever, malaise, fatigue, or abrupt weight changes.  HEENT: Denies eye pain,  eye redness, ear pain, ringing in the ears, wax buildup, runny nose, nasal congestion, bloody nose, or sore throat. Respiratory: Denies difficulty breathing, shortness of breath, cough or sputum production.   Cardiovascular: Denies chest pain, chest tightness, palpitations or swelling in the hands or feet.  Gastrointestinal: Patient reports intermittent diarrhea.  Denies abdominal pain, bloating, constipation, or blood in the stool.  GU: Denies urgency, frequency, pain with  urination, burning sensation, blood in urine, odor or discharge. Musculoskeletal: Patient reports chronic joint pain.  Denies decrease in range of motion, difficulty with gait, muscle pain or joint swelling.  Skin: Pt reports rash to side of mouth. Denies lesions or ulcercations.  Neurological: Denies dizziness, difficulty with memory, difficulty with speech or problems with balance and coordination.  Psych: Patient has a history of depression.  Denies anxiety, SI/HI.  No other specific complaints in a complete review of systems (except as listed in HPI above).     Objective:   Physical Exam   BP 112/60 (BP Location: Left Arm, Patient Position: Sitting, Cuff Size: Large)   Pulse (!) 106   Temp (!) 96.2 F (35.7 C) (Temporal)   Wt 242 lb (109.8 kg)   LMP 09/27/2022   SpO2 96%   BMI 40.27 kg/m   Wt Readings from Last 3 Encounters:  01/16/23 236 lb (107 kg)  12/11/22 235 lb (106.6 kg)  12/03/22 232 lb 4.8 oz (105.4 kg)    General: Appears her stated age, obese, in NAD. Skin: Warm, dry and intact. Rash noted of corner of mouth.  HEENT: Head: normal shape and size; Eyes: sclera white, no icterus, conjunctiva pink, PERRLA and EOMs intact;  Cardiovascular:Tachycardic with normal rhythm. S1,S2 noted.  No murmur, rubs or gallops noted. No JVD or BLE edema.  Pulmonary/Chest: Normal effort and positive vesicular breath sounds. No respiratory distress. No wheezes, rales or ronchi noted.  Abdomen: Normal bowel  sounds. Musculoskeletal: Strength 5/5 BUE/BLE. No difficulty with gait.  Neurological: Alert and oriented. Cranial nerves II-XII grossly intact. Coordination normal.  Psychiatric: Mood and affect normal. Behavior is normal. Judgment and thought content normal.    BMET    Component Value Date/Time   NA 139 12/11/2022 0834   K 3.5 12/11/2022 0834   CL 110 12/11/2022 0834   CO2 21 (L) 12/11/2022 0834   GLUCOSE 113 (H) 12/11/2022 0834   BUN 6 12/11/2022 0834   CREATININE 0.70 12/11/2022 0834   CREATININE 0.92 08/03/2022 1525   CALCIUM 8.8 (L) 12/11/2022 0834   GFRNONAA >60 12/11/2022 0834   GFRNONAA 112 05/30/2020 1051   GFRAA 130 05/30/2020 1051    Lipid Panel     Component Value Date/Time   CHOL 249 (H) 08/03/2022 1525   TRIG 283 (H) 08/03/2022 1525   HDL 42 (L) 08/03/2022 1525   CHOLHDL 5.9 (H) 08/03/2022 1525   LDLCALC 161 (H) 08/03/2022 1525    CBC    Component Value Date/Time   WBC 8.8 12/03/2022 1129   RBC 4.40 12/03/2022 1129   HGB 12.5 12/03/2022 1129   HGB 13.4 10/02/2022 1201   HCT 38.7 12/03/2022 1129   HCT 40.6 10/02/2022 1201   PLT 431 (H) 12/03/2022 1129   PLT 520 (H) 10/02/2022 1201   MCV 88.0 12/03/2022 1129   MCV 88 10/02/2022 1201   MCH 28.4 12/03/2022 1129   MCHC 32.3 12/03/2022 1129   RDW 15.0 12/03/2022 1129   RDW 13.4 10/02/2022 1201   LYMPHSABS 2.8 07/06/2022 2341   LYMPHSABS 2.1 02/22/2022 1520   MONOABS 1.0 07/06/2022 2341   EOSABS 0.4 07/06/2022 2341   EOSABS 0.1 02/22/2022 1520   BASOSABS 0.0 07/06/2022 2341   BASOSABS 0.0 02/22/2022 1520    Hgb A1C Lab Results  Component Value Date   HGBA1C 5.6 06/27/2022           Assessment & Plan:   Angular cheilitis:  Rx for triamcinolone cream 0.1% twice  daily as needed  RTC in 6 months for your annual exam Nicki Reaper, NP

## 2023-04-29 NOTE — Assessment & Plan Note (Signed)
CBC today.  

## 2023-04-29 NOTE — Assessment & Plan Note (Signed)
Encourage weight loss as this can help reduce joint pain Continue Tylenol as needed 

## 2023-04-29 NOTE — Assessment & Plan Note (Signed)
A1c today Encourage low-carb diet and exercise for weight loss 

## 2023-04-29 NOTE — Assessment & Plan Note (Signed)
Stable off meds Support offered 

## 2023-04-29 NOTE — Assessment & Plan Note (Signed)
Encouraged regular stretching and core strengthening Continue Tylenol as needed

## 2023-04-29 NOTE — Assessment & Plan Note (Signed)
Controlled on lisinopril HCT Reinforced DASH diet and exercise for weight loss C-Met today 

## 2023-04-30 LAB — LIPID PANEL
Cholesterol: 186 mg/dL (ref <200)
HDL: 43 mg/dL — ABNORMAL LOW (ref >=50)
LDL Cholesterol (Calc): 107 mg/dL — ABNORMAL HIGH
Non-HDL Cholesterol (Calc): 143 mg/dL — ABNORMAL HIGH (ref <130)
Total CHOL/HDL Ratio: 4.3 (calc) (ref <5.0)
Triglycerides: 236 mg/dL — ABNORMAL HIGH (ref <150)

## 2023-04-30 LAB — CBC
HCT: 39.1 % (ref 35.0–45.0)
Hemoglobin: 12.4 g/dL (ref 11.7–15.5)
MCH: 25.9 pg — ABNORMAL LOW (ref 27.0–33.0)
MCHC: 31.7 g/dL — ABNORMAL LOW (ref 32.0–36.0)
MCV: 81.6 fL (ref 80.0–100.0)
MPV: 11.5 fL (ref 7.5–12.5)
Platelets: 410 10*3/uL — ABNORMAL HIGH (ref 140–400)
RBC: 4.79 10*6/uL (ref 3.80–5.10)
RDW: 17.5 % — ABNORMAL HIGH (ref 11.0–15.0)
WBC: 8.4 10*3/uL (ref 3.8–10.8)

## 2023-04-30 LAB — COMPLETE METABOLIC PANEL WITH GFR
AG Ratio: 1.5 (calc) (ref 1.0–2.5)
ALT: 19 U/L (ref 6–29)
AST: 16 U/L (ref 10–30)
Albumin: 4 g/dL (ref 3.6–5.1)
Alkaline phosphatase (APISO): 78 U/L (ref 31–125)
BUN: 7 mg/dL (ref 7–25)
CO2: 22 mmol/L (ref 20–32)
Calcium: 9.4 mg/dL (ref 8.6–10.2)
Chloride: 104 mmol/L (ref 98–110)
Creat: 0.67 mg/dL (ref 0.50–0.97)
Globulin: 2.6 g/dL (ref 1.9–3.7)
Glucose, Bld: 145 mg/dL — ABNORMAL HIGH (ref 65–139)
Potassium: 4.2 mmol/L (ref 3.5–5.3)
Sodium: 137 mmol/L (ref 135–146)
Total Bilirubin: 0.3 mg/dL (ref 0.2–1.2)
Total Protein: 6.6 g/dL (ref 6.1–8.1)
eGFR: 116 mL/min/{1.73_m2} (ref >=60)

## 2023-04-30 LAB — HEMOGLOBIN A1C
Hgb A1c MFr Bld: 6.2 %{Hb} — ABNORMAL HIGH (ref <5.7)
Mean Plasma Glucose: 131 mg/dL
eAG (mmol/L): 7.3 mmol/L

## 2023-05-09 ENCOUNTER — Encounter: Payer: Self-pay | Admitting: Internal Medicine

## 2023-05-13 ENCOUNTER — Telehealth (HOSPITAL_BASED_OUTPATIENT_CLINIC_OR_DEPARTMENT_OTHER): Payer: Self-pay | Admitting: Pulmonary Disease

## 2023-05-13 DIAGNOSIS — G4733 Obstructive sleep apnea (adult) (pediatric): Secondary | ICD-10-CM | POA: Diagnosis not present

## 2023-05-13 NOTE — Telephone Encounter (Signed)
noted 

## 2023-05-13 NOTE — Telephone Encounter (Signed)
HST showed mod OSA with AHI 16/ hr , events mainly during REM sleep Significant hypoxia 108 mins < 88% Suggest sleep consult

## 2023-05-23 ENCOUNTER — Encounter: Payer: Self-pay | Admitting: Internal Medicine

## 2023-05-23 ENCOUNTER — Ambulatory Visit: Payer: Medicaid Other | Admitting: Internal Medicine

## 2023-05-23 VITALS — BP 136/86 | HR 104 | Temp 95.5°F | Ht 66.0 in | Wt 237.0 lb

## 2023-05-23 DIAGNOSIS — R7303 Prediabetes: Secondary | ICD-10-CM

## 2023-05-23 DIAGNOSIS — E782 Mixed hyperlipidemia: Secondary | ICD-10-CM

## 2023-05-23 DIAGNOSIS — I10 Essential (primary) hypertension: Secondary | ICD-10-CM

## 2023-05-23 DIAGNOSIS — K130 Diseases of lips: Secondary | ICD-10-CM | POA: Diagnosis not present

## 2023-05-23 DIAGNOSIS — Z6838 Body mass index (BMI) 38.0-38.9, adult: Secondary | ICD-10-CM

## 2023-05-23 MED ORDER — CLOTRIMAZOLE-BETAMETHASONE 1-0.05 % EX CREA: TOPICAL_CREAM | Freq: Two times a day (BID) | CUTANEOUS | 0 refills | Status: AC

## 2023-05-23 MED ORDER — SEMAGLUTIDE-WEIGHT MANAGEMENT 0.25 MG/0.5ML ~~LOC~~ SOAJ: 0.2500 mg | SUBCUTANEOUS | 0 refills | Status: DC

## 2023-05-23 NOTE — Patient Instructions (Signed)
Calorie Counting for Weight Loss Calories are units of energy. Your body needs a certain number of calories from food to keep going throughout the day. When you eat or drink more calories than your body needs, your body stores the extra calories mostly as fat. When you eat or drink fewer calories than your body needs, your body burns fat to get the energy it needs. Calorie counting means keeping track of how many calories you eat and drink each day. Calorie counting can be helpful if you need to lose weight. If you eat fewer calories than your body needs, you should lose weight. Ask your health care provider what a healthy weight is for you. For calorie counting to work, you will need to eat the right number of calories each day to lose a healthy amount of weight per week. A dietitian can help you figure out how many calories you need in a day and will suggest ways to reach your calorie goal. A healthy amount of weight to lose each week is usually 1-2 lb (0.5-0.9 kg). This usually means that your daily calorie intake should be reduced by 500-750 calories. Eating 1,200-1,500 calories a day can help most women lose weight. Eating 1,500-1,800 calories a day can help most men lose weight. What do I need to know about calorie counting? Work with your health care provider or dietitian to determine how many calories you should get each day. To meet your daily calorie goal, you will need to: Find out how many calories are in each food that you would like to eat. Try to do this before you eat. Decide how much of the food you plan to eat. Keep a food log. Do this by writing down what you ate and how many calories it had. To successfully lose weight, it is important to balance calorie counting with a healthy lifestyle that includes regular activity. Where do I find calorie information?  The number of calories in a food can be found on a Nutrition Facts label. If a food does not have a Nutrition Facts label, try  to look up the calories online or ask your dietitian for help. Remember that calories are listed per serving. If you choose to have more than one serving of a food, you will have to multiply the calories per serving by the number of servings you plan to eat. For example, the label on a package of bread might say that a serving size is 1 slice and that there are 90 calories in a serving. If you eat 1 slice, you will have eaten 90 calories. If you eat 2 slices, you will have eaten 180 calories. How do I keep a food log? After each time that you eat, record the following in your food log as soon as possible: What you ate. Be sure to include toppings, sauces, and other extras on the food. How much you ate. This can be measured in cups, ounces, or number of items. How many calories were in each food and drink. The total number of calories in the food you ate. Keep your food log near you, such as in a pocket-sized notebook or on an app or website on your mobile phone. Some programs will calculate calories for you and show you how many calories you have left to meet your daily goal. What are some portion-control tips? Know how many calories are in a serving. This will help you know how many servings you can have of a certain  food. Use a measuring cup to measure serving sizes. You could also try weighing out portions on a kitchen scale. With time, you will be able to estimate serving sizes for some foods. Take time to put servings of different foods on your favorite plates or in your favorite bowls and cups so you know what a serving looks like. Try not to eat straight from a food's packaging, such as from a bag or box. Eating straight from the package makes it hard to see how much you are eating and can lead to overeating. Put the amount you would like to eat in a cup or on a plate to make sure you are eating the right portion. Use smaller plates, glasses, and bowls for smaller portions and to prevent  overeating. Try not to multitask. For example, avoid watching TV or using your computer while eating. If it is time to eat, sit down at a table and enjoy your food. This will help you recognize when you are full. It will also help you be more mindful of what and how much you are eating. What are tips for following this plan? Reading food labels Check the calorie count compared with the serving size. The serving size may be smaller than what you are used to eating. Check the source of the calories. Try to choose foods that are high in protein, fiber, and vitamins, and low in saturated fat, trans fat, and sodium. Shopping Read nutrition labels while you shop. This will help you make healthy decisions about which foods to buy. Pay attention to nutrition labels for low-fat or fat-free foods. These foods sometimes have the same number of calories or more calories than the full-fat versions. They also often have added sugar, starch, or salt to make up for flavor that was removed with the fat. Make a grocery list of lower-calorie foods and stick to it. Cooking Try to cook your favorite foods in a healthier way. For example, try baking instead of frying. Use low-fat dairy products. Meal planning Use more fruits and vegetables. One-half of your plate should be fruits and vegetables. Include lean proteins, such as chicken, [place], and fish. Lifestyle Each week, aim to do one of the following: 150 minutes of moderate exercise, such as walking. 75 minutes of vigorous exercise, such as running. General information Know how many calories are in the foods you eat most often. This will help you calculate calorie counts faster. Find a way of tracking calories that works for you. Get creative. Try different apps or programs if writing down calories does not work for you. What foods should I eat?  Eat nutritious foods. It is better to have a nutritious, high-calorie food, such as an avocado, than a food with  few nutrients, such as a bag of potato chips. Use your calories on foods and drinks that will fill you up and will not leave you hungry soon after eating. Examples of foods that fill you up are nuts and nut butters, vegetables, lean proteins, and high-fiber foods such as whole grains. High-fiber foods are foods with more than 5 g of fiber per serving. Pay attention to calories in drinks. Low-calorie drinks include water and unsweetened drinks. The items listed above may not be a complete list of foods and beverages you can eat. Contact a dietitian for more information. What foods should I limit? Limit foods or drinks that are not good sources of vitamins, minerals, or protein or that are high in unhealthy fats. These  include: Candy. Other sweets. Sodas, specialty coffee drinks, alcohol, and juice. The items listed above may not be a complete list of foods and beverages you should avoid. Contact a dietitian for more information. How do I count calories when eating out? Pay attention to portions. Often, portions are much larger when eating out. Try these tips to keep portions smaller: Consider sharing a meal instead of getting your own. If you get your own meal, eat only half of it. Before you start eating, ask for a container and put half of your meal into it. When available, consider ordering smaller portions from the menu instead of full portions. Pay attention to your food and drink choices. Knowing the way food is cooked and what is included with the meal can help you eat fewer calories. If calories are listed on the menu, choose the lower-calorie options. Choose dishes that include vegetables, fruits, whole grains, low-fat dairy products, and lean proteins. Choose items that are boiled, broiled, grilled, or steamed. Avoid items that are buttered, battered, fried, or served with cream sauce. Items labeled as crispy are usually fried, unless stated otherwise. Choose water, low-fat milk,  unsweetened iced tea, or other drinks without added sugar. If you want an alcoholic beverage, choose a lower-calorie option, such as a glass of wine or light beer. Ask for dressings, sauces, and syrups on the side. These are usually high in calories, so you should limit the amount you eat. If you want a salad, choose a garden salad and ask for grilled meats. Avoid extra toppings such as bacon, cheese, or fried items. Ask for the dressing on the side, or ask for olive oil and vinegar or lemon to use as dressing. Estimate how many servings of a food you are given. Knowing serving sizes will help you be aware of how much food you are eating at restaurants. Where to find more information Centers for Disease Control and Prevention: FootballExhibition.com.br U.[place]. Department of Agriculture: WrestlingReporter.dk Summary Calorie counting means keeping track of how many calories you eat and drink each day. If you eat fewer calories than your body needs, you should lose weight. A healthy amount of weight to lose per week is usually 1-2 lb (0.5-0.9 kg). This usually means reducing your daily calorie intake by 500-750 calories. The number of calories in a food can be found on a Nutrition Facts label. If a food does not have a Nutrition Facts label, try to look up the calories online or ask your dietitian for help. Use smaller plates, glasses, and bowls for smaller portions and to prevent overeating. Use your calories on foods and drinks that will fill you up and not leave you hungry shortly after a meal. This information is not intended to replace advice given to you by your health care provider. Make sure you discuss any questions you have with your health care provider. Document Revised: 10/01/2019 Document Reviewed: 10/01/2019 Elsevier Patient Education  2023 [employer].

## 2023-05-23 NOTE — Progress Notes (Signed)
Subjective:    Patient ID: Brandi [name], female    DOB: 15-Dec-1985, 37 y.o.   MRN: [id number]  HPI  Discussed the use of AI scribe software for clinical note transcription with the patient, who gave verbal consent to proceed.  History of Present Illness   The patient, previously diagnosed with angular colitis, was prescribed Triamcinolone cream. However, she reported that the cream was only partially effective and caused lip peeling and additional cuts. She also admitted to not picking up the prescribed clotrimazole. She has been using Vaseline to manage the symptoms, which she reports helps to some extent, particularly with morning discomfort due to dry mouth.  In addition to the angular colitis, the patient expressed concerns about her struggle with weight loss. Despite some recent weight loss, she feels she is not making significant progress. She has been trying to eat healthier and walk for at least 30 minutes daily over the last 3 months, but finds it challenging to go to the gym due to childcare responsibilities. She expressed interest in trying medication to aid in weight loss, acknowledging that weight loss could help manage her blood pressure and pre-diabetic condition. Her weight today is 237 lbs with a BMI of 38.25.       Review of Systems     Past Medical History:  Diagnosis Date   Abnormal Pap smear of cervix 12/2015   ascus/pos- achd   Allergies    Arthritis    Asthma    Chlamydial cervicitis    Depression    pt has past treatment w/ Zoloft - good symptom control, but worsening IBS symptoms   Genital herpes 02/03/2021   [ ]  valtrex at 34-36wks   Genital HSV    GERD (gastroesophageal reflux disease)    Heart murmur    Hypertension    IBS (irritable bowel syndrome)    Prediabetes 01/19/2022   [ ]  early GTT   Pyelonephritis    Thrombocytosis 07/20/2021   Trichomoniasis     Current Outpatient Medications  Medication Sig Dispense Refill   ibuprofen (ADVIL)  600 MG tablet Take 1 tablet (600 mg total) by mouth every 6 (six) hours as needed. 30 tablet 0   lisinopril-hydrochlorothiazide (ZESTORETIC) 10-12.5 MG tablet Take 1 tablet by mouth daily. 30 tablet 0   omeprazole (PRILOSEC) 20 MG capsule Take 1 capsule (20 mg total) by mouth daily. Please schedule an office visit before anymore refills. 30 capsule 0   triamcinolone cream (KENALOG) 0.1 % Apply 1 Application topically 2 (two) times daily. 30 g 0   No current facility-administered medications for this visit.    Allergies  Allergen Reactions   Viberzi [Eluxadoline] Itching   Amoxicillin Nausea And Vomiting and Rash    Family History  Problem Relation Age of Onset   Diabetes Mother    Hypertension Mother    Asthma Father    Heart disease Father    Heart attack Father    Asthma Sister    Hypertension Sister    ADD / ADHD Son    Cancer Maternal Aunt    Cancer Maternal Uncle    Cancer Maternal Grandmother    Lung cancer Maternal Grandmother    Heart disease Maternal Grandfather    Lung cancer Paternal Grandmother    Heart disease Paternal Grandfather    Birth defects Neg Hx    Stroke Neg Hx     Social History   Socioeconomic History   Marital status: Single    Spouse  name: Not on file   Number of children: 1   Years of education: Not on file   Highest education level: Some college, no degree  Occupational History   Occupation: Debt Collection    Comment: [employer]  Tobacco Use   Smoking status: Former    Current packs/day: 0.00    Average packs/day: 1 pack/day for 12.0 years (12.0 ttl pk-yrs)    Types: Cigarettes    Start date: 12/07/2009    Quit date: 12/07/2021    Years since quitting: 1.4    Passive exposure: Past   Smokeless tobacco: Never  Vaping Use   Vaping status: Former   Substances: Nicotine, Flavoring  Substance and Sexual Activity   Alcohol use: Not Currently    Alcohol/week: 1.0 standard drink of alcohol    Types: 1 Glasses of wine per week    Comment:  rare   Drug use: Not Currently    Frequency: 7.0 times per week    Types: Marijuana   Sexual activity: Not Currently  Other Topics Concern   Not on file  Social History Narrative   Not on file   Social Determinants of Health   Financial Resource Strain: Low Risk  (10/22/2018)   Overall Financial Resource Strain (CARDIA)    Difficulty of Paying Living Expenses: Not very hard  Food Insecurity: No Food Insecurity (07/07/2022)   Hunger Vital Sign    Worried About Running Out of Food in the Last Year: Never true    Ran Out of Food in the Last Year: Never true  Transportation Needs: No Transportation Needs (07/13/2022)   Received from [facility], [facility]   PRAPARE - Transportation    In the past 12 months, has lack of transportation kept you from medical appointments or from getting medications?: No    Lack of Transportation (Non-Medical): No  Physical Activity: Not on file  Stress: Not on file  Social Connections: Not on file  Intimate Partner Violence: Not At Risk (07/07/2022)   Humiliation, Afraid, Rape, and Kick questionnaire    Fear of Current or Ex-Partner: No    Emotionally Abused: No    Physically Abused: No    Sexually Abused: No     Constitutional: Patient reports intermittent headaches.  Denies fever, malaise, fatigue, or abrupt weight changes.  HEENT: Patient reports irritation at corners of mouth.  Denies eye pain, eye redness, ear pain, ringing in the ears, wax buildup, runny nose, nasal congestion, bloody nose, or sore throat. Respiratory: Denies difficulty breathing, shortness of breath, cough or sputum production.   Cardiovascular: Denies chest pain, chest tightness, palpitations or swelling in the hands or feet.  Gastrointestinal: Patient reports intermittent diarrhea.  Denies abdominal pain, bloating, constipation, or blood in the stool.  GU: Denies urgency, frequency, pain with urination, burning sensation, blood in urine,  odor or discharge. Musculoskeletal: Patient reports chronic joint pain.  Denies decrease in range of motion, difficulty with gait, muscle pain or joint swelling.  Skin: Denies redness, rashes, lesions or ulcercations.  Neurological: Denies dizziness, difficulty with memory, difficulty with speech or problems with balance and coordination.  Psych: Patient has a history of depression.  Denies anxiety, SI/HI.  No other specific complaints in a complete review of systems (except as listed in HPI above).  Objective:   Physical Exam  BP 136/86 (BP Location: Right Arm, Patient Position: Sitting, Cuff Size: Large)   Pulse (!) 104   Temp (!) 95.5 F (35.3 C) (Temporal)  Ht 5\' 6"  (1.676 m)   Wt 237 lb (107.5 kg)   LMP 09/27/2022   SpO2 97%   BMI 38.25 kg/m   Wt Readings from Last 3 Encounters:  04/29/23 242 lb (109.8 kg)  01/16/23 236 lb (107 kg)  12/11/22 235 lb (106.6 kg)    General: Appears her stated age, obese, in NAD. Skin: Warm, dry and intact. Cracking noted of bilateral corners of mouth. Cardiovascular: Tachycardic with normal rhythm. S1,S2 noted.  No murmur, rubs or gallops noted.  Pulmonary/Chest: Normal effort and positive vesicular breath sounds. No respiratory distress. No wheezes, rales or ronchi noted.  Musculoskeletal: No difficulty with gait.  Neurological: Alert and oriented. Coordination normal.  Psychiatric: Mood and affect normal. Behavior is normal. Judgment and thought content normal.    BMET    Component Value Date/Time   NA 137 04/29/2023 1332   K 4.2 04/29/2023 1332   CL 104 04/29/2023 1332   CO2 22 04/29/2023 1332   GLUCOSE 145 (H) 04/29/2023 1332   BUN 7 04/29/2023 1332   CREATININE 0.67 04/29/2023 1332   CALCIUM 9.4 04/29/2023 1332   GFRNONAA >60 12/11/2022 0834   GFRNONAA 112 05/30/2020 1051   GFRAA 130 05/30/2020 1051    Lipid Panel     Component Value Date/Time   CHOL 186 04/29/2023 1332   TRIG 236 (H) 04/29/2023 1332   HDL 43 (L)  04/29/2023 1332   CHOLHDL 4.3 04/29/2023 1332   LDLCALC 107 (H) 04/29/2023 1332    CBC    Component Value Date/Time   WBC 8.4 04/29/2023 1332   RBC 4.79 04/29/2023 1332   HGB 12.4 04/29/2023 1332   HGB 13.4 10/02/2022 1201   HCT 39.1 04/29/2023 1332   HCT 40.6 10/02/2022 1201   PLT 410 (H) 04/29/2023 1332   PLT 520 (H) 10/02/2022 1201   MCV 81.6 04/29/2023 1332   MCV 88 10/02/2022 1201   MCH 25.9 (L) 04/29/2023 1332   MCHC 31.7 (L) 04/29/2023 1332   RDW 17.5 (H) 04/29/2023 1332   RDW 13.4 10/02/2022 1201   LYMPHSABS 2.8 07/06/2022 2341   LYMPHSABS 2.1 02/22/2022 1520   MONOABS 1.0 07/06/2022 2341   EOSABS 0.4 07/06/2022 2341   EOSABS 0.1 02/22/2022 1520   BASOSABS 0.0 07/06/2022 2341   BASOSABS 0.0 02/22/2022 1520    Hgb A1C Lab Results  Component Value Date   HGBA1C 6.2 (H) 04/29/2023            Assessment & Plan:   Assessment and Plan    Angular Cheilitis Persistent despite treatment with Triamcinolone cream. Patient reported additional cut and peeling lips, possibly due to inadvertent application on lips. -Discontinue Triamcinolone cream. -Start Lotrisone cream twice daily, applied only to the affected areas. -Continue use of petroleum jelly.  Obesity Patient reports difficulty with weight loss despite efforts with diet and exercise. Expressed interest in pharmacological assistance for weight loss. -Start Wegovy 0.25 mg weekly for four weeks. -Advise high protein, low carbohydrate diet with emphasis on lean proteins and green vegetables. -Encourage continuation of daily exercise. -Message doctor after completion of first prescription for dose adjustment.        RTC in 5 months for annual exam Nicki Reaper, NP

## 2023-05-23 NOTE — Assessment & Plan Note (Signed)
May improve with weight loss, we will try [facility]

## 2023-05-23 NOTE — Assessment & Plan Note (Signed)
Will try Wegovy.

## 2023-06-16 ENCOUNTER — Other Ambulatory Visit: Payer: Self-pay | Admitting: Internal Medicine

## 2023-06-17 NOTE — Telephone Encounter (Signed)
Requested Prescriptions  Pending Prescriptions Disp Refills   lisinopril-hydrochlorothiazide (ZESTORETIC) 10-12.5 MG tablet [Pharmacy Med Name: LISINOPRIL-HCTZ 10/12.5MG  TABLETS] 90 tablet 1    Sig: TAKE 1 TABLET BY MOUTH DAILY     Cardiovascular:  ACEI + Diuretic Combos Passed - 06/16/2023  8:25 AM      Passed - Na in normal range and within 180 days    Sodium  Date Value Ref Range Status  04/29/2023 137 135 - 146 mmol/L Final         Passed - K in normal range and within 180 days    Potassium  Date Value Ref Range Status  04/29/2023 4.2 3.5 - 5.3 mmol/L Final         Passed - Cr in normal range and within 180 days    Creat  Date Value Ref Range Status  04/29/2023 0.67 0.50 - 0.97 mg/dL Final         Passed - eGFR is 30 or above and within 180 days    GFR, Est African American  Date Value Ref Range Status  05/30/2020 130 > OR = 60 mL/min/1.60m2 Final   GFR, Est Non African American  Date Value Ref Range Status  05/30/2020 112 > OR = 60 mL/min/1.25m2 Final   GFR, Estimated  Date Value Ref Range Status  12/11/2022 >60 >60 mL/min Final    Comment:    (NOTE) Calculated using the CKD-EPI Creatinine Equation (2021)    eGFR  Date Value Ref Range Status  04/29/2023 116 > OR = 60 mL/min/1.78m2 Final         Passed - Patient is not pregnant      Passed - Last BP in normal range    BP Readings from Last 1 Encounters:  05/23/23 136/86         Passed - Valid encounter within last 6 months    Recent Outpatient Visits           3 weeks ago Angular cheilitis   [place] [facility] [place], Salvadore Oxford, NP   1 month ago Class 3 severe obesity due to excess calories with serious comorbidity and body mass index (BMI) of 40.0 to 44.9 in adult [facility])   [place] [facility] [place], Salvadore Oxford, NP   10 months ago Encounter for general adult medical examination with abnormal findings   [place] [facility] [place], Salvadore Oxford, NP    1 year ago Prediabetes   [place] [facility] [place], Salvadore Oxford, NP   1 year ago Encounter for general adult medical examination with abnormal findings   [place] [facility] [place], Salvadore Oxford, NP       Future Appointments             In 3 months Baity, Salvadore Oxford, NP [facility] [address], [facility]

## 2023-08-05 ENCOUNTER — Encounter: Payer: Self-pay | Admitting: Internal Medicine

## 2023-08-15 ENCOUNTER — Other Ambulatory Visit: Payer: Self-pay | Admitting: Internal Medicine

## 2023-08-15 DIAGNOSIS — K219 Gastro-esophageal reflux disease without esophagitis: Secondary | ICD-10-CM

## 2023-08-15 NOTE — Telephone Encounter (Signed)
Requested Prescriptions  Pending Prescriptions Disp Refills   omeprazole (PRILOSEC) 20 MG capsule [Pharmacy Med Name: OMEPRAZOLE 20MG  CAPSULES] 90 capsule 0    Sig: TAKE 1 CAPSULE(20 MG) BY MOUTH DAILY     Gastroenterology: Proton Pump Inhibitors Passed - 08/15/2023 11:15 AM      Passed - Valid encounter within last 12 months    Recent Outpatient Visits           2 months ago Angular cheilitis   [employer] [facility] [place], [place] W, NP   3 months ago Class 3 severe obesity due to excess calories with serious comorbidity and body mass index (BMI) of 40.0 to 44.9 in adult [facility])   [place] [facility] [place], Salvadore Oxford, NP   1 year ago Encounter for general adult medical examination with abnormal findings   [employer] [facility] [place], Salvadore Oxford, NP   1 year ago Prediabetes   [place] [facility] [place], Salvadore Oxford, NP   2 years ago Encounter for general adult medical examination with abnormal findings   [place] [facility] [place], Salvadore Oxford, NP       Future Appointments             In 1 month Baity, Salvadore Oxford, NP  [facility], [facility]

## 2023-09-27 ENCOUNTER — Other Ambulatory Visit: Payer: Self-pay

## 2023-09-27 ENCOUNTER — Encounter: Payer: Self-pay | Admitting: Internal Medicine

## 2023-09-27 MED ORDER — LISINOPRIL-HYDROCHLOROTHIAZIDE 10-12.5 MG PO TABS: 1.0000 | ORAL_TABLET | Freq: Every day | ORAL | 1 refills | Status: DC

## 2023-10-07 ENCOUNTER — Encounter: Payer: Self-pay | Admitting: Internal Medicine

## 2023-10-07 NOTE — Progress Notes (Deleted)
 Subjective:    Patient ID: Brandi [name], female    DOB: 24-Dec-1985, 38 y.o.   MRN: [id number]  HPI  Patient presents to clinic today for annual exam.  Flu: never Tetanus: 08/2022 COVID: Pfizer x2 Pap smear: 02/2022, abnormal (follows with GYN) Dentist: as needed  Diet: She does eat meat. She consumes fruits and veggies. She does eat some fruits and veggies. She drinks mostly soda and water. Exercise: None   Review of Systems     Past Medical History:  Diagnosis Date   Abnormal Pap smear of cervix 12/2015   ascus/pos- achd   Allergies    Arthritis    Asthma    Chlamydial cervicitis    Depression    pt has past treatment w/ Zoloft - good symptom control, but worsening IBS symptoms   Genital herpes 02/03/2021   [ ]  valtrex at 34-36wks   Genital HSV    GERD (gastroesophageal reflux disease)    Heart murmur    Hypertension    IBS (irritable bowel syndrome)    Prediabetes 01/19/2022   [ ]  early GTT   Pyelonephritis    Thrombocytosis 07/20/2021   Trichomoniasis     Current Outpatient Medications  Medication Sig Dispense Refill   clotrimazole-betamethasone (LOTRISONE) cream Apply topically 2 (two) times daily. Apply 1-2 times a day for worsening flare dry skin dermatitis of toes/feet, may re-use daily up to 1 week as needed. 30 g 0   ibuprofen (ADVIL) 600 MG tablet Take 1 tablet (600 mg total) by mouth every 6 (six) hours as needed. 30 tablet 0   lisinopril-hydrochlorothiazide (ZESTORETIC) 10-12.5 MG tablet Take 1 tablet by mouth daily. 90 tablet 1   omeprazole (PRILOSEC) 20 MG capsule TAKE 1 CAPSULE(20 MG) BY MOUTH DAILY 90 capsule 0   Semaglutide-Weight Management 0.25 MG/0.5ML SOAJ Inject 0.25 mg into the skin once a week. 2 mL 0   No current facility-administered medications for this visit.    Allergies  Allergen Reactions   Viberzi [Eluxadoline] Itching   Amoxicillin Nausea And Vomiting and Rash    Family History  Problem Relation Age of Onset    Diabetes Mother    Hypertension Mother    Asthma Father    Heart disease Father    Heart attack Father    Asthma Sister    Hypertension Sister    ADD / ADHD Son    Cancer Maternal Aunt    Cancer Maternal Uncle    Cancer Maternal Grandmother    Lung cancer Maternal Grandmother    Heart disease Maternal Grandfather    Lung cancer Paternal Grandmother    Heart disease Paternal Grandfather    Birth defects Neg Hx    Stroke Neg Hx     Social History   Socioeconomic History   Marital status: Single    Spouse name: Not on file   Number of children: 1   Years of education: Not on file   Highest education level: Some college, no degree  Occupational History   Occupation: Horticulturist, commercial    Comment: [employer]  Tobacco Use   Smoking status: Every Day    Current packs/day: 0.00    Average packs/day: 1 pack/day for 12.0 years (12.0 ttl pk-yrs)    Types: Cigarettes    Start date: 12/07/2009    Last attempt to quit: 12/07/2021    Years since quitting: 1.8    Passive exposure: Past   Smokeless tobacco: Never  Vaping Use   Vaping  status: Former   Substances: Nicotine, Flavoring  Substance and Sexual Activity   Alcohol use: Not Currently    Alcohol/week: 1.0 standard drink of alcohol    Types: 1 Glasses of wine per week    Comment: rare   Drug use: Not Currently    Frequency: 7.0 times per week    Types: Marijuana   Sexual activity: Not Currently  Other Topics Concern   Not on file  Social History Narrative   Not on file   Social Drivers of Health   Financial Resource Strain: Low Risk  (10/22/2018)   Overall Financial Resource Strain (CARDIA)    Difficulty of Paying Living Expenses: Not very hard  Food Insecurity: No Food Insecurity (07/07/2022)   Hunger Vital Sign    Worried About Running Out of Food in the Last Year: Never true    Ran Out of Food in the Last Year: Never true  Transportation Needs: No Transportation Needs (07/13/2022)   [facility], [facility]   PRAPARE - Transportation    In the past 12 months, has lack of transportation kept you from medical appointments or from getting medications?: No    Lack of Transportation (Non-Medical): No  Physical Activity: Not on file  Stress: Not on file  Social Connections: Not on file  Intimate Partner Violence: Not At Risk (07/07/2022)   Humiliation, Afraid, Rape, and Kick questionnaire    Fear of Current or Ex-Partner: No    Emotionally Abused: No    Physically Abused: No    Sexually Abused: No     Constitutional: Patient reports intermittent headaches.  Denies fever, malaise, fatigue, or abrupt weight changes.  HEENT: Denies eye pain, eye redness, ear pain, ringing in the ears, wax buildup, runny nose, nasal congestion, bloody nose, or sore throat. Respiratory: Denies difficulty breathing, shortness of breath, cough or sputum production.   Cardiovascular: Denies chest pain, chest tightness, palpitations or swelling in the hands or feet.  Gastrointestinal: Patient reports intermittent diarrhea.  Denies abdominal pain, bloating, constipation, or blood in the stool.  GU: Denies urgency, frequency, pain with urination, burning sensation, blood in urine, odor or discharge. Musculoskeletal: Patient reports chronic joint pain.  Denies decrease in range of motion, difficulty with gait,  or joint swelling.  Skin: Denies redness, rashes, lesions or ulcercations.  Neurological: Denies dizziness, difficulty with memory, difficulty with speech or problems with balance and coordination.  Psych: Patient has a history of depression.  Denies anxiety, SI/HI.  No other specific complaints in a complete review of systems (except as listed in HPI above).  Objective:   Physical Exam   LMP 09/27/2022    Wt Readings from Last 3 Encounters:  05/23/23 237 lb (107.5 kg)  04/29/23 242 lb (109.8 kg)  01/16/23 236 lb (107 kg)    General: Appears her stated age, obese, in  NAD. Skin: Warm, dry and intact.  HEENT: Head: normal shape and size; Eyes: sclera white, no icterus, conjunctiva pink, PERRLA and EOMs intact;  Neck:  Neck supple, trachea midline. No masses, lumps or thyromegaly present.  Cardiovascular:Tachycardic with normal rhythm. S1,S2 noted.  No murmur, rubs or gallops noted. No JVD or BLE edema. No carotid bruits noted. Pulmonary/Chest: Normal effort and positive vesicular breath sounds. No respiratory distress. No wheezes, rales or ronchi noted.  Abdomen: Normal bowel sounds.  Musculoskeletal: Strength 5/5 BUE/BLE. No difficulty with gait.  Neurological: Alert and oriented. Cranial nerves II-XII grossly intact. Coordination normal.  Psychiatric: Mood and affect normal. Behavior is normal. Judgment and thought content normal.    BMET    Component Value Date/Time   NA 137 04/29/2023 1332   K 4.2 04/29/2023 1332   CL 104 04/29/2023 1332   CO2 22 04/29/2023 1332   GLUCOSE 145 (H) 04/29/2023 1332   BUN 7 04/29/2023 1332   CREATININE 0.67 04/29/2023 1332   CALCIUM 9.4 04/29/2023 1332   GFRNONAA >60 12/11/2022 0834   GFRNONAA 112 05/30/2020 1051   GFRAA 130 05/30/2020 1051    Lipid Panel     Component Value Date/Time   CHOL 186 04/29/2023 1332   TRIG 236 (H) 04/29/2023 1332   HDL 43 (L) 04/29/2023 1332   CHOLHDL 4.3 04/29/2023 1332   LDLCALC 107 (H) 04/29/2023 1332    CBC    Component Value Date/Time   WBC 8.4 04/29/2023 1332   RBC 4.79 04/29/2023 1332   HGB 12.4 04/29/2023 1332   HGB 13.4 10/02/2022 1201   HCT 39.1 04/29/2023 1332   HCT 40.6 10/02/2022 1201   PLT 410 (H) 04/29/2023 1332   PLT 520 (H) 10/02/2022 1201   MCV 81.6 04/29/2023 1332   MCV 88 10/02/2022 1201   MCH 25.9 (L) 04/29/2023 1332   MCHC 31.7 (L) 04/29/2023 1332   RDW 17.5 (H) 04/29/2023 1332   RDW 13.4 10/02/2022 1201   LYMPHSABS 2.8 07/06/2022 2341   LYMPHSABS 2.1 02/22/2022 1520   MONOABS 1.0 07/06/2022 2341   EOSABS 0.4 07/06/2022 2341   EOSABS 0.1  02/22/2022 1520   BASOSABS 0.0 07/06/2022 2341   BASOSABS 0.0 02/22/2022 1520    Hgb A1C Lab Results  Component Value Date   HGBA1C 6.2 (H) 04/29/2023           Assessment & Plan:    Preventative Health Maintenance:  She declines flu shot today Tetanus UTD Encouraged her get her COVID booster She will have her Pap repeated by GYN Encouraged her to consume a balanced diet and exercise regimen Advised her to see an eye doctor and dentist annually We will check CBC, c-Met lipid profile and A1c today  RTC in 6 months, follow-up chronic conditions Nicki Reaper, NP

## 2023-10-08 ENCOUNTER — Other Ambulatory Visit: Payer: Self-pay | Admitting: Internal Medicine

## 2023-10-08 DIAGNOSIS — K219 Gastro-esophageal reflux disease without esophagitis: Secondary | ICD-10-CM

## 2023-10-09 MED ORDER — OMEPRAZOLE 20 MG PO CPDR: 20.0000 mg | DELAYED_RELEASE_CAPSULE | Freq: Every day | ORAL | 0 refills | Status: DC

## 2023-10-15 ENCOUNTER — Encounter: Payer: Medicaid Other | Admitting: Internal Medicine

## 2023-12-11 ENCOUNTER — Encounter: Payer: Self-pay | Admitting: Internal Medicine

## 2023-12-12 ENCOUNTER — Ambulatory Visit: Admitting: Internal Medicine

## 2023-12-12 ENCOUNTER — Encounter: Payer: Self-pay | Admitting: Internal Medicine

## 2023-12-12 VITALS — BP 128/78 | HR 111 | Ht 66.0 in | Wt 241.4 lb

## 2023-12-12 DIAGNOSIS — J4521 Mild intermittent asthma with (acute) exacerbation: Secondary | ICD-10-CM | POA: Diagnosis not present

## 2023-12-12 MED ORDER — PREDNISONE 10 MG PO TABS: ORAL_TABLET | ORAL | 0 refills | Status: DC

## 2023-12-12 MED ORDER — PROMETHAZINE-DM 6.25-15 MG/5ML PO SYRP: 5.0000 mL | ORAL_SOLUTION | Freq: Four times a day (QID) | ORAL | 0 refills | Status: DC | PRN

## 2023-12-12 MED ORDER — ALBUTEROL SULFATE HFA 108 (90 BASE) MCG/ACT IN AERS: 2.0000 | INHALATION_SPRAY | Freq: Four times a day (QID) | RESPIRATORY_TRACT | 0 refills | Status: DC | PRN

## 2023-12-12 NOTE — Patient Instructions (Signed)
 Asthma Triggers and Exacerbation In this video, you will learn about exposures and other factors that can lead to asthma attacks and trouble controlling asthma. To view the content, go to this web address: [email or web address]  This video will expire on: 08/14/2025. If you need access to this video following this date, please reach out to the healthcare provider who assigned it to you. This information is not intended to replace advice given to you by your health care provider. Make sure you discuss any questions you have with your health care provider. Elsevier Patient Education  2024 [employer].

## 2023-12-12 NOTE — Progress Notes (Signed)
 Subjective:    Patient ID: Brandi [name], female    DOB: 07-27-1986, 38 y.o.   MRN: [id number]  HPI  Discussed the use of AI scribe software for clinical note transcription with the patient, who gave verbal consent to proceed.   Brandi [name] is a 38 year old female with allergy-induced asthma who presents with increased phlegm, cough, and difficulty breathing.  She has experienced a significant increase in phlegm production, a persistent cough, and difficulty breathing, particularly following coughing fits. These symptoms have been present for a few days, with a notable worsening over the past two to three days.  She describes having a sore throat that was particularly painful when swallowing a few nights ago, which improved after drinking hot tea with honey. She experiences sinus pressure and headaches above her eyes. No runny nose, ear pain, nausea, vomiting, diarrhea, fever, chills, or flu-like body aches, although she notes body aches from coughing.  She has a history of allergy-induced asthma, which she believes is exacerbated by the current high pollen levels. She typically does not take any prescription medications for asthma but has been using over-the-counter allergy medications. She recalls being told in the past that her asthma was exercise-induced. She has previously been prescribed albuterol but has not been using it regularly.  She smokes, which may contribute to her respiratory symptoms.       Review of Systems   Past Medical History:  Diagnosis Date   Abnormal Pap smear of cervix 12/2015   ascus/pos- achd   Allergies    Arthritis    Asthma    Chlamydial cervicitis    Depression    pt has past treatment w/ Zoloft - good symptom control, but worsening IBS symptoms   Genital herpes 02/03/2021   [ ]  valtrex at 34-36wks   Genital HSV    GERD (gastroesophageal reflux disease)    Heart murmur    Hypertension    IBS (irritable bowel syndrome)    Prediabetes  01/19/2022   [ ]  early GTT   Pyelonephritis    Thrombocytosis 07/20/2021   Trichomoniasis     Current Outpatient Medications  Medication Sig Dispense Refill   clotrimazole-betamethasone (LOTRISONE) cream Apply topically 2 (two) times daily. Apply 1-2 times a day for worsening flare dry skin dermatitis of toes/feet, may re-use daily up to 1 week as needed. 30 g 0   ibuprofen (ADVIL) 600 MG tablet Take 1 tablet (600 mg total) by mouth every 6 (six) hours as needed. 30 tablet 0   lisinopril-hydrochlorothiazide (ZESTORETIC) 10-12.5 MG tablet Take 1 tablet by mouth daily. 90 tablet 1   omeprazole (PRILOSEC) 20 MG capsule Take 1 capsule (20 mg total) by mouth daily. 90 capsule 0   Semaglutide-Weight Management 0.25 MG/0.5ML SOAJ Inject 0.25 mg into the skin once a week. 2 mL 0   No current facility-administered medications for this visit.    Allergies  Allergen Reactions   Viberzi [Eluxadoline] Itching   Amoxicillin Nausea And Vomiting and Rash    Family History  Problem Relation Age of Onset   Diabetes Mother    Hypertension Mother    Asthma Father    Heart disease Father    Heart attack Father    Asthma Sister    Hypertension Sister    ADD / ADHD Son    Cancer Maternal Aunt    Cancer Maternal Uncle    Cancer Maternal Grandmother    Lung cancer Maternal Grandmother    Heart  disease Maternal Grandfather    Lung cancer Paternal Grandmother    Heart disease Paternal Grandfather    Birth defects Neg Hx    Stroke Neg Hx     Social History   Socioeconomic History   Marital status: Single    Spouse name: Not on file   Number of children: 1   Years of education: Not on file   Highest education level: Some college, no degree  Occupational History   Occupation: Horticulturist, commercial    Comment: [employer]  Tobacco Use   Smoking status: Every Day    Current packs/day: 0.00    Average packs/day: 1 pack/day for 12.0 years (12.0 ttl pk-yrs)    Types: Cigarettes    Start date:  12/07/2009    Last attempt to quit: 12/07/2021    Years since quitting: 2.0    Passive exposure: Past   Smokeless tobacco: Never  Vaping Use   Vaping status: Former   Substances: Nicotine, Flavoring  Substance and Sexual Activity   Alcohol use: Not Currently    Alcohol/week: 1.0 standard drink of alcohol    Types: 1 Glasses of wine per week    Comment: rare   Drug use: Not Currently    Frequency: 7.0 times per week    Types: Marijuana   Sexual activity: Not Currently  Other Topics Concern   Not on file  Social History Narrative   Not on file   Social Drivers of Health   Financial Resource Strain: Low Risk  (10/22/2018)   Overall Financial Resource Strain (CARDIA)    Difficulty of Paying Living Expenses: Not very hard  Food Insecurity: No Food Insecurity (07/07/2022)   Hunger Vital Sign    Worried About Running Out of Food in the Last Year: Never true    Ran Out of Food in the Last Year: Never true  Transportation Needs: No Transportation Needs (07/13/2022)   Received from [facility], [facility]   PRAPARE - Transportation    In the past 12 months, has lack of transportation kept you from medical appointments or from getting medications?: No    Lack of Transportation (Non-Medical): No  Physical Activity: Not on file  Stress: Not on file  Social Connections: Not on file  Intimate Partner Violence: Not At Risk (07/07/2022)   Humiliation, Afraid, Rape, and Kick questionnaire    Fear of Current or Ex-Partner: No    Emotionally Abused: No    Physically Abused: No    Sexually Abused: No     Constitutional: Pt reports headache. Denies fever, malaise, fatigue, or abrupt weight changes.  HEENT: Pt reports sinus pressure, sore throat. Denies eye pain, eye redness, ear pain, ringing in the ears, wax buildup, runny nose, nasal congestion, bloody nose. Respiratory: Pt reports cough and shortness of breath. Denies difficulty breathing.    Cardiovascular: Denies chest pain, chest tightness, palpitations or swelling in the hands or feet.  Gastrointestinal: Denies abdominal pain, bloating, constipation, diarrhea or blood in the stool.  Musculoskeletal: Pt reports body aches. Denies decrease in range of motion, difficulty with gait,  or joint swelling.  Neurological: Denies dizziness, difficulty with memory, difficulty with speech or problems with balance and coordination.    No other specific complaints in a complete review of systems (except as listed in HPI above).      Objective:   Physical Exam  BP 128/78 (BP Location: Right Arm, Patient Position: Sitting, Cuff Size: Large)   Pulse (!) 111  Ht 5\' 6"  (1.676 m)   Wt 241 lb 6.4 oz (109.5 kg)   LMP 09/27/2022   SpO2 98%   BMI 38.96 kg/m   Wt Readings from Last 3 Encounters:  05/23/23 237 lb (107.5 kg)  04/29/23 242 lb (109.8 kg)  01/16/23 236 lb (107 kg)    General: Appears her stated age, obese, in NAD. Skin: Warm, dry and intact.  HEENT: Head: normal shape and size, no sinus tenderness noted; Eyes: sclera white, no icterus, conjunctiva pink, PERRLA and EOMs intact; Throat/Mouth: Teeth present, mucosa erythematous and moist, + PND, no exudate, lesions or ulcerations noted.  Neck:  No adenopathy noted. Cardiovascular: Tachycardic with normal rhythm. S1,S2 noted.  No murmur, rubs or gallops noted. No JVD or BLE edema. No carotid bruits noted. Pulmonary/Chest: Normal effort and positive vesicular breath sounds with bilateral inspiratory and extra wheezing noted. No respiratory distress. No rales or ronchi noted.  Musculoskeletal:  No difficulty with gait.  Neurological: Alert and oriented.  Coordination normal.    BMET    Component Value Date/Time   NA 137 04/29/2023 1332   K 4.2 04/29/2023 1332   CL 104 04/29/2023 1332   CO2 22 04/29/2023 1332   GLUCOSE 145 (H) 04/29/2023 1332   BUN 7 04/29/2023 1332   CREATININE 0.67 04/29/2023 1332   CALCIUM 9.4  04/29/2023 1332   GFRNONAA >60 12/11/2022 0834   GFRNONAA 112 05/30/2020 1051   GFRAA 130 05/30/2020 1051    Lipid Panel     Component Value Date/Time   CHOL 186 04/29/2023 1332   TRIG 236 (H) 04/29/2023 1332   HDL 43 (L) 04/29/2023 1332   CHOLHDL 4.3 04/29/2023 1332   LDLCALC 107 (H) 04/29/2023 1332    CBC    Component Value Date/Time   WBC 8.4 04/29/2023 1332   RBC 4.79 04/29/2023 1332   HGB 12.4 04/29/2023 1332   HGB 13.4 10/02/2022 1201   HCT 39.1 04/29/2023 1332   HCT 40.6 10/02/2022 1201   PLT 410 (H) 04/29/2023 1332   PLT 520 (H) 10/02/2022 1201   MCV 81.6 04/29/2023 1332   MCV 88 10/02/2022 1201   MCH 25.9 (L) 04/29/2023 1332   MCHC 31.7 (L) 04/29/2023 1332   RDW 17.5 (H) 04/29/2023 1332   RDW 13.4 10/02/2022 1201   LYMPHSABS 2.8 07/06/2022 2341   LYMPHSABS 2.1 02/22/2022 1520   MONOABS 1.0 07/06/2022 2341   EOSABS 0.4 07/06/2022 2341   EOSABS 0.1 02/22/2022 1520   BASOSABS 0.0 07/06/2022 2341   BASOSABS 0.0 02/22/2022 1520    Hgb A1C Lab Results  Component Value Date   HGBA1C 6.2 (H) 04/29/2023            Assessment & Plan:   Assessment and Plan    Allergy-induced asthma exacerbation Exacerbation due to high pollen levels and possible smoking contribution. Wheezing noted. Currently using OTC allergy medication without prescription asthma medication. - Prescribe prednisone taper for 6 days. - Prescribe albuterol inhaler 1 to 2 puffs every 4-6 hours as needed. - Prescribe promethazine DM cough syrup-sedation caution given. - Advise continuation of daily antihistamine use.  Smoking Smoking exacerbates asthma symptoms and contributes to respiratory issues.       Keep your scheduled appointment for your physical exam Nicki Reaper, NP

## 2023-12-23 ENCOUNTER — Ambulatory Visit: Admitting: Obstetrics and Gynecology

## 2023-12-23 ENCOUNTER — Other Ambulatory Visit (HOSPITAL_COMMUNITY)
Admission: RE | Admit: 2023-12-23 | Discharge: 2023-12-23 | Disposition: A | Discharge status: Home / Self Care | Source: Ambulatory Visit | Attending: Obstetrics and Gynecology | Admitting: Obstetrics and Gynecology

## 2023-12-23 VITALS — BP 123/83 | HR 103 | Wt 243.0 lb

## 2023-12-23 DIAGNOSIS — N870 Mild cervical dysplasia: Secondary | ICD-10-CM | POA: Diagnosis present

## 2023-12-23 DIAGNOSIS — N760 Acute vaginitis: Secondary | ICD-10-CM | POA: Insufficient documentation

## 2023-12-23 DIAGNOSIS — B9689 Other specified bacterial agents as the cause of diseases classified elsewhere: Secondary | ICD-10-CM | POA: Insufficient documentation

## 2023-12-23 DIAGNOSIS — Z90710 Acquired absence of both cervix and uterus: Secondary | ICD-10-CM

## 2023-12-23 DIAGNOSIS — B3731 Acute candidiasis of vulva and vagina: Secondary | ICD-10-CM | POA: Insufficient documentation

## 2023-12-23 MED ORDER — METRONIDAZOLE 500 MG PO TABS: 500.0000 mg | ORAL_TABLET | Freq: Two times a day (BID) | ORAL | 1 refills | Status: AC

## 2023-12-23 MED ORDER — FLUCONAZOLE 150 MG PO TABS: 150.0000 mg | ORAL_TABLET | Freq: Once | ORAL | 1 refills | Status: AC

## 2023-12-23 NOTE — Progress Notes (Signed)
 Obstetrics and Gynecology Visit Return Patient Evaluation  Appointment Date: 12/23/2023  Primary Care Provider: Loyal Ruffing Clinic: Center for [facility]  Chief Complaint:  surveillance pap smear, vaginitis  History of Present Illness:  Brandi [name] is a 38 y.o. with above CC. Hx significant for TLH/BS a year ago for benign indications and CIN1 on final pathology. Vaginitis s/s recently and ongoing, no VB  Review of Systems: as noted in the History of Present Illness.  Patient Active Problem List   Diagnosis Date Noted   Prediabetes 04/29/2023   Thrombocytosis 04/29/2023   Class 2 obesity due to excess calories with body mass index (BMI) of 38.0 to 38.9 in adult 02/22/2022   Mixed hyperlipidemia 07/20/2021   Chronic lower back pain 05/30/2020   Migraines 05/30/2020   Chronic pain of both knees 05/30/2020   Irritable bowel syndrome with diarrhea 10/22/2018   Essential hypertension 10/22/2018   Recurrent major depressive disorder, in partial remission (HCC) 10/22/2018   Gastroesophageal reflux disease 10/22/2018   Medications:  Montine Apo had no medications administered during this visit. Current Outpatient Medications  Medication Sig Dispense Refill   fluconazole  (DIFLUCAN ) 150 MG tablet Take 1 tablet (150 mg total) by mouth once for 1 dose. Can take additional dose three days later if symptoms persist 2 tablet 1   metroNIDAZOLE  (FLAGYL ) 500 MG tablet Take 1 tablet (500 mg total) by mouth 2 (two) times daily for 7 days. 14 tablet 1   albuterol  (VENTOLIN  HFA) 108 (90 Base) MCG/ACT inhaler Inhale 2 puffs into the lungs every 6 (six) hours as needed for wheezing or shortness of breath. 8 g 0   clotrimazole -betamethasone  (LOTRISONE ) cream Apply topically 2 (two) times daily. Apply 1-2 times a day for worsening flare dry skin dermatitis of toes/feet, may re-use daily up to 1 week as needed. 30 g 0   ibuprofen  (ADVIL ) 600 MG tablet Take 1  tablet (600 mg total) by mouth every 6 (six) hours as needed. 30 tablet 0   lisinopril -hydrochlorothiazide  (ZESTORETIC ) 10-12.5 MG tablet Take 1 tablet by mouth daily. 90 tablet 1   omeprazole  (PRILOSEC) 20 MG capsule Take 1 capsule (20 mg total) by mouth daily. 90 capsule 0   predniSONE  (DELTASONE ) 10 MG tablet Take 6 tabs on day 1, 5 tabs on day 2, 4 tabs on day 3, 3 tabs on day 4, 2 tabs on day 5, 1 tab on day 6 21 tablet 0   Semaglutide -Weight Management 0.25 MG/0.5ML SOAJ Inject 0.25 mg into the skin once a week. (Patient not taking: Reported on 12/12/2023) 2 mL 0   No current facility-administered medications for this visit.   Allergies: is allergic to viberzi  [eluxadoline ] and amoxicillin.  Physical Exam:  BP 123/83   Pulse (!) 103   Wt 243 lb (110.2 kg)   LMP 09/27/2022   BMI 39.22 kg/m  Body mass index is 39.22 kg/m. General appearance: Well nourished, well developed female in no acute distress.  Abdomen: diffusely non tender to palpation, non distended, and no masses, hernias Neuro/Psych:  Normal mood and affect.    Pelvic exam:  pelvic exam performed in the presence of a chaperone EGBUS: b/l erythema at the labia majora Vaginal vault: white cottage cheese like d/c, +vaginal dryness Cuff: wnl, nttp  Assessment: patient stable  Plan:  1. BV (bacterial vaginosis) - Cytology - PAP - Cervicovaginal ancillary only( [facility])  2. Vulvovaginal candidiasis - Cytology - PAP - Cervicovaginal ancillary only( [facility])  3. Dysplasia of cervix, low grade (CIN 1) (Primary) - Cytology - PAP  4. History of hysterectomy  5. Acute vaginitis - Cervicovaginal ancillary only( [place])  Return if symptoms worsen or fail to improve.  Future Appointments  Date Time Provider Department Center  12/26/2023  2:40 PM Baity, Rankin Buzzard, NP [facility]    Tyler Gallant MD Attending Center for [facility])

## 2023-12-23 NOTE — Progress Notes (Signed)
 RGYN here for pap.  CC: vaginal irritation x 2 days now possible yeast infection. Pt notes itching and discomfort and now raw.

## 2023-12-24 ENCOUNTER — Encounter: Payer: Self-pay | Admitting: Obstetrics and Gynecology

## 2023-12-25 LAB — CERVICOVAGINAL ANCILLARY ONLY
Bacterial Vaginitis (gardnerella): POSITIVE — AB
Candida Glabrata: NEGATIVE
Candida Vaginitis: POSITIVE — AB
Chlamydia: NEGATIVE
Comment: NEGATIVE
Comment: NEGATIVE
Comment: NEGATIVE
Comment: NEGATIVE
Comment: NEGATIVE
Comment: NORMAL
Neisseria Gonorrhea: NEGATIVE
Trichomonas: NEGATIVE

## 2023-12-26 ENCOUNTER — Encounter: Payer: Self-pay | Admitting: Internal Medicine

## 2023-12-26 ENCOUNTER — Telehealth: Payer: Self-pay

## 2023-12-26 ENCOUNTER — Ambulatory Visit (INDEPENDENT_AMBULATORY_CARE_PROVIDER_SITE_OTHER): Admitting: Internal Medicine

## 2023-12-26 VITALS — BP 118/74 | Ht 66.0 in | Wt 241.4 lb

## 2023-12-26 DIAGNOSIS — E66812 Obesity, class 2: Secondary | ICD-10-CM | POA: Diagnosis not present

## 2023-12-26 DIAGNOSIS — Z0001 Encounter for general adult medical examination with abnormal findings: Secondary | ICD-10-CM

## 2023-12-26 DIAGNOSIS — E782 Mixed hyperlipidemia: Secondary | ICD-10-CM | POA: Diagnosis not present

## 2023-12-26 DIAGNOSIS — R7303 Prediabetes: Secondary | ICD-10-CM

## 2023-12-26 DIAGNOSIS — Z6838 Body mass index (BMI) 38.0-38.9, adult: Secondary | ICD-10-CM

## 2023-12-26 MED ORDER — SEMAGLUTIDE-WEIGHT MANAGEMENT 0.25 MG/0.5ML ~~LOC~~ SOAJ: 0.2500 mg | SUBCUTANEOUS | 0 refills | Status: DC

## 2023-12-26 NOTE — Progress Notes (Signed)
 Subjective:    Patient ID: Brandi [name], female    DOB: Jun 18, 1986, 38 y.o.   MRN: [id number]  HPI  Patient presents to clinic today for annual exam.  Flu: never Tetanus: 08/2022 COVID: Pfizer x2 Pap smear: 12/2023, abnormal (follows with GYN) Dentist: as needed  Diet: She does eat meat. She consumes fruits and veggies. She does eat some fruits and veggies. She drinks mostly soda and water . Exercise: None   Review of Systems     Past Medical History:  Diagnosis Date   Abnormal Pap smear of cervix 12/2015   ascus/pos- achd   Allergies    Arthritis    Asthma    Chlamydial cervicitis    Depression    pt has past treatment w/ Zoloft - good symptom control, but worsening IBS symptoms   Genital herpes 02/03/2021   [ ]  valtrex  at 34-36wks   Genital HSV    GERD (gastroesophageal reflux disease)    Heart murmur    Hypertension    IBS (irritable bowel syndrome)    Prediabetes 01/19/2022   [ ]  early GTT   Pyelonephritis    Thrombocytosis 07/20/2021   Trichomoniasis     Current Outpatient Medications  Medication Sig Dispense Refill   albuterol  (VENTOLIN  HFA) 108 (90 Base) MCG/ACT inhaler Inhale 2 puffs into the lungs every 6 (six) hours as needed for wheezing or shortness of breath. 8 g 0   clotrimazole -betamethasone  (LOTRISONE ) cream Apply topically 2 (two) times daily. Apply 1-2 times a day for worsening flare dry skin dermatitis of toes/feet, may re-use daily up to 1 week as needed. 30 g 0   ibuprofen  (ADVIL ) 600 MG tablet Take 1 tablet (600 mg total) by mouth every 6 (six) hours as needed. 30 tablet 0   lisinopril -hydrochlorothiazide  (ZESTORETIC ) 10-12.5 MG tablet Take 1 tablet by mouth daily. 90 tablet 1   metroNIDAZOLE  (FLAGYL ) 500 MG tablet Take 1 tablet (500 mg total) by mouth 2 (two) times daily for 7 days. 14 tablet 1   omeprazole  (PRILOSEC) 20 MG capsule Take 1 capsule (20 mg total) by mouth daily. 90 capsule 0   predniSONE  (DELTASONE ) 10 MG tablet Take 6  tabs on day 1, 5 tabs on day 2, 4 tabs on day 3, 3 tabs on day 4, 2 tabs on day 5, 1 tab on day 6 21 tablet 0   Semaglutide -Weight Management 0.25 MG/0.5ML SOAJ Inject 0.25 mg into the skin once a week. (Patient not taking: Reported on 12/12/2023) 2 mL 0   No current facility-administered medications for this visit.    Allergies  Allergen Reactions   Viberzi  [Eluxadoline ] Itching   Amoxicillin Nausea And Vomiting and Rash    Family History  Problem Relation Age of Onset   Diabetes Mother    Hypertension Mother    Asthma Father    Heart disease Father    Heart attack Father    Asthma Sister    Hypertension Sister    ADD / ADHD Son    Cancer Maternal Aunt    Cancer Maternal Uncle    Cancer Maternal Grandmother    Lung cancer Maternal Grandmother    Heart disease Maternal Grandfather    Lung cancer Paternal Grandmother    Heart disease Paternal Grandfather    Birth defects Neg Hx    Stroke Neg Hx     Social History   Socioeconomic History   Marital status: Single    Spouse name: Not on file   Number  of children: 1   Years of education: Not on file   Highest education level: Some college, no degree  Occupational History   Occupation: Debt Collection    Comment: [employer]  Tobacco Use   Smoking status: Every Day    Current packs/day: 0.00    Average packs/day: 1 pack/day for 12.0 years (12.0 ttl pk-yrs)    Types: Cigarettes    Start date: 12/07/2009    Last attempt to quit: 12/07/2021    Years since quitting: 2.0    Passive exposure: Past   Smokeless tobacco: Never  Vaping Use   Vaping status: Former   Substances: Nicotine, Flavoring  Substance and Sexual Activity   Alcohol use: Not Currently    Alcohol/week: 1.0 standard drink of alcohol    Types: 1 Glasses of wine per week    Comment: rare   Drug use: Not Currently    Frequency: 7.0 times per week    Types: Marijuana   Sexual activity: Not Currently  Other Topics Concern   Not on file  Social History  Narrative   Not on file   Social Drivers of Health   Financial Resource Strain: Low Risk  (10/22/2018)   Overall Financial Resource Strain (CARDIA)    Difficulty of Paying Living Expenses: Not very hard  Food Insecurity: No Food Insecurity (07/07/2022)   Hunger Vital Sign    Worried About Running Out of Food in the Last Year: Never true    Ran Out of Food in the Last Year: Never true  Transportation Needs: No Transportation Needs (07/13/2022)   Received from [facility], [facility]   PRAPARE - Transportation    In the past 12 months, has lack of transportation kept you from medical appointments or from getting medications?: No    Lack of Transportation (Non-Medical): No  Physical Activity: Not on file  Stress: Not on file  Social Connections: Not on file  Intimate Partner Violence: Not At Risk (07/07/2022)   Humiliation, Afraid, Rape, and Kick questionnaire    Fear of Current or Ex-Partner: No    Emotionally Abused: No    Physically Abused: No    Sexually Abused: No     Constitutional: Pt reports intermittent headaches. Denies fever, malaise, fatigue, or abrupt weight changes.  HEENT: Denies eye pain, eye redness, ear pain, ringing in the ears, wax buildup, runny nose, nasal congestion, bloody nose, or sore throat. Respiratory: Denies difficulty breathing, shortness of breath, cough or sputum production.   Cardiovascular: Denies chest pain, chest tightness, palpitations or swelling in the hands or feet.  Gastrointestinal: Patient reports intermittent diarrhea.  Denies abdominal pain, bloating, constipation, or blood in the stool.  GU: Denies urgency, frequency, pain with urination, burning sensation, blood in urine, odor or discharge. Musculoskeletal: Patient reports chronic joint pain.  Denies decrease in range of motion, difficulty with gait,  or joint swelling.  Skin: Denies redness, rashes, lesions or ulcercations.  Neurological: Denies  dizziness, difficulty with memory, difficulty with speech or problems with balance and coordination.  Psych: Patient has a history of depression.  Denies anxiety, SI/HI.  No other specific complaints in a complete review of systems (except as listed in HPI above).  Objective:   Physical Exam  BP 118/74 (BP Location: Left Arm, Patient Position: Sitting, Cuff Size: Large)   Ht 5\' 6"  (1.676 m)   Wt 241 lb 6.4 oz (109.5 kg)   LMP 09/27/2022   BMI 38.96 kg/m  Wt Readings from Last 3 Encounters:  12/23/23 243 lb (110.2 kg)  12/12/23 241 lb 6.4 oz (109.5 kg)  05/23/23 237 lb (107.5 kg)    General: Appears her stated age, obese, in NAD. Skin: Warm, dry and intact.  HEENT: Head: normal shape and size; Eyes: sclera white, no icterus, conjunctiva pink, PERRLA and EOMs intact;  Neck:  Neck supple, trachea midline. No masses, lumps or thyromegaly present.  Cardiovascular: Normal rate and rhythm. S1,S2 noted.  No murmur, rubs or gallops noted. No JVD or BLE edema. No carotid bruits noted. Pulmonary/Chest: Normal effort and positive vesicular breath sounds. No respiratory distress. No wheezes, rales or ronchi noted.  Abdomen: Normal bowel sounds.  Musculoskeletal: Strength 5/5 BUE/BLE. No difficulty with gait.  Neurological: Alert and oriented. Cranial nerves II-XII grossly intact. Coordination normal.  Psychiatric: Mood and affect normal. Behavior is normal. Judgment and thought content normal.    BMET    Component Value Date/Time   NA 137 04/29/2023 1332   K 4.2 04/29/2023 1332   CL 104 04/29/2023 1332   CO2 22 04/29/2023 1332   GLUCOSE 145 (H) 04/29/2023 1332   BUN 7 04/29/2023 1332   CREATININE 0.67 04/29/2023 1332   CALCIUM  9.4 04/29/2023 1332   GFRNONAA >60 12/11/2022 0834   GFRNONAA 112 05/30/2020 1051   GFRAA 130 05/30/2020 1051    Lipid Panel     Component Value Date/Time   CHOL 186 04/29/2023 1332   TRIG 236 (H) 04/29/2023 1332   HDL 43 (L) 04/29/2023 1332    CHOLHDL 4.3 04/29/2023 1332   LDLCALC 107 (H) 04/29/2023 1332    CBC    Component Value Date/Time   WBC 8.4 04/29/2023 1332   RBC 4.79 04/29/2023 1332   HGB 12.4 04/29/2023 1332   HGB 13.4 10/02/2022 1201   HCT 39.1 04/29/2023 1332   HCT 40.6 10/02/2022 1201   PLT 410 (H) 04/29/2023 1332   PLT 520 (H) 10/02/2022 1201   MCV 81.6 04/29/2023 1332   MCV 88 10/02/2022 1201   MCH 25.9 (L) 04/29/2023 1332   MCHC 31.7 (L) 04/29/2023 1332   RDW 17.5 (H) 04/29/2023 1332   RDW 13.4 10/02/2022 1201   LYMPHSABS 2.8 07/06/2022 2341   LYMPHSABS 2.1 02/22/2022 1520   MONOABS 1.0 07/06/2022 2341   EOSABS 0.4 07/06/2022 2341   EOSABS 0.1 02/22/2022 1520   BASOSABS 0.0 07/06/2022 2341   BASOSABS 0.0 02/22/2022 1520    Hgb A1C Lab Results  Component Value Date   HGBA1C 6.2 (H) 04/29/2023           Assessment & Plan:    Preventative Health Maintenance:  Encouraged her to get a flu shot in the fall Tetanus UTD Encouraged her get her COVID booster Pap smear UTD Encouraged her to consume a balanced diet and exercise regimen Advised her to see an eye doctor and dentist annually We will check CBC, c-Met, lipid profile and A1c today  RTC in 6 months, follow-up chronic conditions Helayne Lo, NP

## 2023-12-26 NOTE — Patient Instructions (Signed)

## 2023-12-26 NOTE — Assessment & Plan Note (Signed)
 Encouraged diet and exercise for weight loss ?

## 2023-12-26 NOTE — Telephone Encounter (Signed)
 Lear Prosper ([name]) Rx #: [id number] Wegovy  0.25MG /0.5ML auto-injectors Form [employer] [place]  Medicaid Electronic PA Form [phone] NCPDP) Created 35 minutes ago Sent to Plan Determination Request Not Sent This PA has been accessed, but not sent to the plan. Please fill out the required fields below and click "Send to Plan."

## 2023-12-27 ENCOUNTER — Encounter: Payer: Self-pay | Admitting: Internal Medicine

## 2023-12-27 DIAGNOSIS — E782 Mixed hyperlipidemia: Secondary | ICD-10-CM

## 2023-12-27 LAB — COMPREHENSIVE METABOLIC PANEL WITH GFR
AG Ratio: 1.4 (calc) (ref 1.0–2.5)
ALT: 18 U/L (ref 6–29)
AST: 17 U/L (ref 10–30)
Albumin: 3.9 g/dL (ref 3.6–5.1)
Alkaline phosphatase (APISO): 77 U/L (ref 31–125)
BUN: 7 mg/dL (ref 7–25)
CO2: 23 mmol/L (ref 20–32)
Calcium: 9.2 mg/dL (ref 8.6–10.2)
Chloride: 102 mmol/L (ref 98–110)
Creat: 0.75 mg/dL (ref 0.50–0.97)
Globulin: 2.8 g/dL (ref 1.9–3.7)
Glucose, Bld: 121 mg/dL (ref 65–139)
Potassium: 4.1 mmol/L (ref 3.5–5.3)
Sodium: 137 mmol/L (ref 135–146)
Total Bilirubin: 0.3 mg/dL (ref 0.2–1.2)
Total Protein: 6.7 g/dL (ref 6.1–8.1)
eGFR: 105 mL/min/{1.73_m2} (ref >=60)

## 2023-12-27 LAB — CBC
HCT: 40.1 % (ref 35.0–45.0)
Hemoglobin: 13.4 g/dL (ref 11.7–15.5)
MCH: 28.9 pg (ref 27.0–33.0)
MCHC: 33.4 g/dL (ref 32.0–36.0)
MCV: 86.4 fL (ref 80.0–100.0)
MPV: 11.1 fL (ref 7.5–12.5)
Platelets: 418 10*3/uL — ABNORMAL HIGH (ref 140–400)
RBC: 4.64 10*6/uL (ref 3.80–5.10)
RDW: 14.5 % (ref 11.0–15.0)
WBC: 8.8 10*3/uL (ref 3.8–10.8)

## 2023-12-27 LAB — LIPID PANEL
Cholesterol: 236 mg/dL — ABNORMAL HIGH (ref <200)
HDL: 37 mg/dL — ABNORMAL LOW (ref >=50)
Non-HDL Cholesterol (Calc): 199 mg/dL — ABNORMAL HIGH (ref <130)
Total CHOL/HDL Ratio: 6.4 (calc) — ABNORMAL HIGH (ref <5.0)
Triglycerides: 496 mg/dL — ABNORMAL HIGH (ref <150)

## 2023-12-27 LAB — CYTOLOGY - PAP
Comment: NEGATIVE
Diagnosis: NEGATIVE
Diagnosis: REACTIVE
High risk HPV: NEGATIVE

## 2023-12-27 LAB — HEMOGLOBIN A1C
Hgb A1c MFr Bld: 6.3 % — ABNORMAL HIGH (ref <5.7)
Mean Plasma Glucose: 134 mg/dL
eAG (mmol/L): 7.4 mmol/L

## 2023-12-27 MED ORDER — ROSUVASTATIN CALCIUM 5 MG PO TABS: 5.0000 mg | ORAL_TABLET | Freq: Every day | ORAL | 1 refills | Status: DC

## 2023-12-27 NOTE — Telephone Encounter (Signed)
 Lear Prosper ([name]) Rx #: [id number] Wegovy  0.25MG /0.5ML auto-injectors Form [employer] [place]  Medicaid Electronic PA Form [phone] NCPDP) Created 17 hours ago Sent to Plan 16 hours ago Plan Response 16 hours ago Submit Clinical Questions less than a minute ago Determination Wait for Determination Please wait for [employer]   Medicaid to return a determination.

## 2023-12-27 NOTE — Telephone Encounter (Signed)
 Brandi [name] ([name]) Rx #: [id number] Wegovy  0.25MG /0.5ML auto-injectors Form [facility] [place]  Medicaid Electronic PA Form [phone] NCPDP) Created 19 hours ago Sent to Plan 19 hours ago Plan Response 19 hours ago Submit Clinical Questions 2 hours ago Determination Favorable 6 minutes ago Your prior authorization for Wegovy  has been approved! More Info Personalized support and financial assistance may be available through the [employer] program. For more information, and to see program requirements, click on the More Info button to the right.  Message from plan: PA Case: [id number], Status: Approved, Coverage Starts on: 12/27/2023 12:00:00 AM, Coverage Ends on: 06/24/2024 12:00:00 AM.. Authorization Expiration Date: June 24, 2024.

## 2023-12-30 ENCOUNTER — Other Ambulatory Visit: Payer: Self-pay | Admitting: *Deleted

## 2023-12-30 MED ORDER — VALACYCLOVIR HCL 1 G PO TABS: 1000.0000 mg | ORAL_TABLET | Freq: Every day | ORAL | 2 refills | Status: AC

## 2024-01-02 ENCOUNTER — Encounter: Payer: Self-pay | Admitting: Obstetrics and Gynecology

## 2024-01-23 ENCOUNTER — Encounter: Payer: Self-pay | Admitting: Internal Medicine

## 2024-01-24 MED ORDER — SEMAGLUTIDE-WEIGHT MANAGEMENT 0.5 MG/0.5ML ~~LOC~~ SOAJ
0.5000 mg | SUBCUTANEOUS | 0 refills | Status: AC
Start: 2024-01-24 — End: ?

## 2024-02-13 ENCOUNTER — Encounter: Payer: Self-pay | Admitting: Internal Medicine

## 2024-02-14 MED ORDER — SEMAGLUTIDE-WEIGHT MANAGEMENT 0.25 MG/0.5ML ~~LOC~~ SOAJ: 0.2500 mg | SUBCUTANEOUS | 0 refills | Status: DC

## 2024-03-17 ENCOUNTER — Other Ambulatory Visit: Payer: Self-pay | Admitting: Internal Medicine

## 2024-03-19 NOTE — Telephone Encounter (Signed)
 Requested Prescriptions  Pending Prescriptions Disp Refills   Semaglutide -Weight Management (WEGOVY ) 0.25 MG/0.5ML SOAJ [Pharmacy Med Name: WEGOVY  0.25MG /0.5ML INJ ( 4 PENS)] 2 mL 2    Sig: ADMINISTER 0.25 MG UNDER THE SKIN 1 TIME A WEEK     Endocrinology:  Diabetes - GLP-1 Receptor Agonists - semaglutide  Failed - 03/19/2024 11:30 AM      Failed - HBA1C in normal range and within 180 days    Hgb A1c MFr Bld  Date Value Ref Range Status  12/26/2023 6.3 (H) <5.7 % Final    Comment:    For someone without known diabetes, a hemoglobin  A1c value between 5.7% and 6.4% is consistent with prediabetes and should be confirmed with a  follow-up test. . For someone with known diabetes, a value <7% indicates that their diabetes is well controlled. A1c targets should be individualized based on duration of diabetes, age, comorbid conditions, and other considerations. . This assay result is consistent with an increased risk of diabetes. . Currently, no consensus exists regarding use of hemoglobin A1c for diagnosis of diabetes for children. .          Passed - Cr in normal range and within 360 days    Creat  Date Value Ref Range Status  12/26/2023 0.75 0.50 - 0.97 mg/dL Final         Passed - Valid encounter within last 6 months    Recent Outpatient Visits           2 months ago Encounter for general adult medical examination with abnormal findings   [place] [facility] [place], [place] W, NP   3 months ago Mild intermittent extrinsic asthma with acute exacerbation   [place] [facility] [place], Angeline ORN, [place]

## 2024-03-27 ENCOUNTER — Other Ambulatory Visit

## 2024-03-27 DIAGNOSIS — E782 Mixed hyperlipidemia: Secondary | ICD-10-CM

## 2024-03-27 LAB — COMPREHENSIVE METABOLIC PANEL WITH GFR
AG Ratio: 1.4 (calc) (ref 1.0–2.5)
ALT: 25 U/L (ref 6–29)
AST: 24 U/L (ref 10–30)
Albumin: 4.2 g/dL (ref 3.6–5.1)
Alkaline phosphatase (APISO): 78 U/L (ref 31–125)
BUN/Creatinine Ratio: 9 (calc) (ref 6–22)
BUN: 6 mg/dL — ABNORMAL LOW (ref 7–25)
CO2: 25 mmol/L (ref 20–32)
Calcium: 9.5 mg/dL (ref 8.6–10.2)
Chloride: 104 mmol/L (ref 98–110)
Creat: 0.65 mg/dL (ref 0.50–0.97)
Globulin: 3 g/dL (ref 1.9–3.7)
Glucose, Bld: 93 mg/dL (ref 65–99)
Potassium: 3.8 mmol/L (ref 3.5–5.3)
Sodium: 140 mmol/L (ref 135–146)
Total Bilirubin: 0.4 mg/dL (ref 0.2–1.2)
Total Protein: 7.2 g/dL (ref 6.1–8.1)
eGFR: 116 mL/min/1.73m2 (ref >=60)

## 2024-03-27 LAB — LIPID PANEL
Cholesterol: 154 mg/dL (ref <200)
HDL: 40 mg/dL — ABNORMAL LOW (ref >=50)
LDL Cholesterol (Calc): 89 mg/dL
Non-HDL Cholesterol (Calc): 114 mg/dL (ref <130)
Total CHOL/HDL Ratio: 3.9 (calc) (ref <5.0)
Triglycerides: 145 mg/dL (ref <150)

## 2024-03-30 ENCOUNTER — Ambulatory Visit: Payer: Self-pay | Admitting: Internal Medicine

## 2024-06-09 ENCOUNTER — Other Ambulatory Visit: Payer: Self-pay | Admitting: Internal Medicine

## 2024-06-11 NOTE — Telephone Encounter (Signed)
 Requested Prescriptions  Pending Prescriptions Disp Refills   WEGOVY  0.25 MG/0.5ML SOAJ SQ injection [Pharmacy Med Name: WEGOVY  0.25MG /0.5ML INJ ( 4 PENS)] 2 mL 2    Sig: ADMINISTER 0.25 MG UNDER THE SKIN 1 TIME A WEEK     Endocrinology:  Diabetes - GLP-1 Receptor Agonists - semaglutide  Failed - 06/11/2024  1:34 PM      Failed - HBA1C in normal range and within 180 days    Hgb A1c MFr Bld  Date Value Ref Range Status  12/26/2023 6.3 (H) <5.7 % Final    Comment:    For someone without known diabetes, a hemoglobin  A1c value between 5.7% and 6.4% is consistent with prediabetes and should be confirmed with a  follow-up test. . For someone with known diabetes, a value <7% indicates that their diabetes is well controlled. A1c targets should be individualized based on duration of diabetes, age, comorbid conditions, and other considerations. . This assay result is consistent with an increased risk of diabetes. . Currently, no consensus exists regarding use of hemoglobin A1c for diagnosis of diabetes for children. .          Passed - Cr in normal range and within 360 days    Creat  Date Value Ref Range Status  03/27/2024 0.65 0.50 - 0.97 mg/dL Final         Passed - Valid encounter within last 6 months    Recent Outpatient Visits           5 months ago Encounter for general adult medical examination with abnormal findings   [facility] [place], [place] W, NP   6 months ago Mild intermittent extrinsic asthma with acute exacerbation   [facility] [place], Angeline ORN, [place]

## 2024-06-12 ENCOUNTER — Other Ambulatory Visit (HOSPITAL_COMMUNITY): Payer: Self-pay

## 2024-06-12 ENCOUNTER — Telehealth: Payer: Self-pay

## 2024-06-12 NOTE — Telephone Encounter (Signed)
 Apologies, re routing

## 2024-06-12 NOTE — Telephone Encounter (Signed)
Not a [facility] patient.  

## 2024-06-12 NOTE — Telephone Encounter (Signed)
 Pharmacy Patient Advocate Encounter   Received notification from Onbase that prior authorization for Wegovy  0.25mg /0.36ml Pen is required/requested.   Insurance verification completed.   The patient is insured through [employer].   Per test claim: Effective October 1st, Medicaid will discontinue coverage of GLP1 medications for weight loss (such as Wegovy  and Zepbound), unless the patient has a documented history of a heart attack or stroke. Zepbound will continue to be covered only for patients with moderate to severe sleep apnea (AHI 15-30) and a BMI greater than 40. Because of this change, the prior authorization team will not be submitting new PA requests for GLP1 medications prescribed for weight loss, as patients will be unable to continue therapy under Medicaid coverage.

## 2024-06-26 ENCOUNTER — Ambulatory Visit: Admitting: Internal Medicine

## 2024-06-26 ENCOUNTER — Encounter: Payer: Self-pay | Admitting: Internal Medicine

## 2024-06-26 VITALS — BP 124/74 | Ht 66.0 in | Wt 220.0 lb

## 2024-06-26 DIAGNOSIS — I10 Essential (primary) hypertension: Secondary | ICD-10-CM

## 2024-06-26 DIAGNOSIS — E66812 Obesity, class 2: Secondary | ICD-10-CM

## 2024-06-26 DIAGNOSIS — R7303 Prediabetes: Secondary | ICD-10-CM

## 2024-06-26 DIAGNOSIS — E782 Mixed hyperlipidemia: Secondary | ICD-10-CM

## 2024-06-26 DIAGNOSIS — M25561 Pain in right knee: Secondary | ICD-10-CM

## 2024-06-26 DIAGNOSIS — G8929 Other chronic pain: Secondary | ICD-10-CM

## 2024-06-26 DIAGNOSIS — D75839 Thrombocytosis, unspecified: Secondary | ICD-10-CM

## 2024-06-26 DIAGNOSIS — F3341 Major depressive disorder, recurrent, in partial remission: Secondary | ICD-10-CM

## 2024-06-26 DIAGNOSIS — A6004 Herpesviral vulvovaginitis: Secondary | ICD-10-CM

## 2024-06-26 DIAGNOSIS — K580 Irritable bowel syndrome with diarrhea: Secondary | ICD-10-CM

## 2024-06-26 DIAGNOSIS — Z6835 Body mass index (BMI) 35.0-35.9, adult: Secondary | ICD-10-CM

## 2024-06-26 DIAGNOSIS — M25562 Pain in left knee: Secondary | ICD-10-CM

## 2024-06-26 DIAGNOSIS — G43019 Migraine without aura, intractable, without status migrainosus: Secondary | ICD-10-CM

## 2024-06-26 DIAGNOSIS — K219 Gastro-esophageal reflux disease without esophagitis: Secondary | ICD-10-CM

## 2024-06-26 DIAGNOSIS — R202 Paresthesia of skin: Secondary | ICD-10-CM

## 2024-06-26 LAB — COMPREHENSIVE METABOLIC PANEL WITH GFR
AG Ratio: 1.7 (calc) (ref 1.0–2.5)
ALT: 19 U/L (ref 6–29)
AST: 15 U/L (ref 10–30)
Albumin: 4.4 g/dL (ref 3.6–5.1)
Alkaline phosphatase (APISO): 80 U/L (ref 31–125)
BUN: 7 mg/dL (ref 7–25)
CO2: 25 mmol/L (ref 20–32)
Calcium: 9.4 mg/dL (ref 8.6–10.2)
Chloride: 105 mmol/L (ref 98–110)
Creat: 0.7 mg/dL (ref 0.50–0.97)
Globulin: 2.6 g/dL (ref 1.9–3.7)
Glucose, Bld: 103 mg/dL — ABNORMAL HIGH (ref 65–99)
Potassium: 3.9 mmol/L (ref 3.5–5.3)
Sodium: 138 mmol/L (ref 135–146)
Total Bilirubin: 0.3 mg/dL (ref 0.2–1.2)
Total Protein: 7 g/dL (ref 6.1–8.1)
eGFR: 114 mL/min/1.73m2 (ref >=60)

## 2024-06-26 LAB — CBC
HCT: 44.4 % (ref 35.0–45.0)
Hemoglobin: 14.5 g/dL (ref 11.7–15.5)
MCH: 30 pg (ref 27.0–33.0)
MCHC: 32.7 g/dL (ref 32.0–36.0)
MCV: 91.7 fL (ref 80.0–100.0)
MPV: 11.1 fL (ref 7.5–12.5)
Platelets: 421 Thousand/uL — ABNORMAL HIGH (ref 140–400)
RBC: 4.84 Million/uL (ref 3.80–5.10)
RDW: 13 % (ref 11.0–15.0)
WBC: 8.2 Thousand/uL (ref 3.8–10.8)

## 2024-06-26 LAB — LIPID PANEL
Cholesterol: 163 mg/dL (ref <200)
HDL: 41 mg/dL — ABNORMAL LOW (ref >=50)
LDL Cholesterol (Calc): 89 mg/dL
Non-HDL Cholesterol (Calc): 122 mg/dL (ref <130)
Total CHOL/HDL Ratio: 4 (calc) (ref <5.0)
Triglycerides: 248 mg/dL — ABNORMAL HIGH (ref <150)

## 2024-06-26 LAB — HEMOGLOBIN A1C
Hgb A1c MFr Bld: 5.8 % — ABNORMAL HIGH (ref <5.7)
Mean Plasma Glucose: 120 mg/dL
eAG (mmol/L): 6.6 mmol/L

## 2024-06-26 LAB — TSH: TSH: 1.12 m[IU]/L

## 2024-06-26 LAB — VITAMIN B12: Vitamin B-12: 177 pg/mL — ABNORMAL LOW (ref 200–1100)

## 2024-06-26 MED ORDER — OMEPRAZOLE 20 MG PO CPDR: 20.0000 mg | DELAYED_RELEASE_CAPSULE | Freq: Every day | ORAL | 0 refills | Status: DC

## 2024-06-26 MED ORDER — ROSUVASTATIN CALCIUM 5 MG PO TABS: 5.0000 mg | ORAL_TABLET | Freq: Every day | ORAL | 1 refills | Status: DC

## 2024-06-26 MED ORDER — LISINOPRIL-HYDROCHLOROTHIAZIDE 10-12.5 MG PO TABS: 1.0000 | ORAL_TABLET | Freq: Every day | ORAL | 1 refills | Status: DC

## 2024-06-26 NOTE — Assessment & Plan Note (Addendum)
 Complicated by morbid obesity Encourage weight loss as this can help reduce reflux symptoms Continue omeprazole  20 mg every other day

## 2024-06-26 NOTE — Assessment & Plan Note (Signed)
 Complicated by morbid obesity Encourage weight loss as this can help reduce joint pain Continue tylenol  OTC as needed

## 2024-06-26 NOTE — Assessment & Plan Note (Signed)
 Complicated by morbid obesity C-Met and lipid profile today Encouraged her to consume a low-fat diet Continue rosuvastatin  5 mg daily

## 2024-06-26 NOTE — Assessment & Plan Note (Signed)
 Continue valacyclovir  1000 mg daily for 5 days as needed for breakout

## 2024-06-26 NOTE — Assessment & Plan Note (Signed)
 CBC today.

## 2024-06-26 NOTE — Assessment & Plan Note (Signed)
 Encouraged diet and exercise for weight loss ?

## 2024-06-26 NOTE — Assessment & Plan Note (Signed)
 Complicated by morbid obesity Encouraged regular stretching and core strengthening Continue tylenol  OTC as needed

## 2024-06-26 NOTE — Assessment & Plan Note (Signed)
 Avoid triggers Continue Tylenol  and ibuprofen  OTC as needed

## 2024-06-26 NOTE — Assessment & Plan Note (Signed)
 Encourage high-fiber diet and adequate water  intake Continue imodium OTC as needed

## 2024-06-26 NOTE — Assessment & Plan Note (Addendum)
 Complicated by morbid obesity Controlled on lisinopril  HCT 10-12.5 mg daily Reinforced DASH diet and exercise for weight loss C-Met today

## 2024-06-26 NOTE — Progress Notes (Signed)
 Subjective:    Patient ID: Brandi [name], female    DOB: March 13, 1986, 38 y.o.   MRN: [id number]  HPI  Patient presents to clinic today for 72-month follow-up of chronic conditions.  HTN: Her BP today is 124/74.  She is taking lisinopril  HCT as prescribed.  ECG from 12/2022 reviewed.  HLD: Her last LDL was 89, triglycerides 854, 03/2024.  She denies myalgias on rosuvastatin .  She tries to consume a low-fat diet.  Prediabetes: Her last A1c was 6.3 %, 12/2023.  She is not taking any oral diabetic medication at this time.  She does not check her sugars.  Migraines: These occur 1 x month.  Triggered by stress and lack of sleep.  She takes tylenol  and ibuprofen  as needed with good relief of symptoms.  She does not follow with neurology.  IBS: She reports mainly diarrhea.  She takes imodium as needed with good relief of symptoms.  Colonoscopy from 12/2020 reviewed.  She follows with GI.  GERD: She is not sure what triggers this.  She denies breakthrough on omeprazole  (every other day).  Upper GI from 12/2020 reviewed.  Depression: She is not currently taking any medications for this.  She is not currently seeing a therapist.  She denies anxiety, SI/HI.  Chronic pain syndrome: Mainly in her back and knees.  She takes tylenol  as needed with good relief of symptoms.  She does not follow with orthopedics or pain management.  Thrombocytosis: Her last platelet count was 418, 12/2023.  She does not follow with hematology.  Genital herpes: Managed with valacyclovir  as needed for outbreaks.  Review of Systems  Past Medical History:  Diagnosis Date   Abnormal Pap smear of cervix 12/2015   ascus/pos- achd   Allergies    Arthritis    Asthma    Chlamydial cervicitis    Depression    pt has past treatment w/ Zoloft - good symptom control, but worsening IBS symptoms   Genital herpes 02/03/2021   [ ]  valtrex  at 34-36wks   Genital HSV    GERD (gastroesophageal reflux disease)    Heart murmur     Hypertension    IBS (irritable bowel syndrome)    Prediabetes 01/19/2022   [ ]  early GTT   Pyelonephritis    Thrombocytosis 07/20/2021   Trichomoniasis     Current Outpatient Medications  Medication Sig Dispense Refill   albuterol  (VENTOLIN  HFA) 108 (90 Base) MCG/ACT inhaler Inhale 2 puffs into the lungs every 6 (six) hours as needed for wheezing or shortness of breath. 8 g 0   clotrimazole -betamethasone  (LOTRISONE ) cream Apply topically 2 (two) times daily. Apply 1-2 times a day for worsening flare dry skin dermatitis of toes/feet, may re-use daily up to 1 week as needed. 30 g 0   fluconazole  (DIFLUCAN ) 150 MG tablet Take 150 mg by mouth every 3 (three) days.     ibuprofen  (ADVIL ) 600 MG tablet Take 1 tablet (600 mg total) by mouth every 6 (six) hours as needed. 30 tablet 0   lisinopril -hydrochlorothiazide  (ZESTORETIC ) 10-12.5 MG tablet Take 1 tablet by mouth daily. 90 tablet 1   omeprazole  (PRILOSEC) 20 MG capsule Take 1 capsule (20 mg total) by mouth daily. 90 capsule 0   rosuvastatin  (CRESTOR ) 5 MG tablet Take 1 tablet (5 mg total) by mouth daily. 90 tablet 1   valACYclovir  (VALTREX ) 1000 MG tablet Take 1 tablet (1,000 mg total) by mouth daily. Take for 5 days 5 tablet 2   WEGOVY  0.25 MG/0.5ML  SOAJ SQ injection ADMINISTER 0.25 MG UNDER THE SKIN 1 TIME A WEEK 2 mL 2   No current facility-administered medications for this visit.    Allergies  Allergen Reactions   Viberzi  [Eluxadoline ] Itching   Amoxicillin Nausea And Vomiting and Rash    Family History  Problem Relation Age of Onset   Diabetes Mother    Hypertension Mother    Asthma Father    Heart disease Father    Heart attack Father    Asthma Sister    Hypertension Sister    ADD / ADHD Son    Cancer Maternal Aunt    Cancer Maternal Uncle    Cancer Maternal Grandmother    Lung cancer Maternal Grandmother    Heart disease Maternal Grandfather    Lung cancer Paternal Grandmother    Heart disease Paternal Grandfather     Birth defects Neg Hx    Stroke Neg Hx     Social History   Socioeconomic History   Marital status: Single    Spouse name: Not on file   Number of children: 1   Years of education: Not on file   Highest education level: Some college, no degree  Occupational History   Occupation: Horticulturist, commercial    Comment: [employer]  Tobacco Use   Smoking status: Every Day    Current packs/day: 0.00    Average packs/day: 1 pack/day for 12.0 years (12.0 ttl pk-yrs)    Types: Cigarettes    Start date: 12/07/2009    Last attempt to quit: 12/07/2021    Years since quitting: 2.5    Passive exposure: Past   Smokeless tobacco: Never  Vaping Use   Vaping status: Former   Substances: Nicotine, Flavoring  Substance and Sexual Activity   Alcohol use: Not Currently    Alcohol/week: 1.0 standard drink of alcohol    Types: 1 Glasses of wine per week    Comment: rare   Drug use: Not Currently    Frequency: 7.0 times per week    Types: Marijuana   Sexual activity: Not Currently  Other Topics Concern   Not on file  Social History Narrative   Not on file   Social Drivers of Health   Financial Resource Strain: Low Risk  (10/22/2018)   Overall Financial Resource Strain (CARDIA)    Difficulty of Paying Living Expenses: Not very hard  Food Insecurity: No Food Insecurity (07/07/2022)   Hunger Vital Sign    Worried About Running Out of Food in the Last Year: Never true    Ran Out of Food in the Last Year: Never true  Transportation Needs: No Transportation Needs (07/13/2022)   Received from [facility]   PRAPARE - Transportation    In the past 12 months, has lack of transportation kept you from medical appointments or from getting medications?: No    Lack of Transportation (Non-Medical): No  Physical Activity: Not on file  Stress: Not on file  Social Connections: Not on file  Intimate Partner Violence: Not At Risk (07/07/2022)   Humiliation, Afraid, Rape, and Kick questionnaire     Fear of Current or Ex-Partner: No    Emotionally Abused: No    Physically Abused: No    Sexually Abused: No     Constitutional: Patient reports intermittent headaches.  Denies fever, malaise, fatigue, or abrupt weight changes.  HEENT: Denies eye pain, eye redness, ear pain, ringing in the ears, wax buildup, runny nose, nasal congestion, bloody nose, or sore  throat. Respiratory: Denies difficulty breathing, shortness of breath, cough or sputum production.   Cardiovascular: Denies chest pain, chest tightness, palpitations or swelling in the hands or feet.  Gastrointestinal: Patient reports intermittent diarrhea.  Denies abdominal pain, bloating, constipation, or blood in the stool.  GU: Denies urgency, frequency, pain with urination, burning sensation, blood in urine, odor or discharge. Musculoskeletal: Patient reports chronic joint pain.  Denies decrease in range of motion, difficulty with gait, muscle pain or joint swelling.  Skin: Denies redness, rash, lesions or ulcercations.  Neurological: Patient reports paresthesia of hands.  Denies dizziness, difficulty with memory, difficulty with speech or problems with balance and coordination.  Psych: Patient has a history of depression.  Denies anxiety, SI/HI.  No other specific complaints in a complete review of systems (except as listed in HPI above).     Objective:   Physical Exam  BP 124/74 (BP Location: Left Arm, Patient Position: Sitting, Cuff Size: Normal)   Ht 5' 6 (1.676 m)   Wt 220 lb (99.8 kg)   LMP 09/27/2022   BMI 35.51 kg/m    Wt Readings from Last 3 Encounters:  12/26/23 241 lb 6.4 oz (109.5 kg)  12/23/23 243 lb (110.2 kg)  12/12/23 241 lb 6.4 oz (109.5 kg)    General: Appears her stated age, obese, in NAD. Skin: Warm, dry and intact.  HEENT: Head: normal shape and size; Eyes: sclera white, no icterus, conjunctiva pink, PERRLA and EOMs intact;  Cardiovascular:Tachycardic with normal rhythm. S1,S2 noted.  No murmur,  rubs or gallops noted. No JVD or BLE edema.  Pulmonary/Chest: Normal effort and positive vesicular breath sounds. No respiratory distress. No wheezes, rales or ronchi noted.  Abdomen: Normal bowel sounds. Musculoskeletal: Strength 5/5 BUE/BLE. No difficulty with gait.  Neurological: Alert and oriented.  Negative Tinel's and Phalen's bilaterally.  Cranial nerves II-XII grossly intact. Coordination normal.  Psychiatric: Mood and affect normal. Behavior is normal. Judgment and thought content normal.    BMET    Component Value Date/Time   NA 140 03/27/2024 1440   K 3.8 03/27/2024 1440   CL 104 03/27/2024 1440   CO2 25 03/27/2024 1440   GLUCOSE 93 03/27/2024 1440   BUN 6 (L) 03/27/2024 1440   CREATININE 0.65 03/27/2024 1440   CALCIUM  9.5 03/27/2024 1440   GFRNONAA >60 12/11/2022 0834   GFRNONAA 112 05/30/2020 1051   GFRAA 130 05/30/2020 1051    Lipid Panel     Component Value Date/Time   CHOL 154 03/27/2024 1440   TRIG 145 03/27/2024 1440   HDL 40 (L) 03/27/2024 1440   CHOLHDL 3.9 03/27/2024 1440   LDLCALC 89 03/27/2024 1440    CBC    Component Value Date/Time   WBC 8.8 12/26/2023 1447   RBC 4.64 12/26/2023 1447   HGB 13.4 12/26/2023 1447   HGB 13.4 10/02/2022 1201   HCT 40.1 12/26/2023 1447   HCT 40.6 10/02/2022 1201   PLT 418 (H) 12/26/2023 1447   PLT 520 (H) 10/02/2022 1201   MCV 86.4 12/26/2023 1447   MCV 88 10/02/2022 1201   MCH 28.9 12/26/2023 1447   MCHC 33.4 12/26/2023 1447   RDW 14.5 12/26/2023 1447   RDW 13.4 10/02/2022 1201   LYMPHSABS 2.8 07/06/2022 2341   LYMPHSABS 2.1 02/22/2022 1520   MONOABS 1.0 07/06/2022 2341   EOSABS 0.4 07/06/2022 2341   EOSABS 0.1 02/22/2022 1520   BASOSABS 0.0 07/06/2022 2341   BASOSABS 0.0 02/22/2022 1520    Hgb A1C Lab Results  Component Value Date   HGBA1C 6.3 (H) 12/26/2023           Assessment & Plan:     RTC in 6 months for your annual exam [place] Laura, NP

## 2024-06-26 NOTE — Assessment & Plan Note (Signed)
 Complicated by morbid obesity A1c today Encourage low-carb diet and exercise for weight loss

## 2024-06-26 NOTE — Patient Instructions (Signed)

## 2024-06-26 NOTE — Assessment & Plan Note (Signed)
Stable off meds Support offered 

## 2024-06-29 ENCOUNTER — Ambulatory Visit: Payer: Self-pay | Admitting: Internal Medicine

## 2024-06-30 ENCOUNTER — Encounter: Payer: Self-pay | Admitting: Internal Medicine

## 2024-09-11 ENCOUNTER — Encounter: Payer: Self-pay | Admitting: Internal Medicine

## 2024-09-29 ENCOUNTER — Encounter: Payer: Self-pay | Admitting: Internal Medicine

## 2024-09-30 ENCOUNTER — Other Ambulatory Visit: Payer: Self-pay | Admitting: Internal Medicine

## 2024-09-30 DIAGNOSIS — K219 Gastro-esophageal reflux disease without esophagitis: Secondary | ICD-10-CM

## 2024-10-01 NOTE — Telephone Encounter (Signed)
 Requested Prescriptions  Pending Prescriptions Disp Refills   omeprazole  (PRILOSEC) 20 MG capsule [Pharmacy Med Name: OMEPRAZOLE  20MG  CAPSULES] 90 capsule 0    Sig: TAKE 1 CAPSULE(20 MG) BY MOUTH DAILY     Gastroenterology: Proton Pump Inhibitors Passed - 10/01/2024 12:14 PM      Passed - Valid encounter within last 12 months    Recent Outpatient Visits           3 months ago Mixed hyperlipidemia   [place] [facility] [place], Angeline [place], NP   9 months ago Encounter for general adult medical examination with abnormal findings   [facility] [place], Angeline ORN, NP   9 months ago Mild intermittent extrinsic asthma with acute exacerbation   [place] [facility] [place], Angeline ORN, [place]

## 2024-10-02 ENCOUNTER — Other Ambulatory Visit: Payer: Self-pay

## 2024-10-02 ENCOUNTER — Ambulatory Visit (INDEPENDENT_AMBULATORY_CARE_PROVIDER_SITE_OTHER): Admitting: Internal Medicine

## 2024-10-02 ENCOUNTER — Encounter: Payer: Self-pay | Admitting: Internal Medicine

## 2024-10-02 VITALS — BP 122/74 | Ht 66.0 in | Wt 226.2 lb

## 2024-10-02 DIAGNOSIS — K130 Diseases of lips: Secondary | ICD-10-CM

## 2024-10-02 MED ORDER — ROSUVASTATIN CALCIUM 5 MG PO TABS: 5.0000 mg | ORAL_TABLET | Freq: Every day | ORAL | 1 refills | Status: AC

## 2024-10-02 MED ORDER — LISINOPRIL-HYDROCHLOROTHIAZIDE 10-12.5 MG PO TABS: 1.0000 | ORAL_TABLET | Freq: Every day | ORAL | 1 refills | Status: AC

## 2024-10-02 MED ORDER — KETOCONAZOLE 2 % EX CREA: 1.0000 | TOPICAL_CREAM | Freq: Every day | CUTANEOUS | 0 refills | Status: AC

## 2024-10-02 NOTE — Progress Notes (Signed)
 "  Subjective:    Patient ID: Brandi [name], female    DOB: 1986-08-21, 39 y.o.   MRN: [id number]  HPI  Discussed the use of AI scribe software for clinical note transcription with the patient, who gave verbal consent to proceed.  Brandi [name] is a 39 year old female who presents with recurrent angular chelitis.  She experiences recurrent cuts on her mouth, which tend to open up and worsen after initially healing. This issue occurs at least once a year, typically during the winter months.  She has been using Lotrisone  cream for treatment, which usually resolves the issue temporarily, but the problem recurs. She frequently applies Vaseline to her lips, finding it more effective than Chapstick. Despite these efforts, the cuts still occur.  No honey-colored crusting is present; the patient reports the crust is always white. She denies frequent lip licking but mentions biting her lip ring occasionally. She has not noticed any other contributing factors to her condition.       Review of Systems  Past Medical History:  Diagnosis Date   Abnormal Pap smear of cervix 12/2015   ascus/pos- achd   Allergies    Arthritis    Asthma    Chlamydial cervicitis    Depression    pt has past treatment w/ Zoloft - good symptom control, but worsening IBS symptoms   Genital herpes 02/03/2021   [ ]  valtrex  at 34-36wks   Genital HSV    GERD (gastroesophageal reflux disease)    Heart murmur    Hypertension    IBS (irritable bowel syndrome)    Prediabetes 01/19/2022   [ ]  early GTT   Pyelonephritis    Thrombocytosis 07/20/2021   Trichomoniasis     Current Outpatient Medications  Medication Sig Dispense Refill   clotrimazole -betamethasone  (LOTRISONE ) cream Apply topically 2 (two) times daily. Apply 1-2 times a day for worsening flare dry skin dermatitis of toes/feet, may re-use daily up to 1 week as needed. 30 g 0   fluconazole  (DIFLUCAN ) 150 MG tablet Take 150 mg by mouth every 3 (three)  days. (Patient taking differently: Take 150 mg by mouth as needed.)     ibuprofen  (ADVIL ) 600 MG tablet Take 1 tablet (600 mg total) by mouth every 6 (six) hours as needed. 30 tablet 0   lisinopril -hydrochlorothiazide  (ZESTORETIC ) 10-12.5 MG tablet Take 1 tablet by mouth daily. 90 tablet 1   omeprazole  (PRILOSEC) 20 MG capsule TAKE 1 CAPSULE(20 MG) BY MOUTH DAILY 90 capsule 0   rosuvastatin  (CRESTOR ) 5 MG tablet Take 1 tablet (5 mg total) by mouth daily. 90 tablet 1   valACYclovir  (VALTREX ) 1000 MG tablet Take 1 tablet (1,000 mg total) by mouth daily. Take for 5 days 5 tablet 2   No current facility-administered medications for this visit.    Allergies  Allergen Reactions   Viberzi  [Eluxadoline ] Itching   Amoxicillin Nausea And Vomiting and Rash    Family History  Problem Relation Age of Onset   Diabetes Mother    Hypertension Mother    Asthma Father    Heart disease Father    Heart attack Father    Asthma Sister    Hypertension Sister    ADD / ADHD Son    Cancer Maternal Aunt    Cancer Maternal Uncle    Cancer Maternal Grandmother    Lung cancer Maternal Grandmother    Heart disease Maternal Grandfather    Lung cancer Paternal Grandmother    Heart disease Paternal Grandfather  Birth defects Neg Hx    Stroke Neg Hx     Social History   Socioeconomic History   Marital status: Single    Spouse name: Not on file   Number of children: 1   Years of education: Not on file   Highest education level: Some college, no degree  Occupational History   Occupation: Horticulturist, Commercial    Comment: [employer]  Tobacco Use   Smoking status: Every Day    Current packs/day: 0.00    Average packs/day: 1 pack/day for 12.0 years (12.0 ttl pk-yrs)    Types: Cigarettes    Start date: 12/07/2009    Last attempt to quit: 12/07/2021    Years since quitting: 2.8    Passive exposure: Past   Smokeless tobacco: Never  Vaping Use   Vaping status: Former   Substances: Nicotine, Flavoring   Substance and Sexual Activity   Alcohol use: Not Currently    Alcohol/week: 1.0 standard drink of alcohol    Types: 1 Glasses of wine per week    Comment: rare   Drug use: Not Currently    Frequency: 7.0 times per week    Types: Marijuana   Sexual activity: Not Currently  Other Topics Concern   Not on file  Social History Narrative   Not on file   Social Drivers of Health   Tobacco Use: High Risk (06/26/2024)   Patient History    Smoking Tobacco Use: Every Day    Smokeless Tobacco Use: Never    Passive Exposure: Past  Financial Resource Strain: Not on file  Food Insecurity: No Food Insecurity (07/07/2022)   Hunger Vital Sign    Worried About Radiation Protection Practitioner of Food in the Last Year: Never true    Ran Out of Food in the Last Year: Never true  Transportation Needs: No Transportation Needs (07/13/2022)   Received from [facility]   PRAPARE - Transportation    In the past 12 months, has lack of transportation kept you from medical appointments or from getting medications?: No    Lack of Transportation (Non-Medical): No  Physical Activity: Not on file  Stress: Not on file  Social Connections: Not on file  Intimate Partner Violence: Not At Risk (07/07/2022)   Humiliation, Afraid, Rape, and Kick questionnaire    Fear of Current or Ex-Partner: No    Emotionally Abused: No    Physically Abused: No    Sexually Abused: No  Depression (PHQ2-9): Low Risk (06/26/2024)   Depression (PHQ2-9)    PHQ-2 Score: 0  Alcohol Screen: Low Risk (04/29/2023)   Alcohol Screen    Last Alcohol Screening Score (AUDIT): 3  Housing: Low Risk (07/07/2022)   Housing    Last Housing Risk Score: 0  Utilities: Not At Risk (07/07/2022)   AHC Utilities    Threatened with loss of utilities: No  Health Literacy: Not on file     Constitutional: Patient reports intermittent headaches.  Denies fever, malaise, fatigue, or abrupt weight changes.  HEENT: Pt reports rash to corner of mouth. Denies  eye pain, eye redness, ear pain, ringing in the ears, wax buildup, runny nose, nasal congestion, bloody nose, or sore throat. Respiratory: Denies difficulty breathing, shortness of breath, cough or sputum production.   Cardiovascular: Denies chest pain, chest tightness, palpitations or swelling in the hands or feet.   No other specific complaints in a complete review of systems (except as listed in HPI above).     Objective:  Physical Exam  BP 122/74 (BP Location: Left Arm, Patient Position: Sitting, Cuff Size: Large)   Ht 5' 6 (1.676 m)   Wt 226 lb 3.2 oz (102.6 kg)   LMP 09/27/2022   BMI 36.51 kg/m     Wt Readings from Last 3 Encounters:  06/26/24 220 lb (99.8 kg)  12/26/23 241 lb 6.4 oz (109.5 kg)  12/23/23 243 lb (110.2 kg)    General: Appears her stated age, obese, in NAD. Skin: Warm, dry and intact.  HEENT: Head: normal shape and size; Eyes: sclera white, no icterus, conjunctiva pink, PERRLA and EOMs intact; Mouth: No angular chelitis present on todays exam. Cardiovascular:Tachycardic with normal rhythm. S1,S2 noted.  No murmur, rubs or gallops noted. No JVD or BLE edema.  Pulmonary/Chest: Normal effort and positive vesicular breath sounds. No respiratory distress. No wheezes, rales or ronchi noted.    BMET    Component Value Date/Time   NA 138 06/26/2024 1446   K 3.9 06/26/2024 1446   CL 105 06/26/2024 1446   CO2 25 06/26/2024 1446   GLUCOSE 103 (H) 06/26/2024 1446   BUN 7 06/26/2024 1446   CREATININE 0.70 06/26/2024 1446   CALCIUM  9.4 06/26/2024 1446   GFRNONAA >60 12/11/2022 0834   GFRNONAA 112 05/30/2020 1051   GFRAA 130 05/30/2020 1051    Lipid Panel     Component Value Date/Time   CHOL 163 06/26/2024 1446   TRIG 248 (H) 06/26/2024 1446   HDL 41 (L) 06/26/2024 1446   CHOLHDL 4.0 06/26/2024 1446   LDLCALC 89 06/26/2024 1446    CBC    Component Value Date/Time   WBC 8.2 06/26/2024 1446   RBC 4.84 06/26/2024 1446   HGB 14.5 06/26/2024 1446    HGB 13.4 10/02/2022 1201   HCT 44.4 06/26/2024 1446   HCT 40.6 10/02/2022 1201   PLT 421 (H) 06/26/2024 1446   PLT 520 (H) 10/02/2022 1201   MCV 91.7 06/26/2024 1446   MCV 88 10/02/2022 1201   MCH 30.0 06/26/2024 1446   MCHC 32.7 06/26/2024 1446   RDW 13.0 06/26/2024 1446   RDW 13.4 10/02/2022 1201   LYMPHSABS 2.8 07/06/2022 2341   LYMPHSABS 2.1 02/22/2022 1520   MONOABS 1.0 07/06/2022 2341   EOSABS 0.4 07/06/2022 2341   EOSABS 0.1 02/22/2022 1520   BASOSABS 0.0 07/06/2022 2341   BASOSABS 0.0 02/22/2022 1520    Hgb A1C Lab Results  Component Value Date   HGBA1C 5.8 (H) 06/26/2024           Assessment & Plan:   Assessment and Plan    Recurrent angular cheilitis Recurrent angular cheilitis, likely fungal, with white lesions suggesting yeast. Previous Lotrisone  cream ineffective. - Prescribed ketoconazole  cream (Nizoral  2% cream) daily prn. - Advised to monitor response and report effectiveness.      RTC in 3 months for your annual exam Angeline Laura, NP  "

## 2024-12-31 ENCOUNTER — Encounter: Admitting: Internal Medicine
